# Patient Record
Sex: Female | Born: 1970
Health system: Southern US, Community
[De-identification: ages and names within clinical notes are randomized; demographics above are authoritative.]

## PROBLEM LIST (undated history)

## (undated) DIAGNOSIS — J309 Allergic rhinitis, unspecified: Secondary | ICD-10-CM

## (undated) DIAGNOSIS — K5900 Constipation, unspecified: Secondary | ICD-10-CM

## (undated) DIAGNOSIS — J45909 Unspecified asthma, uncomplicated: Secondary | ICD-10-CM

## (undated) DIAGNOSIS — IMO0001 Reserved for inherently not codable concepts without codable children: Secondary | ICD-10-CM

## (undated) DIAGNOSIS — F419 Anxiety disorder, unspecified: Secondary | ICD-10-CM

## (undated) DIAGNOSIS — K219 Gastro-esophageal reflux disease without esophagitis: Secondary | ICD-10-CM

## (undated) DIAGNOSIS — J449 Chronic obstructive pulmonary disease, unspecified: Secondary | ICD-10-CM

## (undated) DIAGNOSIS — R319 Hematuria, unspecified: Secondary | ICD-10-CM

## (undated) DIAGNOSIS — G4700 Insomnia, unspecified: Secondary | ICD-10-CM

## (undated) DIAGNOSIS — F329 Major depressive disorder, single episode, unspecified: Secondary | ICD-10-CM

## (undated) DIAGNOSIS — F32A Depression, unspecified: Secondary | ICD-10-CM

## (undated) HISTORY — DX: Insomnia, unspecified: G47.00

## (undated) HISTORY — PX: OTHER SURGICAL HISTORY: SHX169

## (undated) HISTORY — PX: TUBAL LIGATION: SHX77

## (undated) HISTORY — PX: TONSILLECTOMY: SHX5217

## (undated) HISTORY — DX: Anxiety disorder, unspecified: F41.9

## (undated) HISTORY — DX: Allergic rhinitis, unspecified: J30.9

## (undated) HISTORY — DX: Reserved for inherently not codable concepts without codable children: IMO0001

## (undated) HISTORY — PX: APPENDECTOMY: SHX54

## (undated) HISTORY — DX: Constipation, unspecified: K59.00

## (undated) HISTORY — DX: Depression, unspecified: F32.A

## (undated) HISTORY — DX: Unspecified asthma, uncomplicated: J45.909

## (undated) HISTORY — PX: ABDOMINAL HYSTERECTOMY: SHX81

## (undated) HISTORY — DX: Gastro-esophageal reflux disease without esophagitis: K21.9

## (undated) HISTORY — DX: Hematuria, unspecified: R31.9

## (undated) HISTORY — DX: Major depressive disorder, single episode, unspecified: F32.9

---

## 2001-12-25 HISTORY — PX: OVARIAN CYST DRAINAGE: SHX325

## 2004-04-19 ENCOUNTER — Ambulatory Visit: Payer: Self-pay | Admitting: Gastroenterology

## 2006-04-10 ENCOUNTER — Ambulatory Visit: Payer: Self-pay | Admitting: Internal Medicine

## 2006-04-17 ENCOUNTER — Ambulatory Visit: Payer: Self-pay | Admitting: Internal Medicine

## 2006-10-12 ENCOUNTER — Ambulatory Visit: Payer: Self-pay | Admitting: Unknown Physician Specialty

## 2006-10-15 ENCOUNTER — Ambulatory Visit: Payer: Self-pay | Admitting: Unknown Physician Specialty

## 2006-10-27 ENCOUNTER — Observation Stay: Payer: Self-pay | Admitting: Unknown Physician Specialty

## 2007-05-25 ENCOUNTER — Ambulatory Visit: Payer: Self-pay | Admitting: Internal Medicine

## 2008-01-14 ENCOUNTER — Ambulatory Visit: Payer: Self-pay | Admitting: Internal Medicine

## 2008-11-16 ENCOUNTER — Ambulatory Visit: Payer: Self-pay | Admitting: Unknown Physician Specialty

## 2009-10-15 ENCOUNTER — Ambulatory Visit: Payer: Self-pay | Admitting: Gastroenterology

## 2009-10-15 LAB — HM COLONOSCOPY

## 2009-11-20 ENCOUNTER — Ambulatory Visit: Payer: Self-pay | Admitting: Unknown Physician Specialty

## 2011-01-29 ENCOUNTER — Ambulatory Visit: Payer: Self-pay | Admitting: Unknown Physician Specialty

## 2011-01-31 ENCOUNTER — Ambulatory Visit: Payer: Self-pay | Admitting: Unknown Physician Specialty

## 2012-02-20 ENCOUNTER — Ambulatory Visit: Payer: Self-pay | Admitting: Obstetrics and Gynecology

## 2012-03-04 ENCOUNTER — Ambulatory Visit: Payer: Self-pay | Admitting: Obstetrics and Gynecology

## 2012-03-04 LAB — CBC
MCH: 31.2 pg (ref 26.0–34.0)
MCHC: 33.8 g/dL (ref 32.0–36.0)
MCV: 92 fL (ref 80–100)
RBC: 4.26 10*6/uL (ref 3.80–5.20)
RDW: 12.9 % (ref 11.5–14.5)
WBC: 5.6 10*3/uL (ref 3.6–11.0)

## 2012-03-04 LAB — BASIC METABOLIC PANEL
BUN: 11 mg/dL (ref 7–18)
Calcium, Total: 9.5 mg/dL (ref 8.5–10.1)
Co2: 26 mmol/L (ref 21–32)
Glucose: 89 mg/dL (ref 65–99)
Osmolality: 275 (ref 275–301)
Sodium: 138 mmol/L (ref 136–145)

## 2012-03-12 ENCOUNTER — Ambulatory Visit: Payer: Self-pay | Admitting: Obstetrics and Gynecology

## 2012-03-16 LAB — PATHOLOGY REPORT

## 2013-02-23 ENCOUNTER — Ambulatory Visit: Payer: Self-pay | Admitting: Obstetrics and Gynecology

## 2013-11-21 ENCOUNTER — Ambulatory Visit: Payer: Self-pay | Admitting: Family Medicine

## 2014-09-28 ENCOUNTER — Other Ambulatory Visit: Payer: Self-pay | Admitting: Family Medicine

## 2014-09-29 NOTE — Telephone Encounter (Signed)
Routing to provider  

## 2014-09-30 ENCOUNTER — Other Ambulatory Visit: Payer: Self-pay | Admitting: Family Medicine

## 2014-10-09 ENCOUNTER — Other Ambulatory Visit: Payer: Self-pay | Admitting: Unknown Physician Specialty

## 2014-10-10 NOTE — Telephone Encounter (Signed)
Your patient.  Thanks 

## 2014-10-10 NOTE — Telephone Encounter (Signed)
Appt please, last visit was March 2016; 6 month f/u will be due soon

## 2014-11-14 DIAGNOSIS — F32A Depression, unspecified: Secondary | ICD-10-CM | POA: Insufficient documentation

## 2014-11-14 DIAGNOSIS — K5900 Constipation, unspecified: Secondary | ICD-10-CM | POA: Insufficient documentation

## 2014-11-14 DIAGNOSIS — J309 Allergic rhinitis, unspecified: Secondary | ICD-10-CM | POA: Insufficient documentation

## 2014-11-14 DIAGNOSIS — K219 Gastro-esophageal reflux disease without esophagitis: Secondary | ICD-10-CM | POA: Insufficient documentation

## 2014-11-14 DIAGNOSIS — G4700 Insomnia, unspecified: Secondary | ICD-10-CM | POA: Insufficient documentation

## 2014-11-14 DIAGNOSIS — F419 Anxiety disorder, unspecified: Secondary | ICD-10-CM | POA: Insufficient documentation

## 2014-11-14 DIAGNOSIS — F329 Major depressive disorder, single episode, unspecified: Secondary | ICD-10-CM | POA: Insufficient documentation

## 2014-11-14 DIAGNOSIS — R319 Hematuria, unspecified: Secondary | ICD-10-CM | POA: Insufficient documentation

## 2014-11-14 DIAGNOSIS — J45909 Unspecified asthma, uncomplicated: Secondary | ICD-10-CM | POA: Insufficient documentation

## 2014-11-16 ENCOUNTER — Encounter: Payer: Self-pay | Admitting: Family Medicine

## 2014-11-16 ENCOUNTER — Ambulatory Visit (INDEPENDENT_AMBULATORY_CARE_PROVIDER_SITE_OTHER): Payer: BLUE CROSS/BLUE SHIELD | Admitting: Family Medicine

## 2014-11-16 VITALS — BP 115/74 | HR 62 | Temp 97.3°F | Wt 179.0 lb

## 2014-11-16 DIAGNOSIS — J3089 Other allergic rhinitis: Secondary | ICD-10-CM | POA: Diagnosis not present

## 2014-11-16 DIAGNOSIS — G4700 Insomnia, unspecified: Secondary | ICD-10-CM

## 2014-11-16 DIAGNOSIS — F329 Major depressive disorder, single episode, unspecified: Secondary | ICD-10-CM

## 2014-11-16 DIAGNOSIS — E538 Deficiency of other specified B group vitamins: Secondary | ICD-10-CM

## 2014-11-16 DIAGNOSIS — R42 Dizziness and giddiness: Secondary | ICD-10-CM | POA: Diagnosis not present

## 2014-11-16 DIAGNOSIS — J4530 Mild persistent asthma, uncomplicated: Secondary | ICD-10-CM

## 2014-11-16 DIAGNOSIS — F32A Depression, unspecified: Secondary | ICD-10-CM

## 2014-11-16 DIAGNOSIS — F419 Anxiety disorder, unspecified: Secondary | ICD-10-CM | POA: Diagnosis not present

## 2014-11-16 MED ORDER — ESCITALOPRAM OXALATE 10 MG PO TABS: 10.0000 mg | ORAL_TABLET | Freq: Every day | ORAL | Status: DC

## 2014-11-16 MED ORDER — BUPROPION HCL ER (XL) 150 MG PO TB24: 150.0000 mg | ORAL_TABLET | Freq: Every day | ORAL | Status: DC

## 2014-11-16 NOTE — Patient Instructions (Addendum)
Please decrease the gabapentin from two in the evening to one in the evening for one week, then stop Talk with your allergy doctor if allergy symptoms persist Continue the same antidepressant medicines (but we'll keep escitalopram at 10 mg) Never ever drink alcohol within six hours of the lorazepam or Ambien (zolpidem) Return in 6 months, sooner if needed

## 2014-11-16 NOTE — Progress Notes (Signed)
BP 115/74 mmHg  Pulse 62  Temp(Src) 97.3 F (36.3 C)  Wt 179 lb (81.194 kg)  SpO2 99%  Subjective:    Patient ID: [name], female    DOB: 11/13/1970, 44 y.o.   MRN: [id number]  HPI: [name] is a 44 y.o. female  Chief Complaint  Patient presents with  . Depression    needs refills on Wellbutrin and Escitalopram  . Anxiety  . Dizziness    occasionally, would like to have some labs drawn.   She needs refills of her medicines; the combination of the medicine is "great"; she thinks that being on 10 mg of lexapro and continue the same wellbutrin; has been out of the extra 5 mg for 2 months Depression screen [facility] 2/9 11/16/2014  Decreased Interest 0  Down, Depressed, Hopeless 0  PHQ - 2 Score 0  doing pretty well with anxiety  Sleep is okay; she gets ambien from allergy/asthma doctor; she knows that the max recommended dose is 5 mg and she talked to the doctor about it; she takes lorazepam and only uses those maybe once a month, can't remember the last time she used it; like a security blanket; she drinks socially but never with these medicine  She is here for f/u; she has had some sinus issues; ears have stopped up for weeks; takes sudafed and zyrtec; ears are stopped up and ringing; not blowing out yellow stuff; sneezing; taking allergy shots; she thought maybe a few weeks ago she didn't eat soon enough, but it's been doing it regardless; thinks maybe sinus more than eating issues; no fam of diabetes  She does not think the neurontin is working well; only uses it occasionally  B12 low; taking supplement  Relevant past medical, surgical, family and social history reviewed and updated as indicated. Interim medical history since our last visit reviewed. Allergies and medications reviewed and updated.  Review of Systems Per HPI unless specifically indicated above     Objective:    BP 115/74 mmHg  Pulse 62  Temp(Src) 97.3 F (36.3 C)  Wt 179 lb (81.194 kg)   SpO2 99%  Wt Readings from Last 3 Encounters:  11/16/14 179 lb (81.194 kg)  04/26/14 171 lb (77.565 kg)    Physical Exam  Constitutional: She appears well-developed and well-nourished. No distress.  HENT:  Head: Normocephalic and atraumatic.  Eyes: EOM are normal. No scleral icterus.  Neck: No thyromegaly present.  Cardiovascular: Normal rate, regular rhythm and normal heart sounds.   No murmur heard. Pulmonary/Chest: Effort normal and breath sounds normal. No respiratory distress. She has no wheezes.  Abdominal: Soft. She exhibits no distension.  Musculoskeletal: Normal range of motion. She exhibits no edema.  Neurological: She is alert. She exhibits normal muscle tone.  Skin: Skin is warm and dry. She is not diaphoretic. No pallor.  Psychiatric: She has a normal mood and affect. Her behavior is normal. Judgment and thought content normal.    Results for orders placed or performed in visit on 11/14/14  HM COLONOSCOPY  Result Value Ref Range   HM Colonoscopy per PP       Assessment & Plan:   Problem List Items Addressed This Visit      Respiratory   Asthma    F/u with allergy/asthma doctor      Allergic rhinitis    F/u with allergy/asthma doctor        Other   Depression - Primary    Continue SSRI;  well-controlled on just 10 mg now for the last two months; continue that dose; refills provided; f/u in 6 months, sooner if needed      Relevant Medications   buPROPion (WELLBUTRIN XL) 150 MG 24 hr tablet   escitalopram (LEXAPRO) 10 MG tablet   Anxiety    Rare use of benzodiazepine; discussed again with patient that she should not take this medicine within six hours of the Ambien (zolpidem) that her allergy/asthma doctor gives her; risk of unintentional overdose; she knows to use benzo for truly stormy moments, not dealing with life's day to day stresses; limited Rx may be approved before her next appt in 6 months if needed; no concern for misuse or diversion       Relevant Medications   buPROPion (WELLBUTRIN XL) 150 MG 24 hr tablet   escitalopram (LEXAPRO) 10 MG tablet   Insomnia    The Ambien (zolpidem) is prescribed by another provider; I do not prescribe 10 mg for women, and she is aware that the dose she is taking is above the recommended amount; she is aware of risk of unintentional overdose if mixed with other medicines, do NOT take within six hours of any benzo or pain pill      Vitamin B12 deficiency (non anemic)    Supplementation recommended      Dizziness    Reviewed hx, no physical signs to suggest worrisome problem; suggested she talk with her allergy doctor if sinus or allergy issue; call if symptoms persist; reviewed previous labs; we agreed no additional labs needed today          Follow up plan: Return in about 6 months (around 05/16/2015) for mood.  Face-to-face time with patient was more than 25 minutes, >50% time spent counseling and coordination of care  An after-visit summary was printed and given to the patient at [place].  Please see the patient instructions which may contain other information and recommendations beyond what is mentioned above in the assessment and plan. Meds ordered this encounter  Medications  . DISCONTD: Vitamin D, Ergocalciferol, (DRISDOL) 50000 UNITS CAPS capsule    Sig: Take by mouth daily.    Refill:  0  . buPROPion (WELLBUTRIN XL) 150 MG 24 hr tablet    Sig: Take 1 tablet (150 mg total) by mouth daily.    Dispense:  30 tablet    Refill:  6  . escitalopram (LEXAPRO) 10 MG tablet    Sig: Take 1 tablet (10 mg total) by mouth daily.    Dispense:  30 tablet    Refill:  6  Staff entered "50,000 iu vit D daily" in med list; see separate note; patient should NOT be taking that much; not sure if entry error and it's 1,000 iu daily or perhaps 50,000 iu monthly from another provider, but I'll have staff clear that up

## 2014-11-18 ENCOUNTER — Telehealth: Payer: Self-pay | Admitting: Family Medicine

## 2014-11-18 DIAGNOSIS — E538 Deficiency of other specified B group vitamins: Secondary | ICD-10-CM | POA: Insufficient documentation

## 2014-11-18 DIAGNOSIS — R42 Dizziness and giddiness: Secondary | ICD-10-CM | POA: Insufficient documentation

## 2014-11-18 NOTE — Assessment & Plan Note (Signed)
Reviewed hx, no physical signs to suggest worrisome problem; suggested she talk with her allergy doctor if sinus or allergy issue; call if symptoms persist; reviewed previous labs; we agreed no additional labs needed today

## 2014-11-18 NOTE — Assessment & Plan Note (Signed)
Continue SSRI; well-controlled on just 10 mg now for the last two months; continue that dose; refills provided; f/u in 6 months, sooner if needed

## 2014-11-18 NOTE — Assessment & Plan Note (Signed)
Supplementation recommended

## 2014-11-18 NOTE — Telephone Encounter (Signed)
Please call patient and clarify the vitamin D issue (if Rx of 50,000 iu, who is prescribing and how often is she taking it? If OTC, how much and how often?) She should NOT be taking 50,000 iu of vit D daily That was in her med list and I did not catch that until I was finishing my note Saturday That is much too much; vit D can be toxic

## 2014-11-18 NOTE — Assessment & Plan Note (Signed)
The Ambien (zolpidem) is prescribed by another provider; I do not prescribe 10 mg for women, and she is aware that the dose she is taking is above the recommended amount; she is aware of risk of unintentional overdose if mixed with other medicines, do NOT take within six hours of any benzo or pain pill

## 2014-11-18 NOTE — Assessment & Plan Note (Signed)
F/u with allergy/asthma doctor

## 2014-11-18 NOTE — Assessment & Plan Note (Signed)
Rare use of benzodiazepine; discussed again with patient that she should not take this medicine within six hours of the Ambien (zolpidem) that her allergy/asthma doctor gives her; risk of unintentional overdose; she knows to use benzo for truly stormy moments, not dealing with life's day to day stresses; limited Rx may be approved before her next appt in 6 months if needed; no concern for misuse or diversion

## 2014-11-20 NOTE — Telephone Encounter (Signed)
That is a LOT of vitamin D I would suggest she contact her gynecologist (prescribe) and have her level checked Too much vitamin D can be bad and it needs to be monitored; usual daily dose is 1,000 iu daily (7,000 iu weekly)

## 2014-11-20 NOTE — Telephone Encounter (Signed)
I spoke with patient, she is only taking it once a week. She states she has been taking it for a year though. She was getting it thru [place]. I updated it in her med list.

## 2014-11-20 NOTE — Telephone Encounter (Signed)
Patient notified

## 2015-01-25 ENCOUNTER — Other Ambulatory Visit: Payer: Self-pay | Admitting: Obstetrics & Gynecology

## 2015-01-25 DIAGNOSIS — Z1231 Encounter for screening mammogram for malignant neoplasm of breast: Secondary | ICD-10-CM

## 2015-02-02 ENCOUNTER — Ambulatory Visit
Admission: RE | Admit: 2015-02-02 | Discharge: 2015-02-02 | Disposition: A | Discharge status: Home / Self Care | Payer: BLUE CROSS/BLUE SHIELD | Source: Ambulatory Visit | Attending: Obstetrics & Gynecology | Admitting: Obstetrics & Gynecology

## 2015-02-02 DIAGNOSIS — Z1231 Encounter for screening mammogram for malignant neoplasm of breast: Secondary | ICD-10-CM

## 2015-04-30 ENCOUNTER — Other Ambulatory Visit: Payer: Self-pay | Admitting: Internal Medicine

## 2015-04-30 DIAGNOSIS — J329 Chronic sinusitis, unspecified: Secondary | ICD-10-CM

## 2015-05-04 ENCOUNTER — Ambulatory Visit: Payer: BLUE CROSS/BLUE SHIELD | Attending: Internal Medicine

## 2015-05-18 ENCOUNTER — Encounter: Payer: Self-pay | Admitting: Family Medicine

## 2015-05-18 ENCOUNTER — Ambulatory Visit (INDEPENDENT_AMBULATORY_CARE_PROVIDER_SITE_OTHER): Payer: BLUE CROSS/BLUE SHIELD | Admitting: Family Medicine

## 2015-05-18 VITALS — BP 102/70 | HR 75 | Temp 97.0°F | Ht 67.7 in | Wt 179.0 lb

## 2015-05-18 DIAGNOSIS — F419 Anxiety disorder, unspecified: Secondary | ICD-10-CM | POA: Diagnosis not present

## 2015-05-18 DIAGNOSIS — F329 Major depressive disorder, single episode, unspecified: Secondary | ICD-10-CM

## 2015-05-18 DIAGNOSIS — J4530 Mild persistent asthma, uncomplicated: Secondary | ICD-10-CM

## 2015-05-18 DIAGNOSIS — Z79899 Other long term (current) drug therapy: Secondary | ICD-10-CM

## 2015-05-18 DIAGNOSIS — E538 Deficiency of other specified B group vitamins: Secondary | ICD-10-CM

## 2015-05-18 DIAGNOSIS — J3089 Other allergic rhinitis: Secondary | ICD-10-CM | POA: Diagnosis not present

## 2015-05-18 DIAGNOSIS — G4700 Insomnia, unspecified: Secondary | ICD-10-CM | POA: Diagnosis not present

## 2015-05-18 DIAGNOSIS — F32A Depression, unspecified: Secondary | ICD-10-CM

## 2015-05-18 MED ORDER — ESCITALOPRAM OXALATE 10 MG PO TABS: 10.0000 mg | ORAL_TABLET | Freq: Every day | ORAL | Status: DC

## 2015-05-18 MED ORDER — LORAZEPAM 1 MG PO TABS: 1.0000 mg | ORAL_TABLET | Freq: Four times a day (QID) | ORAL | Status: DC | PRN

## 2015-05-18 MED ORDER — BUPROPION HCL ER (XL) 150 MG PO TB24: 150.0000 mg | ORAL_TABLET | Freq: Every day | ORAL | Status: DC

## 2015-05-18 NOTE — Patient Instructions (Addendum)
Stop the paroxetine If that causes a little anxiety as you come off, you can take it every 36 hours or every 48 hours for a week or two and then stop Be aware of the risk of serotonin syndrome Take 1000 iu vitamin D3 daily Call with any issues or problems Request labs from gynecologist Return in 6 months or when you need a refill of lorazepam, otherwise every 12 months; use lorazepam sparingly

## 2015-05-18 NOTE — Progress Notes (Signed)
BP 102/70 mmHg  Pulse 75  Temp(Src) 97 F (36.1 C)  Ht 5' 7.7" (1.72 m)  Wt 179 lb (81.194 kg)  BMI 27.45 kg/m2  SpO2 97%   Subjective:    Patient ID: [name], female    DOB: 1970/08/07, 45 y.o.   MRN: [id number]  HPI: [name] is a 45 y.o. female  Chief Complaint  Patient presents with  . Depression    follow up,she is doing great on the med combo  . Anxiety    follow up   Depression screen [facility] 2/9 05/18/2015 11/16/2014  Decreased Interest 0 0  Down, Depressed, Hopeless 0 0  PHQ - 2 Score 0 0   Depression; she has found the right combination; she is on Paxil and Lexapro together; I started the Lexapro a long time ago; her GYN added Paxil in October or so, and the GYN already knew she was on Lexapro patient says; she was started on paroxetine for hot flashes, perimenopausal symptoms  GAD 7 : Generalized Anxiety Score 05/18/2015  Nervous, Anxious, on Edge 0  Control/stop worrying 0  Worry too much - different things 0  Trouble relaxing 0  Restless 0  Easily annoyed or irritable 0  Afraid - awful might happen 0  Total GAD 7 Score 0  Anxiety Difficulty Not difficult at all   Not having to use rescue lorazepam; still has some from last time; no alcohol and no sleeping pills with this  Dr. Humphrey Rolls prescribes the [place]; her other medicines keep her jacked up and make it hard for her to sleep; not doing any behaviors overnight like cooking; she is aware that there recommended dose was lowered to 5 mg and it does not work; no amnestic events; knows to never ever take lorazepam and ambien within six hours of each  Allergies, controlled with antihistamine and singulair; not doing nasal sprays, tried many kinds; doing allergy shots Rescue inhaler infrequently; used one her whole life; asthma her whole life since age [age]; sometimes limiting if pollen really bad; avoids triggers; has used Flovent, everything you can think of in the past; shots for allergies really helps;  [place]; sees Dr. Humphrey Rolls  Taking B12 vitamin, and that helps her feel better Finished up Rx vitamin D and not taking OTC Had CPE, sees GYN, had labs done not long ago  Relevant past medical, surgical, family, and social history reviewed and updated as indicated Past Medical History  Diagnosis Date  . Constipation   . Asthma   . Allergic rhinitis   . Depression   . Anxiety   . Hematuria   . Insomnia   . Reflux    Past Surgical History  Procedure Laterality Date  . Appendectomy    . Abdominal hysterectomy    . Tubal ligation    . Tonsillectomy    . Ovarian cyst drainage Right Nov. 2003   Family History  Problem Relation Age of Onset  . Cancer Mother     ovarian  . Stroke Father   . Cancer Maternal Uncle     lung  . Diabetes Maternal Grandmother   . Emphysema Maternal Grandfather   . Diabetes Paternal Grandmother   . Stroke Paternal Grandfather   . Breast cancer Neg Hx    Social History   Social History  . Marital Status: Divorced    Spouse Name: N/A  . Number of Children: N/A  . Years of Education: N/A   Occupational History  . Not  on file.   Social History Main Topics  . Smoking status: Never Smoker   . Smokeless tobacco: Never Used  . Alcohol Use: Yes     Comment: occasional  . Drug Use: No  . Sexual Activity: Not on file   Other Topics Concern  . Not on file   Social History Narrative    Interim medical history since our last visit reviewed; no medical excitement Allergies and medications reviewed and updated.  Review of Systems Per HPI unless specifically indicated above     Objective:    BP 102/70 mmHg  Pulse 75  Temp(Src) 97 F (36.1 C)  Ht 5' 7.7" (1.72 m)  Wt 179 lb (81.194 kg)  BMI 27.45 kg/m2  SpO2 97%  Wt Readings from Last 3 Encounters:  05/18/15 179 lb (81.194 kg)  11/16/14 179 lb (81.194 kg)  04/26/14 171 lb (77.565 kg)    Physical Exam  Constitutional: She appears well-developed and well-nourished. No distress.  Eyes:  No scleral icterus.  Neck: No JVD present.  Cardiovascular: Normal rate and regular rhythm.   Pulmonary/Chest: Effort normal and breath sounds normal.  Abdominal: She exhibits no distension.  Neurological: She is alert. She displays no tremor. Coordination and gait normal.  No tics  Skin: She is not diaphoretic. No pallor.  Psychiatric: She has a normal mood and affect. Her behavior is normal. Judgment and thought content normal.    Results for orders placed or performed in visit on 11/14/14  HM COLONOSCOPY  Result Value Ref Range   HM Colonoscopy per PP       Assessment & Plan:   Problem List Items Addressed This Visit      Respiratory   Asthma    Continue to f/u with asthma/allergy specialist; continue curren tmeds      Relevant Medications   montelukast (SINGULAIR) 10 MG tablet   Allergic rhinitis    Continue to f/u with asthma/allergy specialist; continue current meds        Other   Depression    I don't like that the patient is on two SSRIs; discussed risk of serotonin syndrome; patient education given; do NOT take tramadol; we discussed options; she opted to stop the other SSRI; will continue lexapro and monitor; I am hopeful that she won't need a dose adjustment, but we could increase that dose if needed; call if any problems; see AVS      Relevant Medications   escitalopram (LEXAPRO) 10 MG tablet   buPROPion (WELLBUTRIN XL) 150 MG 24 hr tablet   LORazepam (ATIVAN) 1 MG tablet   Anxiety - Primary    Discussion about benzo use; she knows to NOT drink alcohol or take any sleeping pills within six hours of this medicine; she uses benzo rarely; will allow one more prescription today and then we'll see her again when she needs more, expect about six months possibly, but see me sooner if refill needed before then; she agrees; continue the one SSRI      Relevant Medications   escitalopram (LEXAPRO) 10 MG tablet   buPROPion (WELLBUTRIN XL) 150 MG 24 hr tablet    LORazepam (ATIVAN) 1 MG tablet   Insomnia    She is on ambien prescribed by another doctor; I am aware of this; she is aware that the recommended dose for women was lowered to 5 mg, but she continues to take 10 mg as is prescribed; she is aware that I do NOT want her taking the benzo within  six hours of the sleeping pill; discussed risk of accidental overdose and death if taken together      Vitamin B12 deficiency (non anemic)    She is on supplementation      On selective serotonin reuptake inhibitor (SSRI) therapy    Discussed with patient, I don't like that she is on two separate SSRIs; will stop the one and have her continue the other; call if needed; risk of serotonin syndrome discussed, hand-out given to patient on s/s of serotonin syndrome         Follow up plan: Return in about 1 year (around 05/17/2016).  An after-visit summary was printed and given to the patient at [place].  Please see the patient instructions which may contain other information and recommendations beyond what is mentioned above in the assessment and plan.  Meds ordered this encounter  Medications  . B-D INS SYR ULTRAFINE 1CC/31G 31G X 5/16" 1 ML MISC    Sig: USE FOR ALLERGY INJECTION TWICE A WEEK    Refill:  0  . montelukast (SINGULAIR) 10 MG tablet    Sig: Take 10 mg by mouth daily.    Refill:  0  . DISCONTD: PARoxetine (PAXIL) 10 MG tablet    Sig: Take 10 mg by mouth daily.    Refill:  1  . cyanocobalamin 2000 MCG tablet    Sig: Take 2,000 mcg by mouth daily.  Marland Kitchen escitalopram (LEXAPRO) 10 MG tablet    Sig: Take 1 tablet (10 mg total) by mouth daily.    Dispense:  90 tablet    Refill:  3  . buPROPion (WELLBUTRIN XL) 150 MG 24 hr tablet    Sig: Take 1 tablet (150 mg total) by mouth daily.    Dispense:  90 tablet    Refill:  3  . LORazepam (ATIVAN) 1 MG tablet    Sig: Take 1 tablet (1 mg total) by mouth every 6 (six) hours as needed for anxiety. Do not take within 6 hours of taking sleeping pill.     Dispense:  30 tablet    Refill:  0

## 2015-05-24 NOTE — Assessment & Plan Note (Signed)
She is on ambien prescribed by another doctor; I am aware of this; she is aware that the recommended dose for women was lowered to 5 mg, but she continues to take 10 mg as is prescribed; she is aware that I do NOT want her taking the benzo within six hours of the sleeping pill; discussed risk of accidental overdose and death if taken together

## 2015-05-24 NOTE — Assessment & Plan Note (Signed)
Discussion about benzo use; she knows to NOT drink alcohol or take any sleeping pills within six hours of this medicine; she uses benzo rarely; will allow one more prescription today and then we'll see her again when she needs more, expect about six months possibly, but see me sooner if refill needed before then; she agrees; continue the one SSRI

## 2015-05-24 NOTE — Assessment & Plan Note (Signed)
I don't like that the patient is on two SSRIs; discussed risk of serotonin syndrome; patient education given; do NOT take tramadol; we discussed options; she opted to stop the other SSRI; will continue lexapro and monitor; I am hopeful that she won't need a dose adjustment, but we could increase that dose if needed; call if any problems; see AVS

## 2015-05-24 NOTE — Assessment & Plan Note (Signed)
Continue to f/u with asthma/allergy specialist; continue current meds

## 2015-05-24 NOTE — Assessment & Plan Note (Signed)
Discussed with patient, I don't like that she is on two separate SSRIs; will stop the one and have her continue the other; call if needed; risk of serotonin syndrome discussed, hand-out given to patient on s/s of serotonin syndrome

## 2015-05-24 NOTE — Assessment & Plan Note (Signed)
Continue to f/u with asthma/allergy specialist; continue curren tmeds

## 2015-05-24 NOTE — Assessment & Plan Note (Signed)
She is on supplementation

## 2015-08-02 ENCOUNTER — Other Ambulatory Visit: Payer: Self-pay | Admitting: Physician Assistant

## 2015-08-02 DIAGNOSIS — J329 Chronic sinusitis, unspecified: Secondary | ICD-10-CM

## 2015-08-09 ENCOUNTER — Ambulatory Visit
Admission: RE | Admit: 2015-08-09 | Discharge: 2015-08-09 | Disposition: A | Discharge status: Home / Self Care | Payer: BLUE CROSS/BLUE SHIELD | Source: Ambulatory Visit | Attending: Internal Medicine | Admitting: Internal Medicine

## 2015-08-09 DIAGNOSIS — J329 Chronic sinusitis, unspecified: Secondary | ICD-10-CM

## 2015-09-19 DIAGNOSIS — J301 Allergic rhinitis due to pollen: Secondary | ICD-10-CM | POA: Diagnosis not present

## 2015-11-28 ENCOUNTER — Ambulatory Visit (INDEPENDENT_AMBULATORY_CARE_PROVIDER_SITE_OTHER): Payer: BLUE CROSS/BLUE SHIELD | Admitting: Family Medicine

## 2015-11-28 ENCOUNTER — Encounter: Payer: Self-pay | Admitting: Family Medicine

## 2015-11-28 VITALS — BP 120/78 | HR 91 | Temp 99.0°F | Wt 181.0 lb

## 2015-11-28 DIAGNOSIS — J4521 Mild intermittent asthma with (acute) exacerbation: Secondary | ICD-10-CM | POA: Diagnosis not present

## 2015-11-28 DIAGNOSIS — J069 Acute upper respiratory infection, unspecified: Secondary | ICD-10-CM

## 2015-11-28 DIAGNOSIS — R0602 Shortness of breath: Secondary | ICD-10-CM | POA: Diagnosis not present

## 2015-11-28 DIAGNOSIS — R05 Cough: Secondary | ICD-10-CM | POA: Diagnosis not present

## 2015-11-28 MED ORDER — ALBUTEROL SULFATE (2.5 MG/3ML) 0.083% IN NEBU
2.5000 mg | INHALATION_SOLUTION | RESPIRATORY_TRACT | Status: AC | PRN
  Administered 2015-11-28: 2.5 mg via RESPIRATORY_TRACT

## 2015-11-28 MED ORDER — BENZONATATE 100 MG PO CAPS: 100.0000 mg | ORAL_CAPSULE | Freq: Two times a day (BID) | ORAL | 0 refills | Status: DC | PRN

## 2015-11-28 MED ORDER — PREDNISONE 20 MG PO TABS: 40.0000 mg | ORAL_TABLET | Freq: Every day | ORAL | 0 refills | Status: DC

## 2015-11-28 MED ORDER — HYDROCOD POLST-CPM POLST ER 10-8 MG/5ML PO SUER: 5.0000 mL | Freq: Two times a day (BID) | ORAL | 0 refills | Status: DC | PRN

## 2015-11-28 MED ORDER — AZITHROMYCIN 250 MG PO TABS: ORAL_TABLET | ORAL | 0 refills | Status: DC

## 2015-11-28 NOTE — Progress Notes (Signed)
BP 120/78   Pulse 91   Temp 99 F (37.2 C)   Wt 181 lb (82.1 kg)   SpO2 95%   BMI 27.77 kg/m    Subjective:    Patient ID: [name], female    DOB: 11/18/1970, 45 y.o.   MRN: [id number]  HPI: [name] is a 45 y.o. female  Chief Complaint  Patient presents with  . URI    Since Sunday, started with head congestion, now moving into chest.Productive cough, difficulty breathing, some sore throat. No fever. Tried mucinex, sudafed, benadryl, nyquil.   Patient presents with 5 day history of congestion, sore throat, and now chest tightness, productive cough, and SOB. Has hx of asthma that flares when she gets sick and feels like she is in a bad flare right now. Has been using her inhaler frequently the past few days. Today is the worst day yet and she is significantly SOB. Has tried mucinex, sudafed, benadryl, and nyquil.    Relevant past medical, surgical, family and social history reviewed and updated as indicated. Interim medical history since our last visit reviewed. Allergies and medications reviewed and updated.  Review of Systems  Constitutional: Negative.   HENT: Positive for congestion, sinus pressure and sore throat.   Respiratory: Positive for cough, chest tightness, shortness of breath and wheezing.   Cardiovascular: Negative.   Gastrointestinal: Negative.   Genitourinary: Negative.   Musculoskeletal: Negative.   Neurological: Negative.   Psychiatric/Behavioral: Negative.     Per HPI unless specifically indicated above     Objective:    BP 120/78   Pulse 91   Temp 99 F (37.2 C)   Wt 181 lb (82.1 kg)   SpO2 95%   BMI 27.77 kg/m   Wt Readings from Last 3 Encounters:  11/28/15 181 lb (82.1 kg)  05/18/15 179 lb (81.2 kg)  11/16/14 179 lb (81.2 kg)    Physical Exam  Constitutional: She is oriented to person, place, and time. She appears well-developed and well-nourished. No distress.  HENT:  Head: Atraumatic.  Eyes: Conjunctivae are  normal. No scleral icterus.  Neck: Normal range of motion. Neck supple.  Cardiovascular: Normal rate.   Pulmonary/Chest: No respiratory distress. She has wheezes (diffuse wheezes throughout).  Musculoskeletal: Normal range of motion.  Lymphadenopathy:    She has no cervical adenopathy.  Neurological: She is alert and oriented to person, place, and time.  Skin: Skin is warm and dry.  Psychiatric: She has a normal mood and affect. Her behavior is normal.  Nursing note and vitals reviewed.     Assessment & Plan:   Problem List Items Addressed This Visit      Respiratory   Asthma    Nebulizer treatment in office today with significant improvement of air movement. Prednisone burst given. Continue albuterol inhaler as needed.       Relevant Medications   predniSONE (DELTASONE) 20 MG tablet   albuterol (PROVENTIL) (2.5 MG/3ML) 0.083% nebulizer solution 2.5 mg (Completed)    Other Visit Diagnoses    Upper respiratory tract infection, unspecified type    -  Primary   Azithromycin, tessalon perles, and tussionex sent. Continue OTC medications as needed for symptomatic control. Rest, good hydration.    Relevant Medications   azithromycin (ZITHROMAX) 250 MG tablet   albuterol (PROVENTIL) (2.5 MG/3ML) 0.083% nebulizer solution 2.5 mg (Completed)       Follow up plan: Return if symptoms worsen or fail to improve.

## 2015-11-28 NOTE — Assessment & Plan Note (Signed)
Nebulizer treatment in office today with significant improvement of air movement. Prednisone burst given. Continue albuterol inhaler as needed.

## 2015-11-28 NOTE — Patient Instructions (Signed)
Follow up if no improvement 

## 2016-01-10 ENCOUNTER — Encounter: Payer: Self-pay | Admitting: Family Medicine

## 2016-01-10 ENCOUNTER — Ambulatory Visit (INDEPENDENT_AMBULATORY_CARE_PROVIDER_SITE_OTHER): Payer: BLUE CROSS/BLUE SHIELD | Admitting: Family Medicine

## 2016-01-10 ENCOUNTER — Other Ambulatory Visit: Payer: Self-pay

## 2016-01-10 VITALS — BP 122/79 | HR 71 | Temp 98.6°F | Wt 184.0 lb

## 2016-01-10 DIAGNOSIS — L02215 Cutaneous abscess of perineum: Secondary | ICD-10-CM

## 2016-01-10 MED ORDER — DOXYCYCLINE HYCLATE 100 MG PO TABS: 100.0000 mg | ORAL_TABLET | Freq: Two times a day (BID) | ORAL | 0 refills | Status: DC

## 2016-01-10 NOTE — Progress Notes (Signed)
   BP 122/79   Pulse 71   Temp 98.6 F (37 C)   Wt 184 lb (83.5 kg)   SpO2 99%   BMI 28.23 kg/m    Subjective:    Patient ID: [name], female    DOB: 1970-12-21, 45 y.o.   MRN: [id number]  HPI: [name] is a 45 y.o. female  Chief Complaint  Patient presents with  . Recurrent Skin Infections    started last week. between the legs, getting larger, painful, hurts to sit. Started bleeding today.    Patient presents with a painful abscess in her left perineal area that she first noticed about a week ago. States she gets these bumps occasionally and they typically go away on their own. This one has been worsening over the course of the week and is now very swollen, painful, and started draining this morning. Going on a long car trip tomorrow and can barely sit. Has not been putting anything on it.   Relevant past medical, surgical, family and social history reviewed and updated as indicated. Interim medical history since our last visit reviewed. Allergies and medications reviewed and updated.  Review of Systems  Constitutional: Negative.   HENT: Negative.   Respiratory: Negative.   Cardiovascular: Negative.   Gastrointestinal: Negative.   Genitourinary: Negative.   Musculoskeletal: Negative.   Skin: Positive for wound.  Neurological: Negative.   Psychiatric/Behavioral: Negative.     Per HPI unless specifically indicated above     Objective:    BP 122/79   Pulse 71   Temp 98.6 F (37 C)   Wt 184 lb (83.5 kg)   SpO2 99%   BMI 28.23 kg/m   Wt Readings from Last 3 Encounters:  01/10/16 184 lb (83.5 kg)  11/28/15 181 lb (82.1 kg)  05/18/15 179 lb (81.2 kg)    Physical Exam  Constitutional: She is oriented to person, place, and time. She appears well-developed and well-nourished. No distress.  HENT:  Head: Atraumatic.  Eyes: Conjunctivae are normal. Pupils are equal, round, and reactive to light. No scleral icterus.  Neck: Normal range of motion.  Neck supple.  Cardiovascular: Normal rate and normal heart sounds.   Pulmonary/Chest: Effort normal. No respiratory distress.  Musculoskeletal: Normal range of motion.  Neurological: She is alert and oriented to person, place, and time.  Skin: Skin is warm and dry.  3 cm fluctuant abscess in left perineal fold with surrounding erythema. Some dried bloody discharge around wound   Psychiatric: She has a normal mood and affect. Her behavior is normal.  Nursing note and vitals reviewed.  Procedure note: I and D of left perineal abscess Area was prepped with betadine and numbed with 3 cc of lidocaine with epinephrine. A small incision was made over center of abscess, and gauze pads were used to drain contents. After area was cleaned with alcohol pads, neosporin and gauze were packed over wound. Patient tolerated procedure well and there were no immediate complications.       Assessment & Plan:   Problem List Items Addressed This Visit    None    Visit Diagnoses    Perineal abscess    -  Primary   Abscess drained today and dressed with neosporin. Wound care discussed. Doxycyline sent. Follow up if worsening or no improvement       Follow up plan: Return if symptoms worsen or fail to improve.

## 2016-01-10 NOTE — Patient Instructions (Signed)
Follow up as needed

## 2016-01-22 DIAGNOSIS — J4530 Mild persistent asthma, uncomplicated: Secondary | ICD-10-CM | POA: Diagnosis not present

## 2016-01-22 DIAGNOSIS — J320 Chronic maxillary sinusitis: Secondary | ICD-10-CM | POA: Diagnosis not present

## 2016-01-22 DIAGNOSIS — J301 Allergic rhinitis due to pollen: Secondary | ICD-10-CM | POA: Diagnosis not present

## 2016-02-15 DIAGNOSIS — J301 Allergic rhinitis due to pollen: Secondary | ICD-10-CM | POA: Diagnosis not present

## 2016-02-26 ENCOUNTER — Other Ambulatory Visit: Payer: Self-pay | Admitting: Obstetrics & Gynecology

## 2016-02-26 DIAGNOSIS — Z8041 Family history of malignant neoplasm of ovary: Secondary | ICD-10-CM | POA: Diagnosis not present

## 2016-02-26 DIAGNOSIS — Z1231 Encounter for screening mammogram for malignant neoplasm of breast: Secondary | ICD-10-CM

## 2016-02-26 DIAGNOSIS — Z01419 Encounter for gynecological examination (general) (routine) without abnormal findings: Secondary | ICD-10-CM | POA: Diagnosis not present

## 2016-02-26 DIAGNOSIS — Z1273 Encounter for screening for malignant neoplasm of ovary: Secondary | ICD-10-CM | POA: Diagnosis not present

## 2016-02-29 ENCOUNTER — Encounter: Payer: Self-pay | Admitting: Family Medicine

## 2016-02-29 ENCOUNTER — Ambulatory Visit
Admission: RE | Admit: 2016-02-29 | Discharge: 2016-02-29 | Disposition: A | Discharge status: Home / Self Care | Payer: BLUE CROSS/BLUE SHIELD | Source: Ambulatory Visit | Attending: Obstetrics & Gynecology | Admitting: Obstetrics & Gynecology

## 2016-02-29 ENCOUNTER — Ambulatory Visit (INDEPENDENT_AMBULATORY_CARE_PROVIDER_SITE_OTHER): Payer: BLUE CROSS/BLUE SHIELD | Admitting: Family Medicine

## 2016-02-29 ENCOUNTER — Other Ambulatory Visit: Payer: Self-pay

## 2016-02-29 VITALS — BP 126/80 | Temp 98.3°F | Ht 68.0 in | Wt 183.0 lb

## 2016-02-29 DIAGNOSIS — Z0001 Encounter for general adult medical examination with abnormal findings: Secondary | ICD-10-CM | POA: Diagnosis not present

## 2016-02-29 DIAGNOSIS — Z0000 Encounter for general adult medical examination without abnormal findings: Secondary | ICD-10-CM | POA: Diagnosis not present

## 2016-02-29 DIAGNOSIS — Z1231 Encounter for screening mammogram for malignant neoplasm of breast: Secondary | ICD-10-CM

## 2016-02-29 DIAGNOSIS — F330 Major depressive disorder, recurrent, mild: Secondary | ICD-10-CM

## 2016-02-29 DIAGNOSIS — Z1329 Encounter for screening for other suspected endocrine disorder: Secondary | ICD-10-CM | POA: Diagnosis not present

## 2016-02-29 DIAGNOSIS — R928 Other abnormal and inconclusive findings on diagnostic imaging of breast: Secondary | ICD-10-CM | POA: Diagnosis not present

## 2016-02-29 DIAGNOSIS — S161XXA Strain of muscle, fascia and tendon at neck level, initial encounter: Secondary | ICD-10-CM

## 2016-02-29 DIAGNOSIS — J4520 Mild intermittent asthma, uncomplicated: Secondary | ICD-10-CM

## 2016-02-29 DIAGNOSIS — Z13220 Encounter for screening for lipoid disorders: Secondary | ICD-10-CM | POA: Diagnosis not present

## 2016-02-29 DIAGNOSIS — F419 Anxiety disorder, unspecified: Secondary | ICD-10-CM

## 2016-02-29 MED ORDER — CYCLOBENZAPRINE HCL 10 MG PO TABS: 10.0000 mg | ORAL_TABLET | Freq: Three times a day (TID) | ORAL | 0 refills | Status: DC | PRN

## 2016-02-29 NOTE — Progress Notes (Signed)
BP 126/80   Temp 98.3 F (36.8 C)   Ht 5\' 8"  (1.727 m)   Wt 183 lb (83 kg)   SpO2 98%   BMI 27.83 kg/m    Subjective:    Patient ID: [name], female    DOB: 11/29/1970, 46 y.o.   MRN: [id number]  HPI: [name] is a 46 y.o. female presenting on 02/29/2016 for comprehensive medical examination. Current medical complaints include:left neck aching pain for 2-2.5 weeks. Has happened in the past. Flexeril and heating pads have helped. No longer has any flexeril. Tried meloxicam with no relief.   Menopausal Symptoms: no  Depression Screen done today and results listed below:  Depression screen [facility] 2/9 02/29/2016 05/18/2015 11/16/2014  Decreased Interest 0 0 0  Down, Depressed, Hopeless 0 0 0  PHQ - 2 Score 0 0 0    The patient does not have a history of falls. I did not complete a risk assessment for falls. A plan of care for falls was not documented.   Past Medical History:  Past Medical History:  Diagnosis Date  . Allergic rhinitis   . Anxiety   . Asthma   . Constipation   . Depression   . Hematuria   . Insomnia   . Reflux     Surgical History:  Past Surgical History:  Procedure Laterality Date  . ABDOMINAL HYSTERECTOMY    . APPENDECTOMY    . OVARIAN CYST DRAINAGE Right Nov. 2003  . TONSILLECTOMY    . TUBAL LIGATION      Medications:  Current Outpatient Prescriptions on File Prior to Visit  Medication Sig  . B-D INS SYR ULTRAFINE 1CC/31G 31G X 5/16" 1 ML MISC USE FOR ALLERGY INJECTION TWICE A WEEK  . buPROPion (WELLBUTRIN XL) 150 MG 24 hr tablet Take 1 tablet (150 mg total) by mouth daily.  . cetirizine (ZYRTEC) 10 MG tablet Take 10 mg by mouth daily.  . cyanocobalamin 2000 MCG tablet Take 2,000 mcg by mouth daily.  Marland Kitchen DALIRESP 500 MCG TABS tablet Take 500 mg by mouth daily.  Marland Kitchen escitalopram (LEXAPRO) 10 MG tablet Take 1 tablet (10 mg total) by mouth daily.  . fluticasone (FLONASE) 50 MCG/ACT nasal spray instill 2 sprays into each nostril once daily    . montelukast (SINGULAIR) 10 MG tablet Take 10 mg by mouth daily.  . VENTOLIN HFA 108 (90 BASE) MCG/ACT inhaler inhale 2 puffs by mouth every 4 to 6 hours  . Vitamin D, Ergocalciferol, (DRISDOL) 50000 units CAPS capsule Take 50,000 Units by mouth once a week.  . zolpidem (AMBIEN) 10 MG tablet Take 10 mg by mouth at bedtime as needed.    No current facility-administered medications on file prior to visit.     Allergies:  Allergies  Allergen Reactions  . Penicillins Anaphylaxis  . Sulfa Antibiotics Other (See Comments)    Bad taste in mouth    Social History:  Social History   Social History  . Marital status: Divorced    Spouse name: N/A  . Number of children: N/A  . Years of education: N/A   Occupational History  . Not on file.   Social History Main Topics  . Smoking status: Never Smoker  . Smokeless tobacco: Never Used  . Alcohol use Yes     Comment: occasional  . Drug use: No  . Sexual activity: Not on file   Other Topics Concern  . Not on file   Social History Narrative  .  No narrative on file   History  Smoking Status  . Never Smoker  Smokeless Tobacco  . Never Used   History  Alcohol Use  . Yes    Comment: occasional    Family History:  Family History  Problem Relation Age of Onset  . Cancer Mother     ovarian  . Stroke Father   . Cancer Maternal Uncle     lung  . Diabetes Maternal Grandmother   . Emphysema Maternal Grandfather   . Diabetes Paternal Grandmother   . Stroke Paternal Grandfather   . Breast cancer Neg Hx     Past medical history, surgical history, medications, allergies, family history and social history reviewed with patient today and changes made to appropriate areas of the chart.   Review of Systems - General ROS: negative Psychological ROS: negative Ophthalmic ROS: negative Breast ROS: negative for breast lumps Respiratory ROS: no cough, shortness of breath, or wheezing Cardiovascular ROS: no chest pain or dyspnea on  exertion Gastrointestinal ROS: no abdominal pain, change in bowel habits, or black or bloody stools Genito-Urinary ROS: no dysuria, trouble voiding, or hematuria Musculoskeletal ROS: positive for - neck pain Neurological ROS: no TIA or stroke symptoms Dermatological ROS: negative All other ROS negative except what is listed above and in the HPI.      Objective:    BP 126/80   Temp 98.3 F (36.8 C)   Ht 5\' 8"  (1.727 m)   Wt 183 lb (83 kg)   SpO2 98%   BMI 27.83 kg/m   Wt Readings from Last 3 Encounters:  02/29/16 183 lb (83 kg)  01/10/16 184 lb (83.5 kg)  11/28/15 181 lb (82.1 kg)    Physical Exam  Constitutional: She is oriented to person, place, and time. She appears well-developed and well-nourished. No distress.  HENT:  Head: Atraumatic.  Right Ear: External ear normal.  Left Ear: External ear normal.  Nose: Nose normal.  Mouth/Throat: Oropharynx is clear and moist. No oropharyngeal exudate.  Eyes: Conjunctivae are normal. Pupils are equal, round, and reactive to light.  Neck: Normal range of motion. Neck supple. No thyromegaly present.  Cardiovascular: Normal rate, regular rhythm and normal heart sounds.   Pulmonary/Chest: Effort normal and breath sounds normal. No respiratory distress.  Breast exam completed recently at GYN  Abdominal: Soft. Bowel sounds are normal. She exhibits no distension. There is no tenderness.  Musculoskeletal: Normal range of motion.  Left trapezius ttp No cervical spine ttp ROM intact  Neurological: She is alert and oriented to person, place, and time.  Skin: Skin is warm and dry. No rash noted.  Psychiatric: She has a normal mood and affect. Her behavior is normal.  Vitals reviewed.     Assessment & Plan:   Problem List Items Addressed This Visit      Respiratory   Asthma    Under good control with prn albuterol. Continue current regimen        Other   Depression    Moods stable, no concerns. Continue current regimen       Relevant Medications   LORazepam (ATIVAN) 0.5 MG tablet   Anxiety    Doing very well with just occasional use of Ativan. Continue current regimen.       Relevant Medications   LORazepam (ATIVAN) 0.5 MG tablet    Other Visit Diagnoses    Annual physical exam    -  Primary   Await lab results, patient is fasting today  Relevant Orders   Comprehensive metabolic panel   UA/M w/rflx Culture, Routine   Screening for thyroid disorder       Relevant Orders   CBC with Differential/Platelet   TSH   Screening for hyperlipidemia       Relevant Orders   Lipid Panel w/o Chol/HDL Ratio   Acute strain of neck muscle, initial encounter       Flexeril sent, continue massage, epsom salt soaks, heat, tylenol and/or ibuprofen prn for pain.        Follow up plan: Return in about 1 year (around 02/28/2017) for annual exam.   LABORATORY TESTING:  - Pap smear: up to date  IMMUNIZATIONS:   - Tdap: Tetanus vaccination status reviewed: last tetanus booster within 10 years. - Influenza: Up to date  SCREENING: -Mammogram: ordered with GYN  - Colonoscopy: Up to date   PATIENT COUNSELING:   Advised to take 1 mg of folate supplement per day if capable of pregnancy.   Sexuality: Discussed sexually transmitted diseases, partner selection, use of condoms, avoidance of unintended pregnancy  and contraceptive alternatives.   Advised to avoid cigarette smoking.  I discussed with the patient that most people either abstain from alcohol or drink within safe limits (<=14/week and <=4 drinks/occasion for males, <=7/weeks and <= 3 drinks/occasion for females) and that the risk for alcohol disorders and other health effects rises proportionally with the number of drinks per week and how often a drinker exceeds daily limits.  Discussed cessation/primary prevention of drug use and availability of treatment for abuse.   Diet: Encouraged to adjust caloric intake to maintain  or achieve ideal body weight, to  reduce intake of dietary saturated fat and total fat, to limit sodium intake by avoiding high sodium foods and not adding table salt, and to maintain adequate dietary potassium and calcium preferably from fresh fruits, vegetables, and low-fat dairy products.    stressed the importance of regular exercise  Injury prevention: Discussed safety belts, safety helmets, smoke detector, smoking near bedding or upholstery.   Dental health: Discussed importance of regular tooth brushing, flossing, and dental visits.    NEXT PREVENTATIVE PHYSICAL DUE IN 1 YEAR. Return in about 1 year (around 02/28/2017) for annual exam.

## 2016-02-29 NOTE — Assessment & Plan Note (Signed)
Moods stable, no concerns. Continue current regimen

## 2016-02-29 NOTE — Patient Instructions (Signed)
Follow up as needed

## 2016-02-29 NOTE — Assessment & Plan Note (Signed)
Under good control with prn albuterol. Continue current regimen

## 2016-02-29 NOTE — Assessment & Plan Note (Signed)
Doing very well with just occasional use of Ativan. Continue current regimen.

## 2016-03-01 LAB — LIPID PANEL W/O CHOL/HDL RATIO
Cholesterol, Total: 238 mg/dL — ABNORMAL HIGH (ref 100–199)
HDL: 70 mg/dL (ref >39)
LDL CALC: 130 mg/dL — AB (ref 0–99)
TRIGLYCERIDES: 191 mg/dL — AB (ref 0–149)
VLDL Cholesterol Cal: 38 mg/dL (ref 5–40)

## 2016-03-01 LAB — COMPREHENSIVE METABOLIC PANEL
A/G RATIO: 2 (ref 1.2–2.2)
ALBUMIN: 4.6 g/dL (ref 3.5–5.5)
ALT: 11 IU/L (ref 0–32)
AST: 14 IU/L (ref 0–40)
Alkaline Phosphatase: 58 IU/L (ref 39–117)
BUN/Creatinine Ratio: 24 — ABNORMAL HIGH (ref 9–23)
BUN: 15 mg/dL (ref 6–24)
Bilirubin Total: 0.3 mg/dL (ref 0.0–1.2)
CALCIUM: 9.6 mg/dL (ref 8.7–10.2)
CO2: 24 mmol/L (ref 18–29)
CREATININE: 0.63 mg/dL (ref 0.57–1.00)
Chloride: 102 mmol/L (ref 96–106)
GFR calc Af Amer: 124 mL/min/{1.73_m2} (ref >59)
GFR, EST NON AFRICAN AMERICAN: 107 mL/min/{1.73_m2} (ref >59)
GLOBULIN, TOTAL: 2.3 g/dL (ref 1.5–4.5)
Glucose: 97 mg/dL (ref 65–99)
POTASSIUM: 4.7 mmol/L (ref 3.5–5.2)
SODIUM: 141 mmol/L (ref 134–144)
Total Protein: 6.9 g/dL (ref 6.0–8.5)

## 2016-03-01 LAB — CBC WITH DIFFERENTIAL/PLATELET
BASOS: 1 %
Basophils Absolute: 0.1 10*3/uL (ref 0.0–0.2)
EOS (ABSOLUTE): 0.1 10*3/uL (ref 0.0–0.4)
EOS: 2 %
HEMATOCRIT: 39.3 % (ref 34.0–46.6)
HEMOGLOBIN: 12.9 g/dL (ref 11.1–15.9)
IMMATURE GRANULOCYTES: 0 %
Immature Grans (Abs): 0 10*3/uL (ref 0.0–0.1)
LYMPHS ABS: 2.4 10*3/uL (ref 0.7–3.1)
Lymphs: 42 %
MCH: 30.6 pg (ref 26.6–33.0)
MCHC: 32.8 g/dL (ref 31.5–35.7)
MCV: 93 fL (ref 79–97)
MONOCYTES: 5 %
MONOS ABS: 0.3 10*3/uL (ref 0.1–0.9)
Neutrophils Absolute: 2.9 10*3/uL (ref 1.4–7.0)
Neutrophils: 50 %
Platelets: 246 10*3/uL (ref 150–379)
RBC: 4.21 x10E6/uL (ref 3.77–5.28)
RDW: 13.3 % (ref 12.3–15.4)
WBC: 5.8 10*3/uL (ref 3.4–10.8)

## 2016-03-01 LAB — TSH: TSH: 1.43 u[IU]/mL (ref 0.450–4.500)

## 2016-03-02 LAB — MICROSCOPIC EXAMINATION: Epithelial Cells (non renal): >10 /hpf — ABNORMAL HIGH (ref 0–10)

## 2016-03-02 LAB — UA/M W/RFLX CULTURE, ROUTINE
Bilirubin, UA: NEGATIVE
GLUCOSE, UA: NEGATIVE
Ketones, UA: NEGATIVE
Leukocytes, UA: NEGATIVE
NITRITE UA: NEGATIVE
SPEC GRAV UA: 1.025 (ref 1.005–1.030)
UUROB: 0.2 mg/dL (ref 0.2–1.0)
pH, UA: 5.5 (ref 5.0–7.5)

## 2016-03-02 LAB — URINE CULTURE, REFLEX

## 2016-03-03 ENCOUNTER — Other Ambulatory Visit: Payer: Self-pay | Admitting: Obstetrics & Gynecology

## 2016-03-03 ENCOUNTER — Encounter: Payer: Self-pay | Admitting: Family Medicine

## 2016-03-03 DIAGNOSIS — R928 Other abnormal and inconclusive findings on diagnostic imaging of breast: Secondary | ICD-10-CM

## 2016-03-06 DIAGNOSIS — J301 Allergic rhinitis due to pollen: Secondary | ICD-10-CM | POA: Diagnosis not present

## 2016-03-12 DIAGNOSIS — Z8041 Family history of malignant neoplasm of ovary: Secondary | ICD-10-CM | POA: Insufficient documentation

## 2016-03-19 ENCOUNTER — Ambulatory Visit
Admission: RE | Admit: 2016-03-19 | Discharge: 2016-03-19 | Disposition: A | Discharge status: Home / Self Care | Payer: BLUE CROSS/BLUE SHIELD | Source: Ambulatory Visit | Attending: Obstetrics & Gynecology | Admitting: Obstetrics & Gynecology

## 2016-03-19 DIAGNOSIS — Z8041 Family history of malignant neoplasm of ovary: Secondary | ICD-10-CM | POA: Insufficient documentation

## 2016-03-19 DIAGNOSIS — R928 Other abnormal and inconclusive findings on diagnostic imaging of breast: Secondary | ICD-10-CM | POA: Insufficient documentation

## 2016-03-19 DIAGNOSIS — N6489 Other specified disorders of breast: Secondary | ICD-10-CM | POA: Diagnosis not present

## 2016-04-16 DIAGNOSIS — Z8639 Personal history of other endocrine, nutritional and metabolic disease: Secondary | ICD-10-CM | POA: Diagnosis not present

## 2016-05-09 ENCOUNTER — Telehealth: Payer: Self-pay | Admitting: Family Medicine

## 2016-05-09 NOTE — Telephone Encounter (Signed)
Routing to provider  

## 2016-05-09 NOTE — Telephone Encounter (Signed)
Pt called and stated she thinks she has a sinus infection and would like to have something sent in to [facility].

## 2016-05-12 NOTE — Telephone Encounter (Signed)
Needs an appointment.

## 2016-05-12 NOTE — Telephone Encounter (Signed)
I spoke with patient, she said she is going to just try to fight it off and that if she doesn't improve, she will call back to schedule an appt.

## 2016-05-13 ENCOUNTER — Encounter: Payer: Self-pay | Admitting: Unknown Physician Specialty

## 2016-05-13 ENCOUNTER — Ambulatory Visit (INDEPENDENT_AMBULATORY_CARE_PROVIDER_SITE_OTHER): Payer: BLUE CROSS/BLUE SHIELD | Admitting: Unknown Physician Specialty

## 2016-05-13 VITALS — BP 144/88 | HR 63 | Temp 98.5°F | Wt 182.8 lb

## 2016-05-13 DIAGNOSIS — J0101 Acute recurrent maxillary sinusitis: Secondary | ICD-10-CM

## 2016-05-13 MED ORDER — AZITHROMYCIN 250 MG PO TABS: ORAL_TABLET | ORAL | 0 refills | Status: DC

## 2016-05-13 NOTE — Progress Notes (Signed)
BP (!) 144/88 (BP Location: Left Arm, Cuff Size: Normal)   Pulse 63   Temp 98.5 F (36.9 C)   Wt 182 lb 12.8 oz (82.9 kg)   SpO2 99%   BMI 27.79 kg/m    Subjective:    Patient ID: [name], female    DOB: September 03, 1970, 46 y.o.   MRN: [id number]  HPI: [name] is a 46 y.o. female  Chief Complaint  Patient presents with  . URI    pt states she has had congestion, pressure, cough, right ear pain, right side sore throat, and drainage for about a week and a half.    URI   This is a new (history of allergies and gets allergy shots) problem. Episode onset: 1 1/2 weeks ago. The problem has been gradually worsening. There has been no fever. Associated symptoms include congestion, coughing, ear pain, headaches, sinus pain and a sore throat. She has tried decongestant and antihistamine for the symptoms. The treatment provided mild relief.   Relevant past medical, surgical, family and social history reviewed and updated as indicated. Interim medical history since our last visit reviewed. Allergies and medications reviewed and updated.  Review of Systems  HENT: Positive for congestion, ear pain, sinus pain and sore throat.   Respiratory: Positive for cough.   Neurological: Positive for headaches.    Per HPI unless specifically indicated above     Objective:    BP (!) 144/88 (BP Location: Left Arm, Cuff Size: Normal)   Pulse 63   Temp 98.5 F (36.9 C)   Wt 182 lb 12.8 oz (82.9 kg)   SpO2 99%   BMI 27.79 kg/m   Wt Readings from Last 3 Encounters:  05/13/16 182 lb 12.8 oz (82.9 kg)  02/29/16 183 lb (83 kg)  01/10/16 184 lb (83.5 kg)    Physical Exam  Constitutional: She is oriented to person, place, and time. She appears well-developed and well-nourished. No distress.  HENT:  Head: Normocephalic and atraumatic.  Right Ear: Tympanic membrane and ear canal normal.  Left Ear: Tympanic membrane and ear canal normal.  Nose: No rhinorrhea. Right sinus exhibits  maxillary sinus tenderness. Right sinus exhibits no frontal sinus tenderness. Left sinus exhibits maxillary sinus tenderness. Left sinus exhibits no frontal sinus tenderness.  Eyes: Conjunctivae and lids are normal. Right eye exhibits no discharge. Left eye exhibits no discharge. No scleral icterus.  Cardiovascular: Normal rate and regular rhythm.   Pulmonary/Chest: Effort normal and breath sounds normal. No respiratory distress.  Abdominal: Normal appearance. There is no splenomegaly or hepatomegaly.  Musculoskeletal: Normal range of motion.  Neurological: She is alert and oriented to person, place, and time.  Skin: Skin is intact. No rash noted. No pallor.  Psychiatric: She has a normal mood and affect. Her behavior is normal. Judgment and thought content normal.    Results for orders placed or performed in visit on 02/29/16  Microscopic Examination  Result Value Ref Range   WBC, UA 0-5 0 - 5 /hpf   RBC, UA 0-2 0 - 2 /hpf   Epithelial Cells (non renal) >10 (H) 0 - 10 /hpf   Mucus, UA Present (A) Not Estab.   Bacteria, UA Many (A) None seen/Few  CBC with Differential/Platelet  Result Value Ref Range   WBC 5.8 3.4 - 10.8 x10E3/uL   RBC 4.21 3.77 - 5.28 x10E6/uL   Hemoglobin 12.9 11.1 - 15.9 g/dL   Hematocrit 39.3 34.0 - 46.6 %   MCV 93  79 - 97 fL   MCH 30.6 26.6 - 33.0 pg   MCHC 32.8 31.5 - 35.7 g/dL   RDW 13.3 12.3 - 15.4 %   Platelets 246 150 - 379 x10E3/uL   Neutrophils 50 Not Estab. %   Lymphs 42 Not Estab. %   Monocytes 5 Not Estab. %   Eos 2 Not Estab. %   Basos 1 Not Estab. %   Neutrophils Absolute 2.9 1.4 - 7.0 x10E3/uL   Lymphocytes Absolute 2.4 0.7 - 3.1 x10E3/uL   Monocytes Absolute 0.3 0.1 - 0.9 x10E3/uL   EOS (ABSOLUTE) 0.1 0.0 - 0.4 x10E3/uL   Basophils Absolute 0.1 0.0 - 0.2 x10E3/uL   Immature Granulocytes 0 Not Estab. %   Immature Grans (Abs) 0.0 0.0 - 0.1 x10E3/uL  Comprehensive metabolic panel  Result Value Ref Range   Glucose 97 65 - 99 mg/dL   BUN 15  6 - 24 mg/dL   Creatinine, Ser 0.63 0.57 - 1.00 mg/dL   GFR calc non Af Amer 107 >59 mL/min/1.73   GFR calc Af Amer 124 >59 mL/min/1.73   BUN/Creatinine Ratio 24 (H) 9 - 23   Sodium 141 134 - 144 mmol/L   Potassium 4.7 3.5 - 5.2 mmol/L   Chloride 102 96 - 106 mmol/L   CO2 24 18 - 29 mmol/L   Calcium 9.6 8.7 - 10.2 mg/dL   Total Protein 6.9 6.0 - 8.5 g/dL   Albumin 4.6 3.5 - 5.5 g/dL   Globulin, Total 2.3 1.5 - 4.5 g/dL   Albumin/Globulin Ratio 2.0 1.2 - 2.2   Bilirubin Total 0.3 0.0 - 1.2 mg/dL   Alkaline Phosphatase 58 39 - 117 IU/L   AST 14 0 - 40 IU/L   ALT 11 0 - 32 IU/L  Lipid Panel w/o Chol/HDL Ratio  Result Value Ref Range   Cholesterol, Total 238 (H) 100 - 199 mg/dL   Triglycerides 191 (H) 0 - 149 mg/dL   HDL 70 >39 mg/dL   VLDL Cholesterol Cal 38 5 - 40 mg/dL   LDL Calculated 130 (H) 0 - 99 mg/dL  TSH  Result Value Ref Range   TSH 1.430 0.450 - 4.500 uIU/mL  UA/M w/rflx Culture, Routine  Result Value Ref Range   Specific Gravity, UA 1.025 1.005 - 1.030   pH, UA 5.5 5.0 - 7.5   Color, UA Yellow Yellow   Appearance Ur Clear Clear   Leukocytes, UA Negative Negative   Protein, UA Trace (A) Negative/Trace   Glucose, UA Negative Negative   Ketones, UA Negative Negative   RBC, UA 2+ (A) Negative   Bilirubin, UA Negative Negative   Urobilinogen, Ur 0.2 0.2 - 1.0 mg/dL   Nitrite, UA Negative Negative   Microscopic Examination See below:    Urinalysis Reflex Comment   Urine Culture, Routine  Result Value Ref Range   Urine Culture, Routine Final report    Urine Culture result 1 Comment       Assessment & Plan:   Problem List Items Addressed This Visit    None    Visit Diagnoses    Acute recurrent maxillary sinusitis    -  Primary   Relevant Medications   azithromycin (ZITHROMAX Z-PAK) 250 MG tablet       Follow up plan: Return if symptoms worsen or fail to improve.

## 2016-05-26 ENCOUNTER — Telehealth: Payer: Self-pay | Admitting: Unknown Physician Specialty

## 2016-05-26 MED ORDER — DOXYCYCLINE HYCLATE 100 MG PO TABS: 100.0000 mg | ORAL_TABLET | Freq: Two times a day (BID) | ORAL | 0 refills | Status: DC

## 2016-05-26 MED ORDER — CEFDINIR 300 MG PO CAPS: 300.0000 mg | ORAL_CAPSULE | Freq: Two times a day (BID) | ORAL | 0 refills | Status: DC

## 2016-05-26 NOTE — Telephone Encounter (Signed)
Routing to provider. Patient was seen 05/13/16.

## 2016-05-26 NOTE — Telephone Encounter (Signed)
Called and let patient know about new antibiotic being sent in.

## 2016-05-26 NOTE — Telephone Encounter (Signed)
Called pharmacy and asked them not to fill Moran and that it was sent in by mistake. Asked for them to only fill the doxycycline. Will call and let patient know.

## 2016-05-26 NOTE — Telephone Encounter (Signed)
Pt called and stated she wasn't feeling any better and would like to know if she could have something else sent to. She would like it sent to [facility].

## 2016-06-02 ENCOUNTER — Telehealth: Payer: Self-pay | Admitting: Family Medicine

## 2016-06-02 MED ORDER — ONDANSETRON 4 MG PO TBDP: 4.0000 mg | ORAL_TABLET | Freq: Three times a day (TID) | ORAL | 0 refills | Status: DC | PRN

## 2016-06-02 NOTE — Telephone Encounter (Signed)
Rx sent to her pharmacy 

## 2016-06-02 NOTE — Telephone Encounter (Signed)
Informed patient

## 2016-06-02 NOTE — Telephone Encounter (Signed)
Patient called because she has been so nauseated for 3 days with vomiting.  She is hoping to have something called in for her nausea or if there is something over the counter for this.  [place]

## 2016-06-05 ENCOUNTER — Other Ambulatory Visit: Payer: Self-pay | Admitting: Family Medicine

## 2016-07-29 DIAGNOSIS — J4530 Mild persistent asthma, uncomplicated: Secondary | ICD-10-CM | POA: Diagnosis not present

## 2016-07-29 DIAGNOSIS — G4700 Insomnia, unspecified: Secondary | ICD-10-CM | POA: Diagnosis not present

## 2016-07-29 DIAGNOSIS — J301 Allergic rhinitis due to pollen: Secondary | ICD-10-CM | POA: Diagnosis not present

## 2016-08-04 DIAGNOSIS — J301 Allergic rhinitis due to pollen: Secondary | ICD-10-CM | POA: Diagnosis not present

## 2016-08-05 DIAGNOSIS — J301 Allergic rhinitis due to pollen: Secondary | ICD-10-CM | POA: Diagnosis not present

## 2016-10-29 DIAGNOSIS — Z8639 Personal history of other endocrine, nutritional and metabolic disease: Secondary | ICD-10-CM | POA: Diagnosis not present

## 2016-11-12 DIAGNOSIS — M5414 Radiculopathy, thoracic region: Secondary | ICD-10-CM | POA: Diagnosis not present

## 2016-11-12 DIAGNOSIS — R51 Headache: Secondary | ICD-10-CM | POA: Diagnosis not present

## 2016-11-12 DIAGNOSIS — M9902 Segmental and somatic dysfunction of thoracic region: Secondary | ICD-10-CM | POA: Diagnosis not present

## 2016-11-12 DIAGNOSIS — M9901 Segmental and somatic dysfunction of cervical region: Secondary | ICD-10-CM | POA: Diagnosis not present

## 2016-11-13 DIAGNOSIS — M5414 Radiculopathy, thoracic region: Secondary | ICD-10-CM | POA: Diagnosis not present

## 2016-11-13 DIAGNOSIS — M9901 Segmental and somatic dysfunction of cervical region: Secondary | ICD-10-CM | POA: Diagnosis not present

## 2016-11-13 DIAGNOSIS — M9902 Segmental and somatic dysfunction of thoracic region: Secondary | ICD-10-CM | POA: Diagnosis not present

## 2016-11-13 DIAGNOSIS — R51 Headache: Secondary | ICD-10-CM | POA: Diagnosis not present

## 2016-12-18 DIAGNOSIS — M5414 Radiculopathy, thoracic region: Secondary | ICD-10-CM | POA: Diagnosis not present

## 2016-12-18 DIAGNOSIS — M9902 Segmental and somatic dysfunction of thoracic region: Secondary | ICD-10-CM | POA: Diagnosis not present

## 2016-12-18 DIAGNOSIS — M9901 Segmental and somatic dysfunction of cervical region: Secondary | ICD-10-CM | POA: Diagnosis not present

## 2016-12-18 DIAGNOSIS — R51 Headache: Secondary | ICD-10-CM | POA: Diagnosis not present

## 2017-01-05 DIAGNOSIS — R51 Headache: Secondary | ICD-10-CM | POA: Diagnosis not present

## 2017-01-05 DIAGNOSIS — M5414 Radiculopathy, thoracic region: Secondary | ICD-10-CM | POA: Diagnosis not present

## 2017-01-05 DIAGNOSIS — M9902 Segmental and somatic dysfunction of thoracic region: Secondary | ICD-10-CM | POA: Diagnosis not present

## 2017-01-05 DIAGNOSIS — M9901 Segmental and somatic dysfunction of cervical region: Secondary | ICD-10-CM | POA: Diagnosis not present

## 2017-02-02 DIAGNOSIS — J301 Allergic rhinitis due to pollen: Secondary | ICD-10-CM | POA: Diagnosis not present

## 2017-02-03 ENCOUNTER — Ambulatory Visit: Payer: Self-pay | Admitting: Internal Medicine

## 2017-02-04 ENCOUNTER — Ambulatory Visit: Payer: BLUE CROSS/BLUE SHIELD | Admitting: Internal Medicine

## 2017-02-04 ENCOUNTER — Encounter: Payer: Self-pay | Admitting: Internal Medicine

## 2017-02-04 ENCOUNTER — Ambulatory Visit (INDEPENDENT_AMBULATORY_CARE_PROVIDER_SITE_OTHER): Payer: BLUE CROSS/BLUE SHIELD

## 2017-02-04 VITALS — BP 130/70 | HR 72 | Resp 16 | Ht 68.0 in | Wt 188.0 lb

## 2017-02-04 DIAGNOSIS — J301 Allergic rhinitis due to pollen: Secondary | ICD-10-CM

## 2017-02-04 DIAGNOSIS — J209 Acute bronchitis, unspecified: Secondary | ICD-10-CM

## 2017-02-04 DIAGNOSIS — J440 Chronic obstructive pulmonary disease with acute lower respiratory infection: Secondary | ICD-10-CM | POA: Diagnosis not present

## 2017-02-04 DIAGNOSIS — J3089 Other allergic rhinitis: Secondary | ICD-10-CM | POA: Diagnosis not present

## 2017-02-04 DIAGNOSIS — J309 Allergic rhinitis, unspecified: Secondary | ICD-10-CM

## 2017-02-04 DIAGNOSIS — K219 Gastro-esophageal reflux disease without esophagitis: Secondary | ICD-10-CM

## 2017-02-04 DIAGNOSIS — J4520 Mild intermittent asthma, uncomplicated: Secondary | ICD-10-CM | POA: Diagnosis not present

## 2017-02-04 NOTE — Patient Instructions (Signed)
Allergic Rhinitis Allergic rhinitis is when the mucous membranes in the nose respond to allergens. Allergens are particles in the air that cause your body to have an allergic reaction. This causes you to release allergic antibodies. Through a chain of events, these eventually cause you to release histamine into the blood stream. Although meant to protect the body, it is this release of histamine that causes your discomfort, such as frequent sneezing, congestion, and an itchy, runny nose. What are the causes? Seasonal allergic rhinitis (hay fever) is caused by pollen allergens that may come from grasses, trees, and weeds. Year-round allergic rhinitis (perennial allergic rhinitis) is caused by allergens such as house dust mites, pet dander, and mold spores. What are the signs or symptoms?  Nasal stuffiness (congestion).  Itchy, runny nose with sneezing and tearing of the eyes. How is this diagnosed? Your health care provider can help you determine the allergen or allergens that trigger your symptoms. If you and your health care provider are unable to determine the allergen, skin or blood testing may be used. Your health care provider will diagnose your condition after taking your health history and performing a physical exam. Your health care provider may assess you for other related conditions, such as asthma, pink eye, or an ear infection. How is this treated? Allergic rhinitis does not have a cure, but it can be controlled by:  Medicines that block allergy symptoms. These may include allergy shots, nasal sprays, and oral antihistamines.  Avoiding the allergen. Hay fever may often be treated with antihistamines in pill or nasal spray forms. Antihistamines block the effects of histamine. There are over-the-counter medicines that may help with nasal congestion and swelling around the eyes. Check with your health care provider before taking or giving this medicine. If avoiding the allergen or the  medicine prescribed do not work, there are many new medicines your health care provider can prescribe. Stronger medicine may be used if initial measures are ineffective. Desensitizing injections can be used if medicine and avoidance does not work. Desensitization is when a patient is given ongoing shots until the body becomes less sensitive to the allergen. Make sure you follow up with your health care provider if problems continue. Follow these instructions at home: It is not possible to completely avoid allergens, but you can reduce your symptoms by taking steps to limit your exposure to them. It helps to know exactly what you are allergic to so that you can avoid your specific triggers. Contact a health care provider if:  You have a fever.  You develop a cough that does not stop easily (persistent).  You have shortness of breath.  You start wheezing.  Symptoms interfere with normal daily activities. This information is not intended to replace advice given to you by your health care provider. Make sure you discuss any questions you have with your health care provider. Document Released: 11/05/2000 Document Revised: 10/12/2015 Document Reviewed: 10/18/2012 [employer] Interactive Patient Education  2017 [employer]  

## 2017-02-04 NOTE — Progress Notes (Signed)
Pulmonary Critical Care  Initial Consult Note  [name] QPY:[id number] DOB: [birth year] DOA: (Not on file)   Chief Complaint: Ashtma  HPI: [name] is a 46 y.o. female  With asthma and allergies. She has been doing well overalll has been on her maintenance injectiosn with good response. NO flare up of asthma. She has needed her inhalers infrequently. She has had no admissions  Review of Systems:  Constitutional:  No weight loss, night sweats, Fevers, chills, fatigue.  HEENT:  No headaches, nasal congestion, post nasal drip,  Cardio-vascular:  No chest pain, Orthopnea, PND, swelling in lower extremities, anasarca, dizziness, palpitations  GI:  No heartburn, indigestion, abdominal pain, nausea, vomiting, diarrhea  Resp:  No shortness of breath with exertion or at rest. no productive cough, No coughing up of blood.No wheezing Skin:  no rash or lesions.  Musculoskeletal:  No joint pain or swelling.   Remainder ROS performed and is unremarkable other than noted in HPI  Past Medical History:  Diagnosis Date  . Allergic rhinitis   . Anxiety   . Asthma   . Constipation   . Depression   . Hematuria   . Insomnia   . Reflux    Past Surgical History:  Procedure Laterality Date  . ABDOMINAL HYSTERECTOMY    . APPENDECTOMY    . OVARIAN CYST DRAINAGE Right Nov. 2003  . TONSILLECTOMY    . TUBAL LIGATION     Social History:  reports that  has never smoked. she has never used smokeless tobacco. She reports that she drinks alcohol. She reports that she does not use drugs.  Allergies  Allergen Reactions  . Penicillins Anaphylaxis  . Sulfa Antibiotics Other (See Comments)    Bad taste in mouth    Family History  Problem Relation Age of Onset  . Cancer Mother        ovarian  . Stroke Father   . Cancer Maternal Uncle        lung  . Diabetes Maternal Grandmother   . Emphysema Maternal Grandfather   . Diabetes Paternal Grandmother   . Stroke Paternal Grandfather    . Breast cancer Neg Hx     Prior to Admission medications   Medication Sig Start Date End Date Taking? Authorizing Provider  azithromycin (ZITHROMAX Z-PAK) 250 MG tablet As directed Patient not taking: Reported on 02/04/2017 05/13/16   Kathrine Haddock, NP  B-D INS SYR ULTRAFINE 1CC/31G 31G X 5/16" 1 ML MISC USE FOR ALLERGY INJECTION TWICE A WEEK 04/04/15   [provider]  BLISOVI 24 FE 1-20 MG-MCG(24) tablet Take 1 tablet by mouth daily. 03/20/16   [provider]  buPROPion (WELLBUTRIN XL) 150 MG 24 hr tablet take 1 tablet by mouth once daily 06/05/16   Volney American, PA-C  cefdinir (OMNICEF) 300 MG capsule Take 1 capsule (300 mg total) by mouth 2 (two) times daily. Patient not taking: Reported on 02/04/2017 05/26/16   Kathrine Haddock, NP  cetirizine (ZYRTEC) 10 MG tablet Take 10 mg by mouth daily.    [provider]  cyanocobalamin 2000 MCG tablet Take 2,000 mcg by mouth daily.    [provider]  cyclobenzaprine (FLEXERIL) 10 MG tablet Take 1 tablet (10 mg total) by mouth 3 (three) times daily as needed for muscle spasms. 02/29/16   Volney American, PA-C  DALIRESP 500 MCG TABS tablet Take 500 mg by mouth daily. 09/08/14   [provider]  doxycycline (VIBRA-TABS) 100 MG tablet Take  1 tablet (100 mg total) by mouth 2 (two) times daily. This instead of Omnicef please Patient not taking: Reported on 02/04/2017 05/26/16   Kathrine Haddock, NP  escitalopram (LEXAPRO) 10 MG tablet take 1 tablet by mouth once daily 06/05/16   Volney American, PA-C  fluticasone [facility]) 50 MCG/ACT nasal spray instill 2 sprays into each nostril once daily 11/01/15   [provider]  LORazepam (ATIVAN) 0.5 MG tablet Take 0.5 mg by mouth every 8 (eight) hours as needed.    [provider]  montelukast (SINGULAIR) 10 MG tablet Take 10 mg by mouth daily. 05/15/15   [provider]  ondansetron (ZOFRAN ODT) 4 MG disintegrating tablet Take 1  tablet (4 mg total) by mouth every 8 (eight) hours as needed for nausea or vomiting. 06/02/16   Johnson, [name], DO  VENTOLIN HFA 108 (90 BASE) MCG/ACT inhaler inhale 2 puffs by mouth every 4 to 6 hours 08/11/14   [provider]  Vitamin D, Ergocalciferol, (DRISDOL) 50000 units CAPS capsule Take 50,000 Units by mouth once a week. 11/04/15   [provider]  zolpidem (AMBIEN) 10 MG tablet Take 10 mg by mouth at bedtime as needed.  09/07/14   [provider]   Physical Exam: Vitals:   02/04/17 1151  BP: 130/70  Pulse: 72  Resp: 16  SpO2: 98%  Weight: 188 lb (85.3 kg)  Height: 5\' 8"  (1.727 m)    Wt Readings from Last 3 Encounters:  02/04/17 188 lb (85.3 kg)  05/13/16 182 lb 12.8 oz (82.9 kg)  02/29/16 183 lb (83 kg)    General:  Appears calm and comfortable Eyes: PERRL, normal lids, irises & conjunctiva ENT: grossly normal hearing, lips & tongue Neck: no LAD, masses or thyromegaly Cardiovascular: RRR, no m/r/g. No LE edema. Respiratory: CTA bilaterally, no w/r/r.       Normal respiratory effort. Abdomen: soft, nontender Skin: no rash or induration seen on limited exam Musculoskeletal: grossly normal tone BUE/BLE Psychiatric: grossly normal mood and affect Neurologic: grossly non-focal.          Labs on Admission:  Basic Metabolic Panel: No results for input(s): NA, K, CL, CO2, GLUCOSE, BUN, CREATININE, CALCIUM, MG, PHOS in the last 168 hours. Liver Function Tests: No results for input(s): AST, ALT, ALKPHOS, BILITOT, PROT, ALBUMIN in the last 168 hours. No results for input(s): LIPASE, AMYLASE in the last 168 hours. No results for input(s): AMMONIA in the last 168 hours. CBC: No results for input(s): WBC, NEUTROABS, HGB, HCT, MCV, PLT in the last 168 hours. Cardiac Enzymes: No results for input(s): CKTOTAL, CKMB, CKMBINDEX, TROPONINI in the last 168 hours.  BNP (last 3 results) No results for input(s): BNP in the last 8760 hours.  ProBNP (last 3  results) No results for input(s): PROBNP in the last 8760 hours.  CBG: No results for input(s): GLUCAP in the last 168 hours.  Radiological Exams on Admission: No results found.  EKG: Independently reviewed.  Assessment/Plan Patient Active Problem List   Diagnosis Date Noted  . Vitamin B12 deficiency (non anemic) 11/18/2014  . Constipation   . Asthma   . Allergic rhinitis   . Depression   . Anxiety   . Hematuria   . Insomnia   . Reflux      1. Chronic Asthma Currently doing well will continue with her current regimen Will be on ventolin she is not sure about renewals at this time Will continue with daliresp  2. Allergic rhiniits  Will cojntinue with her allergy shots doing well Will return this afternoon for her allergy shot  3. GERD Well controlled   Code Status: full   Time spent: 10min  I have personally obtained a history, examined the patient, evaluated laboratory and imaging results, formulated the assessment and plan and placed orders.   Allyne Gee, MD [facility] Pulmonary Critical Care Medicine Sleep Medicine

## 2017-02-20 ENCOUNTER — Other Ambulatory Visit: Payer: Self-pay | Admitting: Nurse Practitioner

## 2017-02-20 DIAGNOSIS — F5101 Primary insomnia: Secondary | ICD-10-CM

## 2017-02-20 MED ORDER — ZOLPIDEM TARTRATE 10 MG PO TABS: 10.0000 mg | ORAL_TABLET | Freq: Every evening | ORAL | 0 refills | Status: DC | PRN

## 2017-02-20 NOTE — Progress Notes (Signed)
Sent new rx for zolpidem 10mg  qhs prn to [facility]

## 2017-02-26 ENCOUNTER — Encounter: Payer: Self-pay | Admitting: Family Medicine

## 2017-02-26 ENCOUNTER — Other Ambulatory Visit: Payer: Self-pay

## 2017-02-26 ENCOUNTER — Ambulatory Visit: Payer: BLUE CROSS/BLUE SHIELD | Admitting: Family Medicine

## 2017-02-26 VITALS — BP 124/83 | HR 86 | Temp 98.5°F | Wt 181.0 lb

## 2017-02-26 DIAGNOSIS — J0100 Acute maxillary sinusitis, unspecified: Secondary | ICD-10-CM | POA: Diagnosis not present

## 2017-02-26 MED ORDER — MONTELUKAST SODIUM 10 MG PO TABS: 10.0000 mg | ORAL_TABLET | Freq: Every day | ORAL | 5 refills | Status: DC

## 2017-02-26 MED ORDER — AZITHROMYCIN 250 MG PO TABS: ORAL_TABLET | ORAL | 0 refills | Status: DC

## 2017-02-26 MED ORDER — HYDROCOD POLST-CPM POLST ER 10-8 MG/5ML PO SUER: 5.0000 mL | Freq: Two times a day (BID) | ORAL | 0 refills | Status: DC | PRN

## 2017-02-26 NOTE — Progress Notes (Signed)
   BP 124/83   Pulse 86   Temp 98.5 F (36.9 C) (Oral)   Wt 181 lb (82.1 kg)   SpO2 98%   BMI 27.52 kg/m    Subjective:    Patient ID: Noelene Milana Huntsman, female    DOB: 09-22-1970, 47 y.o.   MRN: [id number]  HPI: Threasa Shantelle Alles is a 47 y.o. female  Chief Complaint  Patient presents with  . Sinus Problem    x 2 weeks.    Thick congestion, facial pain and pressure, ear pressure, sore throat, chills, sweats, low grade fevers x 2 weeks. Denies CP, SOB, myalgias. Taking sudafed, nyquil, mucinex, etc with no relief. Several sick contacts. Does have a hx of allergies and asthma. Regularly taking her allergy regimen.   Relevant past medical, surgical, family and social history reviewed and updated as indicated. Interim medical history since our last visit reviewed. Allergies and medications reviewed and updated.  Review of Systems  Constitutional: Positive for chills, diaphoresis, fatigue and fever.  HENT: Positive for congestion, ear pain, sinus pressure, sinus pain and sore throat.   Eyes: Negative.   Respiratory: Negative.   Cardiovascular: Negative.   Gastrointestinal: Negative.   Genitourinary: Negative.   Musculoskeletal: Negative.   Neurological: Negative.   Psychiatric/Behavioral: Negative.     Per HPI unless specifically indicated above     Objective:    BP 124/83   Pulse 86   Temp 98.5 F (36.9 C) (Oral)   Wt 181 lb (82.1 kg)   SpO2 98%   BMI 27.52 kg/m   Wt Readings from Last 3 Encounters:  02/26/17 181 lb (82.1 kg)  02/04/17 188 lb (85.3 kg)  05/13/16 182 lb 12.8 oz (82.9 kg)    Physical Exam  Constitutional: She is oriented to person, place, and time. She appears well-developed and well-nourished. No distress.  HENT:  Head: Atraumatic.  Right Ear: External ear normal.  Left Ear: External ear normal.  Mouth/Throat: No oropharyngeal exudate.  B/l maxillary sinuses ttp Oropharynx and nasal mucosa erythematous  Eyes: Conjunctivae are normal. Pupils  are equal, round, and reactive to light. No scleral icterus.  Neck: Normal range of motion. Neck supple.  Cardiovascular: Normal rate and normal heart sounds.  Pulmonary/Chest: Effort normal and breath sounds normal. No respiratory distress.  Musculoskeletal: Normal range of motion.  Lymphadenopathy:    She has no cervical adenopathy.  Neurological: She is alert and oriented to person, place, and time.  Skin: Skin is warm and dry.  Psychiatric: She has a normal mood and affect. Her behavior is normal.  Nursing note and vitals reviewed.     Assessment & Plan:   Problem List Items Addressed This Visit    None    Visit Diagnoses    Acute maxillary sinusitis, recurrence not specified    -  Primary   Will tx with zpack and tussionex. Supportive care reviewed. Discussed sedation precautions.    Relevant Medications   azithromycin (ZITHROMAX) 250 MG tablet   chlorpheniramine-HYDROcodone (TUSSIONEX PENNKINETIC ER) 10-8 MG/5ML SUER       Follow up plan: Return if symptoms worsen or fail to improve.

## 2017-02-27 ENCOUNTER — Other Ambulatory Visit: Payer: Self-pay

## 2017-02-27 MED ORDER — MONTELUKAST SODIUM 10 MG PO TABS: 10.0000 mg | ORAL_TABLET | Freq: Every day | ORAL | 5 refills | Status: DC

## 2017-03-01 ENCOUNTER — Other Ambulatory Visit: Payer: Self-pay | Admitting: Family Medicine

## 2017-03-01 NOTE — Patient Instructions (Signed)
Follow up as needed

## 2017-03-02 ENCOUNTER — Other Ambulatory Visit: Payer: Self-pay | Admitting: Obstetrics & Gynecology

## 2017-03-02 DIAGNOSIS — Z8639 Personal history of other endocrine, nutritional and metabolic disease: Secondary | ICD-10-CM | POA: Diagnosis not present

## 2017-03-02 DIAGNOSIS — Z1231 Encounter for screening mammogram for malignant neoplasm of breast: Secondary | ICD-10-CM

## 2017-03-02 DIAGNOSIS — Z01419 Encounter for gynecological examination (general) (routine) without abnormal findings: Secondary | ICD-10-CM | POA: Diagnosis not present

## 2017-03-02 DIAGNOSIS — Z8041 Family history of malignant neoplasm of ovary: Secondary | ICD-10-CM | POA: Diagnosis not present

## 2017-03-04 ENCOUNTER — Ambulatory Visit: Payer: BLUE CROSS/BLUE SHIELD | Admitting: Family Medicine

## 2017-03-04 ENCOUNTER — Telehealth: Payer: Self-pay | Admitting: Family Medicine

## 2017-03-04 LAB — HM PAP SMEAR: HM Pap smear: NEGATIVE

## 2017-03-04 NOTE — Telephone Encounter (Signed)
Pt requesting refill of Chlorpheniramine-Hydorcodone.

## 2017-03-04 NOTE — Telephone Encounter (Signed)
Copied from [place]. Topic: Quick Communication - Rx Refill/Question >> Mar 04, 2017  4:35 PM Patrice Paradise wrote: Medication:  chlorpheniramine-HYDROcodone (TUSSIONEX PENNKINETIC ER) 10-8 MG/5ML SUER   Has the patient contacted their pharmacy? Yes.    (Agent: If no, request that the patient contact the pharmacy for the refill.)  Preferred Pharmacy (with phone number or street name):  [facility], [address] [place] [address] Phone: [phone] Fax: [phone]   Agent: Please be advised that RX refills may take up to 3 business days. We ask that you follow-up with your pharmacy.

## 2017-03-05 ENCOUNTER — Ambulatory Visit: Payer: BLUE CROSS/BLUE SHIELD | Admitting: Family Medicine

## 2017-03-05 NOTE — Telephone Encounter (Signed)
Patient notified

## 2017-03-05 NOTE — Telephone Encounter (Signed)
We do not refill tussionex without appointments

## 2017-03-06 ENCOUNTER — Other Ambulatory Visit: Payer: Self-pay

## 2017-03-06 ENCOUNTER — Encounter: Payer: BLUE CROSS/BLUE SHIELD | Admitting: Family Medicine

## 2017-03-06 MED ORDER — VENTOLIN HFA 108 (90 BASE) MCG/ACT IN AERS: INHALATION_SPRAY | RESPIRATORY_TRACT | 3 refills | Status: DC

## 2017-03-18 ENCOUNTER — Other Ambulatory Visit: Payer: Self-pay | Admitting: Nurse Practitioner

## 2017-03-18 ENCOUNTER — Ambulatory Visit
Admission: RE | Admit: 2017-03-18 | Discharge: 2017-03-18 | Disposition: A | Discharge status: Home / Self Care | Payer: BLUE CROSS/BLUE SHIELD | Source: Ambulatory Visit | Attending: Obstetrics & Gynecology | Admitting: Obstetrics & Gynecology

## 2017-03-18 DIAGNOSIS — Z1231 Encounter for screening mammogram for malignant neoplasm of breast: Secondary | ICD-10-CM | POA: Insufficient documentation

## 2017-03-18 DIAGNOSIS — F5101 Primary insomnia: Secondary | ICD-10-CM

## 2017-03-18 MED ORDER — ZOLPIDEM TARTRATE 10 MG PO TABS: 10.0000 mg | ORAL_TABLET | Freq: Every evening | ORAL | 3 refills | Status: DC | PRN

## 2017-03-18 NOTE — Progress Notes (Signed)
Renewed ambien per pharmacy request.

## 2017-03-24 ENCOUNTER — Ambulatory Visit (INDEPENDENT_AMBULATORY_CARE_PROVIDER_SITE_OTHER): Payer: BLUE CROSS/BLUE SHIELD | Admitting: Family Medicine

## 2017-03-24 ENCOUNTER — Encounter: Payer: Self-pay | Admitting: Family Medicine

## 2017-03-24 VITALS — BP 115/75 | HR 71 | Temp 99.6°F | Wt 184.1 lb

## 2017-03-24 DIAGNOSIS — J4520 Mild intermittent asthma, uncomplicated: Secondary | ICD-10-CM

## 2017-03-24 DIAGNOSIS — Z0000 Encounter for general adult medical examination without abnormal findings: Secondary | ICD-10-CM | POA: Diagnosis not present

## 2017-03-24 DIAGNOSIS — Z0001 Encounter for general adult medical examination with abnormal findings: Secondary | ICD-10-CM

## 2017-03-24 DIAGNOSIS — K219 Gastro-esophageal reflux disease without esophagitis: Secondary | ICD-10-CM

## 2017-03-24 DIAGNOSIS — R319 Hematuria, unspecified: Secondary | ICD-10-CM

## 2017-03-24 DIAGNOSIS — F330 Major depressive disorder, recurrent, mild: Secondary | ICD-10-CM

## 2017-03-24 DIAGNOSIS — E559 Vitamin D deficiency, unspecified: Secondary | ICD-10-CM

## 2017-03-24 DIAGNOSIS — E538 Deficiency of other specified B group vitamins: Secondary | ICD-10-CM

## 2017-03-24 DIAGNOSIS — R5383 Other fatigue: Secondary | ICD-10-CM | POA: Diagnosis not present

## 2017-03-24 DIAGNOSIS — F419 Anxiety disorder, unspecified: Secondary | ICD-10-CM

## 2017-03-24 DIAGNOSIS — J0101 Acute recurrent maxillary sinusitis: Secondary | ICD-10-CM

## 2017-03-24 LAB — UA/M W/RFLX CULTURE, ROUTINE
BILIRUBIN UA: NEGATIVE
GLUCOSE, UA: NEGATIVE
KETONES UA: NEGATIVE
Leukocytes, UA: NEGATIVE
Nitrite, UA: NEGATIVE
Protein, UA: NEGATIVE
SPEC GRAV UA: 1.025 (ref 1.005–1.030)
Urobilinogen, Ur: 0.2 mg/dL (ref 0.2–1.0)
pH, UA: 5 (ref 5.0–7.5)

## 2017-03-24 LAB — MICROSCOPIC EXAMINATION

## 2017-03-24 MED ORDER — CEFDINIR 300 MG PO CAPS: 300.0000 mg | ORAL_CAPSULE | Freq: Two times a day (BID) | ORAL | 0 refills | Status: DC

## 2017-03-24 MED ORDER — BUPROPION HCL ER (XL) 150 MG PO TB24: 150.0000 mg | ORAL_TABLET | Freq: Every day | ORAL | 1 refills | Status: DC

## 2017-03-24 MED ORDER — ESCITALOPRAM OXALATE 20 MG PO TABS
10.0000 mg | ORAL_TABLET | Freq: Every day | ORAL | 1 refills | Status: DC
Start: 2017-03-24 — End: 2017-08-06

## 2017-03-24 NOTE — Assessment & Plan Note (Signed)
Rechecking levels today. Await results.  

## 2017-03-24 NOTE — Assessment & Plan Note (Signed)
Will increase lexapro to 20mg  and recheck 1 month. Start taking wellbutrin in the AM. Call with any concerns.

## 2017-03-24 NOTE — Assessment & Plan Note (Signed)
Stable. Continue current regimen. Continue to monitor. Call with any concerns.  

## 2017-03-24 NOTE — Assessment & Plan Note (Signed)
Stable. Continue to follow with pulmonology. Call with any concerns.  

## 2017-03-24 NOTE — Assessment & Plan Note (Signed)
Checking urine today. Await results.  

## 2017-03-24 NOTE — Progress Notes (Signed)
BP 115/75 (BP Location: Left Arm, Patient Position: Sitting, Cuff Size: Normal)   Pulse 71   Temp 99.6 F (37.6 C) (Oral)   Wt 184 lb 1 oz (83.5 kg)   SpO2 99%   BMI 27.99 kg/m    Subjective:    Patient ID: [name], female    DOB: 1970-02-29, 47 y.o.   MRN: [id number]  HPI: [name] is a 47 y.o. female presenting on 03/24/2017 for comprehensive medical examination. Current medical complaints include:  Has been having issues with her sinuses for a few months. Has taken antibiotics, but still not feeling 100%  DEPRESSION- has been sleeping a lot. Her best friends grandson passed away, and she has been under a lot of extra stress. Has been sleeping 10-12 hours at night and napping for 2 hours during the day, has no energy. Not feeling refreshed when she wakes up, some snoring Mood status: exacerbated Satisfied with current treatment?: no Symptom severity: moderate  Duration of current treatment : chronic Side effects: no Medication compliance: excellent compliance Psychotherapy/counseling: no  Depressed mood: yes Anxious mood: yes Anhedonia: no Significant weight loss or gain: no Insomnia: yes hard to stay asleep Fatigue: yes Feelings of worthlessness or guilt: yes Impaired concentration/indecisiveness: yes Suicidal ideations: no Hopelessness: no Crying spells: no Depression screen [facility] 2/9 03/24/2017 02/29/2016 05/18/2015 11/16/2014  Decreased Interest 1 0 0 0  Down, Depressed, Hopeless 1 0 0 0  PHQ - 2 Score 2 0 0 0  Altered sleeping 3 - - -  Tired, decreased energy 3 - - -  Change in appetite 0 - - -  Feeling bad or failure about yourself  0 - - -  Trouble concentrating 0 - - -  Moving slowly or fidgety/restless 0 - - -  Suicidal thoughts 0 - - -  PHQ-9 Score 8 - - -   She currently lives with: husband Menopausal Symptoms: no  Depression Screen done today and results listed below:  Depression screen [facility] 2/9 03/24/2017 02/29/2016 05/18/2015 11/16/2014    Decreased Interest 1 0 0 0  Down, Depressed, Hopeless 1 0 0 0  PHQ - 2 Score 2 0 0 0  Altered sleeping 3 - - -  Tired, decreased energy 3 - - -  Change in appetite 0 - - -  Feeling bad or failure about yourself  0 - - -  Trouble concentrating 0 - - -  Moving slowly or fidgety/restless 0 - - -  Suicidal thoughts 0 - - -  PHQ-9 Score 8 - - -    Past Medical History:  Past Medical History:  Diagnosis Date  . Allergic rhinitis   . Anxiety   . Asthma   . Constipation   . Depression   . Hematuria   . Insomnia   . Reflux     Surgical History:  Past Surgical History:  Procedure Laterality Date  . ABDOMINAL HYSTERECTOMY    . APPENDECTOMY    . OVARIAN CYST DRAINAGE Right Nov. 2003  . TONSILLECTOMY    . TUBAL LIGATION      Medications:  Current Outpatient Medications on File Prior to Visit  Medication Sig  . B-D INS SYR ULTRAFINE 1CC/31G 31G X 5/16" 1 ML MISC USE FOR ALLERGY INJECTION TWICE A WEEK  . BLISOVI 24 FE 1-20 MG-MCG(24) tablet Take 1 tablet by mouth daily.  . cetirizine (ZYRTEC) 10 MG tablet Take 10 mg by mouth daily.  . cyanocobalamin 2000 MCG tablet Take  2,000 mcg by mouth daily.  Marland Kitchen DALIRESP 500 MCG TABS tablet Take 500 mg by mouth daily.  . fluticasone (FLONASE) 50 MCG/ACT nasal spray instill 2 sprays into each nostril once daily  . montelukast (SINGULAIR) 10 MG tablet Take 1 tablet (10 mg total) by mouth daily.  . VENTOLIN HFA 108 (90 Base) MCG/ACT inhaler inhale 2 puffs by mouth every 4 to 6 hours  . Vitamin D, Ergocalciferol, (DRISDOL) 50000 units CAPS capsule Take 50,000 Units by mouth once a week.  . zolpidem (AMBIEN) 10 MG tablet Take 1 tablet (10 mg total) by mouth at bedtime as needed.   No current facility-administered medications on file prior to visit.     Allergies:  Allergies  Allergen Reactions  . Penicillins Anaphylaxis  . Sulfa Antibiotics Other (See Comments)    Bad taste in mouth    Social History:  Social History   Socioeconomic  History  . Marital status: Divorced    Spouse name: Not on file  . Number of children: Not on file  . Years of education: Not on file  . Highest education level: Not on file  Social Needs  . Financial resource strain: Not on file  . Food insecurity - worry: Not on file  . Food insecurity - inability: Not on file  . Transportation needs - medical: Not on file  . Transportation needs - non-medical: Not on file  Occupational History  . Not on file  Tobacco Use  . Smoking status: Never Smoker  . Smokeless tobacco: Never Used  Substance and Sexual Activity  . Alcohol use: Yes    Comment: occasional  . Drug use: No  . Sexual activity: Not on file  Other Topics Concern  . Not on file  Social History Narrative  . Not on file   Social History   Tobacco Use  Smoking Status Never Smoker  Smokeless Tobacco Never Used   Social History   Substance and Sexual Activity  Alcohol Use Yes   Comment: occasional    Family History:  Family History  Problem Relation Age of Onset  . Cancer Mother        ovarian  . Stroke Father   . Cancer Maternal Uncle        lung  . Diabetes Maternal Grandmother   . Emphysema Maternal Grandfather   . Diabetes Paternal Grandmother   . Stroke Paternal Grandfather   . Breast cancer Neg Hx     Past medical history, surgical history, medications, allergies, family history and social history reviewed with patient today and changes made to appropriate areas of the chart.   Review of Systems  Constitutional: Negative.   HENT: Positive for congestion and sinus pain. Negative for ear discharge, ear pain, hearing loss, nosebleeds, sore throat and tinnitus.   Eyes: Negative.   Respiratory: Negative.  Negative for stridor.   Cardiovascular: Negative.   Gastrointestinal: Negative.   Genitourinary: Positive for urgency. Negative for dysuria, flank pain, frequency and hematuria.  Musculoskeletal: Negative.   Skin: Negative.   Neurological: Positive for  headaches. Negative for dizziness, tingling, tremors, sensory change, speech change, focal weakness, seizures and loss of consciousness.  Endo/Heme/Allergies: Positive for environmental allergies. Negative for polydipsia. Does not bruise/bleed easily.  Psychiatric/Behavioral: Positive for depression. Negative for hallucinations, memory loss, substance abuse and suicidal ideas. The patient is nervous/anxious and has insomnia.      All other ROS negative except what is listed above and in the HPI.  Objective:    BP 115/75 (BP Location: Left Arm, Patient Position: Sitting, Cuff Size: Normal)   Pulse 71   Temp 99.6 F (37.6 C) (Oral)   Wt 184 lb 1 oz (83.5 kg)   SpO2 99%   BMI 27.99 kg/m   Wt Readings from Last 3 Encounters:  03/24/17 184 lb 1 oz (83.5 kg)  02/26/17 181 lb (82.1 kg)  02/04/17 188 lb (85.3 kg)    Physical Exam  Constitutional: She is oriented to person, place, and time. She appears well-developed and well-nourished. No distress.  HENT:  Head: Normocephalic and atraumatic.  Right Ear: Hearing, tympanic membrane, external ear and ear canal normal.  Left Ear: Hearing, tympanic membrane, external ear and ear canal normal.  Nose: Mucosal edema and rhinorrhea present. Right sinus exhibits no maxillary sinus tenderness and no frontal sinus tenderness. Left sinus exhibits maxillary sinus tenderness. Left sinus exhibits no frontal sinus tenderness.  Mouth/Throat: Uvula is midline, oropharynx is clear and moist and mucous membranes are normal. No oropharyngeal exudate.  Eyes: Conjunctivae, EOM and lids are normal. Pupils are equal, round, and reactive to light. Right eye exhibits no discharge. Left eye exhibits no discharge. No scleral icterus.  Neck: Normal range of motion. Neck supple. No JVD present. No tracheal deviation present. No thyromegaly present.  Cardiovascular: Normal rate, regular rhythm, normal heart sounds and intact distal pulses. Exam reveals no gallop and no  friction rub.  No murmur heard. Pulmonary/Chest: Effort normal and breath sounds normal. No stridor. No respiratory distress. She has no wheezes. She has no rales. She exhibits no tenderness.  Abdominal: Soft. Bowel sounds are normal. She exhibits no distension and no mass. There is no tenderness. There is no rebound and no guarding.  Genitourinary:  Genitourinary Comments: Breast and pelvic exams deferred- done at GYN  Musculoskeletal: Normal range of motion. She exhibits no edema, tenderness or deformity.  Lymphadenopathy:    She has no cervical adenopathy.  Neurological: She is alert and oriented to person, place, and time. She has normal reflexes. She displays normal reflexes. No cranial nerve deficit. She exhibits normal muscle tone. Coordination normal.  Skin: Skin is warm, dry and intact. No rash noted. She is not diaphoretic. No erythema. No pallor.  Psychiatric: She has a normal mood and affect. Her speech is normal and behavior is normal. Judgment and thought content normal. Cognition and memory are normal.    Results for orders placed or performed in visit on 03/24/17  HM PAP SMEAR  Result Value Ref Range   HM Pap smear Negative, negative HPV       Assessment & Plan:   Problem List Items Addressed This Visit      Respiratory   Asthma    Stable. Continue to follow with pulmonology. Call with any concerns.         Digestive   GERD without esophagitis    Stable. Continue current regimen. Continue to monitor. Call with any concerns.         Other   Depression    Will increase lexapro to 20mg  and recheck 1 month. Start taking wellbutrin in the AM. Call with any concerns.       Relevant Medications   escitalopram (LEXAPRO) 20 MG tablet   buPROPion (WELLBUTRIN XL) 150 MG 24 hr tablet   Other Relevant Orders   Comprehensive metabolic panel   Thyroid Panel With TSH   Anxiety    Will increase lexapro to 20mg  and recheck 1 month. Start  taking wellbutrin in the AM. Call  with any concerns.       Relevant Medications   escitalopram (LEXAPRO) 20 MG tablet   buPROPion (WELLBUTRIN XL) 150 MG 24 hr tablet   Hematuria    Checking urine today. Await results.       Relevant Orders   Comprehensive metabolic panel   UA/M w/rflx Culture, Routine   Vitamin B12 deficiency (non anemic)    Rechecking levels today. Await results.      Relevant Orders   CBC with Differential/Platelet   Comprehensive metabolic panel   C78 and Folate Panel    Other Visit Diagnoses    Routine general medical examination at a health care facility    -  Primary   Vaccines up to date. Screening labs checked today. Pap and mammogram up to date. Continue diet and exercise. Call with any concerns.    Relevant Orders   CBC with Differential/Platelet   Comprehensive metabolic panel   Lipid Panel w/o Chol/HDL Ratio   UA/M w/rflx Culture, Routine   B12 and Folate Panel   Vitamin D deficiency       Rechecking levels today. Await results.   Relevant Orders   VITAMIN D 25 Hydroxy (Vit-D Deficiency, Fractures)   Other fatigue       Concern about depression. Will check labs and increase lexapro. Recheck 1 month. Call with any concerns.    Relevant Orders   Thyroid Panel With TSH   Acute recurrent maxillary sinusitis       Will treat with omnicef. Call with any concerns.    Relevant Medications   cefdinir (OMNICEF) 300 MG capsule       Follow up plan: Return in about 4 weeks (around 04/21/2017) for Follow up mood and fatigue.   LABORATORY TESTING:  - Pap smear: done elsewhere, up to date  IMMUNIZATIONS:   - Tdap: Tetanus vaccination status reviewed: last tetanus booster within 10 years. - Influenza: Refused - Pneumovax: Up to date  SCREENING: -Mammogram: Up to date  - Colonoscopy: Not applicable   PATIENT COUNSELING:   Advised to take 1 mg of folate supplement per day if capable of pregnancy.   Sexuality: Discussed sexually transmitted diseases, partner selection, use of  condoms, avoidance of unintended pregnancy  and contraceptive alternatives.   Advised to avoid cigarette smoking.  I discussed with the patient that most people either abstain from alcohol or drink within safe limits (<=14/week and <=4 drinks/occasion for males, <=7/weeks and <= 3 drinks/occasion for females) and that the risk for alcohol disorders and other health effects rises proportionally with the number of drinks per week and how often a drinker exceeds daily limits.  Discussed cessation/primary prevention of drug use and availability of treatment for abuse.   Diet: Encouraged to adjust caloric intake to maintain  or achieve ideal body weight, to reduce intake of dietary saturated fat and total fat, to limit sodium intake by avoiding high sodium foods and not adding table salt, and to maintain adequate dietary potassium and calcium preferably from fresh fruits, vegetables, and low-fat dairy products.    stressed the importance of regular exercise  Injury prevention: Discussed safety belts, safety helmets, smoke detector, smoking near bedding or upholstery.   Dental health: Discussed importance of regular tooth brushing, flossing, and dental visits.    NEXT PREVENTATIVE PHYSICAL DUE IN 1 YEAR. Return in about 4 weeks (around 04/21/2017) for Follow up mood and fatigue.

## 2017-03-24 NOTE — Patient Instructions (Addendum)

## 2017-03-25 ENCOUNTER — Encounter: Payer: Self-pay | Admitting: Family Medicine

## 2017-03-25 LAB — COMPREHENSIVE METABOLIC PANEL
A/G RATIO: 2 (ref 1.2–2.2)
ALK PHOS: 51 IU/L (ref 39–117)
ALT: 7 IU/L (ref 0–32)
AST: 10 IU/L (ref 0–40)
Albumin: 4.7 g/dL (ref 3.5–5.5)
BUN / CREAT RATIO: 18 (ref 9–23)
BUN: 12 mg/dL (ref 6–24)
CO2: 22 mmol/L (ref 20–29)
CREATININE: 0.66 mg/dL (ref 0.57–1.00)
Calcium: 10.1 mg/dL (ref 8.7–10.2)
Chloride: 103 mmol/L (ref 96–106)
GFR calc Af Amer: 121 mL/min/{1.73_m2} (ref >59)
GFR calc non Af Amer: 105 mL/min/{1.73_m2} (ref >59)
GLOBULIN, TOTAL: 2.4 g/dL (ref 1.5–4.5)
Glucose: 92 mg/dL (ref 65–99)
Potassium: 4.3 mmol/L (ref 3.5–5.2)
SODIUM: 141 mmol/L (ref 134–144)
Total Protein: 7.1 g/dL (ref 6.0–8.5)

## 2017-03-25 LAB — THYROID PANEL WITH TSH
FREE THYROXINE INDEX: 1.2 (ref 1.2–4.9)
T3 Uptake Ratio: 20 % — ABNORMAL LOW (ref 24–39)
T4, Total: 6.2 ug/dL (ref 4.5–12.0)
TSH: 0.976 u[IU]/mL (ref 0.450–4.500)

## 2017-03-25 LAB — LIPID PANEL W/O CHOL/HDL RATIO
CHOLESTEROL TOTAL: 220 mg/dL — AB (ref 100–199)
HDL: 62 mg/dL (ref >39)
LDL CALC: 100 mg/dL — AB (ref 0–99)
Triglycerides: 292 mg/dL — ABNORMAL HIGH (ref 0–149)
VLDL Cholesterol Cal: 58 mg/dL — ABNORMAL HIGH (ref 5–40)

## 2017-03-25 LAB — CBC WITH DIFFERENTIAL/PLATELET
Basophils Absolute: 0.1 10*3/uL (ref 0.0–0.2)
Basos: 1 %
EOS (ABSOLUTE): 0.4 10*3/uL (ref 0.0–0.4)
EOS: 6 %
HEMATOCRIT: 35.2 % (ref 34.0–46.6)
Hemoglobin: 12.1 g/dL (ref 11.1–15.9)
Immature Grans (Abs): 0 10*3/uL (ref 0.0–0.1)
Immature Granulocytes: 0 %
LYMPHS ABS: 2.6 10*3/uL (ref 0.7–3.1)
Lymphs: 39 %
MCH: 31.4 pg (ref 26.6–33.0)
MCHC: 34.4 g/dL (ref 31.5–35.7)
MCV: 91 fL (ref 79–97)
MONOCYTES: 5 %
Monocytes Absolute: 0.3 10*3/uL (ref 0.1–0.9)
NEUTROS ABS: 3.3 10*3/uL (ref 1.4–7.0)
Neutrophils: 49 %
Platelets: 277 10*3/uL (ref 150–379)
RBC: 3.85 x10E6/uL (ref 3.77–5.28)
RDW: 12.9 % (ref 12.3–15.4)
WBC: 6.7 10*3/uL (ref 3.4–10.8)

## 2017-03-25 LAB — VITAMIN D 25 HYDROXY (VIT D DEFICIENCY, FRACTURES): VIT D 25 HYDROXY: 35.4 ng/mL (ref 30.0–100.0)

## 2017-03-25 LAB — B12 AND FOLATE PANEL
Folate: 11 ng/mL (ref >3.0)
Vitamin B-12: 1204 pg/mL (ref 232–1245)

## 2017-06-16 ENCOUNTER — Other Ambulatory Visit: Payer: Self-pay | Admitting: Internal Medicine

## 2017-06-22 ENCOUNTER — Other Ambulatory Visit: Payer: Self-pay | Admitting: Obstetrics and Gynecology

## 2017-06-22 DIAGNOSIS — N6310 Unspecified lump in the right breast, unspecified quadrant: Secondary | ICD-10-CM

## 2017-06-25 ENCOUNTER — Encounter: Payer: Self-pay | Admitting: Family Medicine

## 2017-06-25 ENCOUNTER — Ambulatory Visit (INDEPENDENT_AMBULATORY_CARE_PROVIDER_SITE_OTHER): Payer: BLUE CROSS/BLUE SHIELD | Admitting: Family Medicine

## 2017-06-25 VITALS — BP 134/80 | HR 70 | Temp 98.7°F | Ht 68.0 in | Wt 183.5 lb

## 2017-06-25 DIAGNOSIS — J309 Allergic rhinitis, unspecified: Secondary | ICD-10-CM

## 2017-06-25 DIAGNOSIS — H9202 Otalgia, left ear: Secondary | ICD-10-CM

## 2017-06-25 MED ORDER — PREDNISONE 20 MG PO TABS: 40.0000 mg | ORAL_TABLET | Freq: Every day | ORAL | 0 refills | Status: DC

## 2017-06-25 MED ORDER — CLINDAMYCIN HCL 150 MG PO CAPS: 150.0000 mg | ORAL_CAPSULE | Freq: Two times a day (BID) | ORAL | 0 refills | Status: DC

## 2017-06-25 NOTE — Progress Notes (Signed)
BP 134/80   Pulse 70   Temp 98.7 F (37.1 C) (Oral)   Ht 5\' 8"  (1.727 m)   Wt 183 lb 8 oz (83.2 kg)   SpO2 99%   BMI 27.90 kg/m    Subjective:    Patient ID: [name], female    DOB: 07-29-1970, 47 y.o.   MRN: [id number]  HPI: [name] is a 47 y.o. female  Chief Complaint  Patient presents with  . Ear Pain    left ear, some swelling into the jaw as well. States this has been going on for about 3 days.    Left ear pain and muffled hearing, swelling and pain going down into left jaw, headache, malaise worsening x 3 days, Taking sudafed and ibuprofen with very mild relief. Hx of severe allergies, on zyrtec, flonase, and singulair daily and feels her allergy sxs have been under good control. Denies known fevers, CP, SOB, N/V, sore throat.   Relevant past medical, surgical, family and social history reviewed and updated as indicated. Interim medical history since our last visit reviewed. Allergies and medications reviewed and updated.  Review of Systems  Per HPI unless specifically indicated above     Objective:    BP 134/80   Pulse 70   Temp 98.7 F (37.1 C) (Oral)   Ht 5\' 8"  (1.727 m)   Wt 183 lb 8 oz (83.2 kg)   SpO2 99%   BMI 27.90 kg/m   Wt Readings from Last 3 Encounters:  06/25/17 183 lb 8 oz (83.2 kg)  03/24/17 184 lb 1 oz (83.5 kg)  02/26/17 181 lb (82.1 kg)    Physical Exam  Constitutional: She is oriented to person, place, and time. She appears well-developed and well-nourished. No distress.  HENT:  Head: Atraumatic.  Right Ear: External ear normal.  Left Ear: External ear normal.  Nose: Nose normal.  Oropharynx mildly erythematous No evidence of dental infection or inflammation  Eyes: Pupils are equal, round, and reactive to light. Conjunctivae and EOM are normal.  Neck: Normal range of motion. Neck supple.  Cardiovascular: Normal rate and regular rhythm.  Pulmonary/Chest: Effort normal and breath sounds normal.  Musculoskeletal:  Normal range of motion.  Lymphadenopathy:    She has cervical adenopathy.  Neurological: She is alert and oriented to person, place, and time.  Skin: Skin is warm and dry.  Psychiatric: She has a normal mood and affect. Her behavior is normal.  Nursing note and vitals reviewed.   Results for orders placed or performed in visit on 03/24/17  Microscopic Examination  Result Value Ref Range   WBC, UA 0-5 0 - 5 /hpf   RBC, UA 0-2 0 - 2 /hpf   Epithelial Cells (non renal) 0-10 0 - 10 /hpf   Bacteria, UA Few (A) None seen/Few  CBC with Differential/Platelet  Result Value Ref Range   WBC 6.7 3.4 - 10.8 x10E3/uL   RBC 3.85 3.77 - 5.28 x10E6/uL   Hemoglobin 12.1 11.1 - 15.9 g/dL   Hematocrit 35.2 34.0 - 46.6 %   MCV 91 79 - 97 fL   MCH 31.4 26.6 - 33.0 pg   MCHC 34.4 31.5 - 35.7 g/dL   RDW 12.9 12.3 - 15.4 %   Platelets 277 150 - 379 x10E3/uL   Neutrophils 49 Not Estab. %   Lymphs 39 Not Estab. %   Monocytes 5 Not Estab. %   Eos 6 Not Estab. %   Basos 1 Not  Estab. %   Neutrophils Absolute 3.3 1.4 - 7.0 x10E3/uL   Lymphocytes Absolute 2.6 0.7 - 3.1 x10E3/uL   Monocytes Absolute 0.3 0.1 - 0.9 x10E3/uL   EOS (ABSOLUTE) 0.4 0.0 - 0.4 x10E3/uL   Basophils Absolute 0.1 0.0 - 0.2 x10E3/uL   Immature Granulocytes 0 Not Estab. %   Immature Grans (Abs) 0.0 0.0 - 0.1 x10E3/uL  Comprehensive metabolic panel  Result Value Ref Range   Glucose 92 65 - 99 mg/dL   BUN 12 6 - 24 mg/dL   Creatinine, Ser 0.66 0.57 - 1.00 mg/dL   GFR calc non Af Amer 105 >59 mL/min/1.73   GFR calc Af Amer 121 >59 mL/min/1.73   BUN/Creatinine Ratio 18 9 - 23   Sodium 141 134 - 144 mmol/L   Potassium 4.3 3.5 - 5.2 mmol/L   Chloride 103 96 - 106 mmol/L   CO2 22 20 - 29 mmol/L   Calcium 10.1 8.7 - 10.2 mg/dL   Total Protein 7.1 6.0 - 8.5 g/dL   Albumin 4.7 3.5 - 5.5 g/dL   Globulin, Total 2.4 1.5 - 4.5 g/dL   Albumin/Globulin Ratio 2.0 1.2 - 2.2   Bilirubin Total <0.2 0.0 - 1.2 mg/dL   Alkaline Phosphatase 51  39 - 117 IU/L   AST 10 0 - 40 IU/L   ALT 7 0 - 32 IU/L  Lipid Panel w/o Chol/HDL Ratio  Result Value Ref Range   Cholesterol, Total 220 (H) 100 - 199 mg/dL   Triglycerides 292 (H) 0 - 149 mg/dL   HDL 62 >39 mg/dL   VLDL Cholesterol Cal 58 (H) 5 - 40 mg/dL   LDL Calculated 100 (H) 0 - 99 mg/dL  UA/M w/rflx Culture, Routine  Result Value Ref Range   Specific Gravity, UA 1.025 1.005 - 1.030   pH, UA 5.0 5.0 - 7.5   Color, UA Yellow Yellow   Appearance Ur Clear Clear   Leukocytes, UA Negative Negative   Protein, UA Negative Negative/Trace   Glucose, UA Negative Negative   Ketones, UA Negative Negative   RBC, UA 1+ (A) Negative   Bilirubin, UA Negative Negative   Urobilinogen, Ur 0.2 0.2 - 1.0 mg/dL   Nitrite, UA Negative Negative   Microscopic Examination See below:   B12 and Folate Panel  Result Value Ref Range   Vitamin B-12 1,204 232 - 1,245 pg/mL   Folate 11.0 >3.0 ng/mL  HM PAP SMEAR  Result Value Ref Range   HM Pap smear Negative, negative HPV   VITAMIN D 25 Hydroxy (Vit-D Deficiency, Fractures)  Result Value Ref Range   Vit D, 25-Hydroxy 35.4 30.0 - 100.0 ng/mL  Thyroid Panel With TSH  Result Value Ref Range   TSH 0.976 0.450 - 4.500 uIU/mL   T4, Total 6.2 4.5 - 12.0 ug/dL   T3 Uptake Ratio 20 (L) 24 - 39 %   Free Thyroxine Index 1.2 1.2 - 4.9      Assessment & Plan:   Problem List Items Addressed This Visit      Respiratory   Allergic rhinitis    Continue current allergy regimen, monitor closely for worsening sxs       Other Visit Diagnoses    Left ear pain    -  Primary   No evidence of otitis, does have some adenopathy. Unclear source.WIll tx with prednisone and clinda, OTC pain relievers, allergy regimen. F/u if no better       Follow up plan: Return if symptoms  worsen or fail to improve.

## 2017-06-26 ENCOUNTER — Other Ambulatory Visit: Payer: BLUE CROSS/BLUE SHIELD

## 2017-06-28 NOTE — Assessment & Plan Note (Signed)
Continue current allergy regimen, monitor closely for worsening sxs

## 2017-06-28 NOTE — Patient Instructions (Signed)
Follow up as needed

## 2017-06-30 ENCOUNTER — Ambulatory Visit
Admission: RE | Admit: 2017-06-30 | Discharge: 2017-06-30 | Disposition: A | Discharge status: Home / Self Care | Payer: BLUE CROSS/BLUE SHIELD | Source: Ambulatory Visit | Attending: Obstetrics and Gynecology | Admitting: Obstetrics and Gynecology

## 2017-06-30 ENCOUNTER — Telehealth: Payer: Self-pay

## 2017-06-30 DIAGNOSIS — N6310 Unspecified lump in the right breast, unspecified quadrant: Secondary | ICD-10-CM

## 2017-06-30 DIAGNOSIS — N644 Mastodynia: Secondary | ICD-10-CM | POA: Insufficient documentation

## 2017-06-30 DIAGNOSIS — R922 Inconclusive mammogram: Secondary | ICD-10-CM | POA: Diagnosis not present

## 2017-07-02 ENCOUNTER — Telehealth: Payer: Self-pay | Admitting: Family Medicine

## 2017-07-02 NOTE — Telephone Encounter (Signed)
Copied from [place] [id number]. Topic: Quick Communication - See Telephone Encounter >> Jul 02, 2017  1:38 PM Hewitt Shorts wrote: Pt is wanting to let the office know that she is still not any better and would like to know If something stronger can be called in   Best number [phone]  [facility]

## 2017-07-03 DIAGNOSIS — J301 Allergic rhinitis due to pollen: Secondary | ICD-10-CM | POA: Diagnosis not present

## 2017-07-03 MED ORDER — PREDNISONE 20 MG PO TABS: 40.0000 mg | ORAL_TABLET | Freq: Every day | ORAL | 0 refills | Status: DC

## 2017-07-03 MED ORDER — PREDNISONE 50 MG PO TABS: 50.0000 mg | ORAL_TABLET | Freq: Every day | ORAL | 0 refills | Status: DC

## 2017-07-03 NOTE — Telephone Encounter (Signed)
Patient was seen by R. Lane on 06/25/17 with Left ear pain. Dx read No evidence of otitis, does have some adenopathy. Unclear source. She was prescribed Clindamycin and prednisone-has completed both. She called back c/o left ear pain only about 50% improved. Continues to have inner ear pain, feels like the ear is stopped up and is popping. She is asking if she might need another abx or some ear drops. Denies fever. Pharmacy on file. PCP Dr. Wynetta Emery.  Please advise.

## 2017-07-03 NOTE — Telephone Encounter (Signed)
Prednisone sent to her pharmacy. If not better by Monday- should be seen.

## 2017-07-03 NOTE — Telephone Encounter (Signed)
Patient notified and agreed to plan

## 2017-07-06 ENCOUNTER — Ambulatory Visit: Payer: Self-pay

## 2017-07-07 ENCOUNTER — Ambulatory Visit (INDEPENDENT_AMBULATORY_CARE_PROVIDER_SITE_OTHER): Payer: BLUE CROSS/BLUE SHIELD

## 2017-07-07 DIAGNOSIS — J301 Allergic rhinitis due to pollen: Secondary | ICD-10-CM | POA: Diagnosis not present

## 2017-07-07 NOTE — Telephone Encounter (Signed)
Serum is mixed.

## 2017-07-12 ENCOUNTER — Other Ambulatory Visit: Payer: Self-pay | Admitting: Internal Medicine

## 2017-07-16 ENCOUNTER — Other Ambulatory Visit: Payer: Self-pay

## 2017-07-16 DIAGNOSIS — F5101 Primary insomnia: Secondary | ICD-10-CM

## 2017-07-16 MED ORDER — ZOLPIDEM TARTRATE 10 MG PO TABS: 10.0000 mg | ORAL_TABLET | Freq: Every evening | ORAL | 1 refills | Status: DC | PRN

## 2017-08-03 ENCOUNTER — Encounter: Payer: Self-pay | Admitting: Internal Medicine

## 2017-08-03 ENCOUNTER — Ambulatory Visit (INDEPENDENT_AMBULATORY_CARE_PROVIDER_SITE_OTHER): Payer: BLUE CROSS/BLUE SHIELD | Admitting: Internal Medicine

## 2017-08-03 VITALS — BP 120/70 | HR 83 | Resp 16 | Ht 68.0 in | Wt 188.6 lb

## 2017-08-03 DIAGNOSIS — J301 Allergic rhinitis due to pollen: Secondary | ICD-10-CM

## 2017-08-03 DIAGNOSIS — R609 Edema, unspecified: Secondary | ICD-10-CM | POA: Diagnosis not present

## 2017-08-03 DIAGNOSIS — R0602 Shortness of breath: Secondary | ICD-10-CM | POA: Diagnosis not present

## 2017-08-03 DIAGNOSIS — J4521 Mild intermittent asthma with (acute) exacerbation: Secondary | ICD-10-CM | POA: Diagnosis not present

## 2017-08-03 NOTE — Progress Notes (Signed)
[facility] [place], [place] [address]  Pulmonary Sleep Medicine   Office Visit Note  Patient Name: [name] DOB: 1970/03/01 MRN [id number]  Date of Service: 08/03/2017  Complaints/HPI:  She states that she has had some swelling of her left parotid area she was seen by her primary care physician did not really find anything.  She went to see a dentist and they did not find anything.  Right now she says the swelling is getting better.  She has noted a little bit of dryness in her mouth.  She does not note any particular area of point tenderness.  She also noted that there was some swelling in her submandibular area.  She is doing well otherwise as far as the asthma is concerned.  She was given antibiotics which she completed at the end of May.  States she has not had any flare ups of her asthma.  She has been using daily rest as an adjunct therapy for her asthma and this has actually helped her quite a bit.  Now she states that she is requiring a prior authorization from her insurance company.  I think patient is to continue with daily rest since she has gained benefit and has a history very severe uncontrolled asthmatic symptoms which have been much better control since she has been on the appropriate therapy  ROS  General: (-) fever, (-) chills, (-) night sweats, (-) weakness Skin: (-) rashes, (-) itching,. Eyes: (-) visual changes, (-) redness, (-) itching. Nose and Sinuses: (-) nasal stuffiness or itchiness, (-) postnasal drip, (-) nosebleeds, (-) sinus trouble. Mouth and Throat: (-) sore throat, (-) hoarseness. Neck: (-) swollen glands, (-) enlarged thyroid, (-) neck pain. Respiratory: - cough, (-) bloody sputum, + shortness of breath, - wheezing. Cardiovascular: - ankle swelling, (-) chest pain. Lymphatic: (-) lymph node enlargement. Neurologic: (-) numbness, (-) tingling. Psychiatric: (-) anxiety, (-) depression   Current Medication: Outpatient  Encounter Medications as of 08/03/2017  Medication Sig Note  . BLISOVI 24 FE 1-20 MG-MCG(24) tablet Take 1 tablet by mouth daily.   Marland Kitchen buPROPion (WELLBUTRIN XL) 150 MG 24 hr tablet Take 1 tablet (150 mg total) by mouth daily.   . cetirizine (ZYRTEC) 10 MG tablet Take 10 mg by mouth daily.   . clindamycin (CLEOCIN) 150 MG capsule Take 1 capsule (150 mg total) by mouth 2 (two) times daily.   . cyanocobalamin 2000 MCG tablet Take 2,000 mcg by mouth daily.   Marland Kitchen DALIRESP 500 MCG TABS tablet TAKE 1 TABLET BY MOUTH DAILY   . escitalopram (LEXAPRO) 20 MG tablet Take 0.5 tablets (10 mg total) by mouth daily.   . fluticasone (FLONASE) 50 MCG/ACT nasal spray instill 2 sprays into each nostril once daily 11/28/2015: Received from: External Pharmacy  . Insulin Syringe-Needle U-100 (INSULIN SYRINGE 1CC/31GX5/16") 31G X 5/16" 1 ML MISC USE FOR ALLERGIES TWO TIMES A WEEK   . montelukast (SINGULAIR) 10 MG tablet Take 1 tablet (10 mg total) by mouth daily.   . predniSONE (DELTASONE) 20 MG tablet Take 2 tablets (40 mg total) by mouth daily with breakfast.   . predniSONE (DELTASONE) 50 MG tablet Take 1 tablet (50 mg total) by mouth daily with breakfast.   . VENTOLIN HFA 108 (90 Base) MCG/ACT inhaler inhale 2 puffs by mouth every 4 to 6 hours (Patient not taking: Reported on 08/03/2017)   . Vitamin D, Ergocalciferol, (DRISDOL) 50000 units CAPS capsule Take 50,000 Units by mouth once a week.  11/28/2015: Received from: External Pharmacy  . zolpidem (AMBIEN) 10 MG tablet Take 1 tablet (10 mg total) by mouth at bedtime as needed.    No facility-administered encounter medications on file as of 08/03/2017.     Surgical History: Past Surgical History:  Procedure Laterality Date  . ABDOMINAL HYSTERECTOMY    . APPENDECTOMY    . OVARIAN CYST DRAINAGE Right Nov. 2003  . TONSILLECTOMY    . TUBAL LIGATION      Medical History: Past Medical History:  Diagnosis Date  . Allergic rhinitis   . Anxiety   . Asthma   .  Constipation   . Depression   . Hematuria   . Insomnia   . Reflux     Family History: Family History  Problem Relation Age of Onset  . Cancer Mother        ovarian  . Stroke Father   . Cancer Maternal Uncle        lung  . Diabetes Maternal Grandmother   . Emphysema Maternal Grandfather   . Diabetes Paternal Grandmother   . Stroke Paternal Grandfather   . Breast cancer Neg Hx     Social History: Social History   Socioeconomic History  . Marital status: Divorced    Spouse name: Not on file  . Number of children: Not on file  . Years of education: Not on file  . Highest education level: Not on file  Occupational History  . Not on file  Social Needs  . Financial resource strain: Not on file  . Food insecurity:    Worry: Not on file    Inability: Not on file  . Transportation needs:    Medical: Not on file    Non-medical: Not on file  Tobacco Use  . Smoking status: Never Smoker  . Smokeless tobacco: Never Used  Substance and Sexual Activity  . Alcohol use: Yes    Comment: occasional  . Drug use: No  . Sexual activity: Not on file  Lifestyle  . Physical activity:    Days per week: Not on file    Minutes per session: Not on file  . Stress: Not on file  Relationships  . Social connections:    Talks on phone: Not on file    Gets together: Not on file    Attends religious service: Not on file    Active member of club or organization: Not on file    Attends meetings of clubs or organizations: Not on file    Relationship status: Not on file  . Intimate partner violence:    Fear of current or ex partner: Not on file    Emotionally abused: Not on file    Physically abused: Not on file    Forced sexual activity: Not on file  Other Topics Concern  . Not on file  Social History Narrative  . Not on file    Vital Signs: Blood pressure 120/70, pulse 83, resp. rate 16, height 5\' 8"  (1.727 m), weight 188 lb 9.6 oz (85.5 kg), SpO2 98 %.  Examination: General  Appearance: The patient is well-developed, well-nourished, and in no distress. Skin: Gross inspection of skin unremarkable. Head: normocephalic, no gross deformities. Eyes: no gross deformities noted. ENT: ears appear grossly normal no exudates. Neck: Supple. No thyromegaly. No LAD. Respiratory: no rhonchi noted. Cardiovascular: Normal S1 and S2 without murmur or rub. Extremities: No cyanosis. pulses are equal. Neurologic: Alert and oriented. No involuntary movements.  LABS: No results found for  this or any previous visit (from the past 2160 hour(s)).  Radiology: US Breast Ltd Uni Right Inc Axilla  Result Date: 06/30/2017 CLINICAL DATA:  46 year old with focal pain and tenderness in the UPPER OUTER QUADRANT of the RIGHT breast with associated lumpiness in this location and a possible palpable lump on recent clinical examination. EXAM: DIGITAL DIAGNOSTIC RIGHT MAMMOGRAM WITH CAD AND TOMO ULTRASOUND RIGHT BREAST COMPARISON:  Mammography 03/18/2017, 03/19/2016 and earlier. RIGHT breast ultrasound 01/31/2011. ACR Breast Density Category c: The breast tissue is heterogeneously dense, which may obscure small masses. FINDINGS: Tomosynthesis and synthesized full field CC and MLO views of the RIGHT breast were obtained. Tomosynthesis and synthesized spot compression tangential view of the area of concern in the RIGHT breast was also obtained. Spot tangential images demonstrate normal fibroglandular tissue in the UPPER OUTER QUADRANT at POSTERIOR depth in the area of focal pain and palpable concern. There is no underlying mass or architectural distortion. No findings suspicious for malignancy in the RIGHT breast. Mammographic images were processed with CAD. On physical exam, there is a palpable ridge of tissue in the UPPER OUTER QUADRANT in the area of focal pain and palpable lumpiness, though I do not palpate a discrete mass. The patient describes tenderness to palpation in this location. Targeted RIGHT  breast ultrasound is performed, showing normal dense fibroglandular tissue in the UPPER OUTER QUADRANT at the 11 o'clock position approximately 8 cm from the nipple in the area of clinical concern. No cyst, solid mass or abnormal acoustic shadowing is identified. IMPRESSION: 1. No mammographic or sonographic evidence of malignancy involving the RIGHT breast. 2. Normal dense fibroglandular tissue is present in the UPPER OUTER QUADRANT of the RIGHT breast in the area of focal pain and palpable concern. RECOMMENDATION: Annual BILATERAL screening mammography which is due in late January, 2020. Strategies for alleviating breast pain including decreasing caffeine intake, vitamin-E supplementation, and avoidance of tight compression brassieres, including underwire brassieres, were discussed with the patient. I have discussed the findings and recommendations with the patient. Results were also provided in writing at the conclusion of the visit. If applicable, a reminder letter will be sent to the patient regarding the next appointment. BI-RADS CATEGORY  1: Negative. Electronically Signed   By: Evangeline Dakin M.D.   On: 06/30/2017 11:29   Mm Diag Breast Tomo Uni Right  Result Date: 06/30/2017 CLINICAL DATA:  47 year old with focal pain and tenderness in the UPPER OUTER QUADRANT of the RIGHT breast with associated lumpiness in this location and a possible palpable lump on recent clinical examination. EXAM: DIGITAL DIAGNOSTIC RIGHT MAMMOGRAM WITH CAD AND TOMO ULTRASOUND RIGHT BREAST COMPARISON:  Mammography 03/18/2017, 03/19/2016 and earlier. RIGHT breast ultrasound 01/31/2011. ACR Breast Density Category c: The breast tissue is heterogeneously dense, which may obscure small masses. FINDINGS: Tomosynthesis and synthesized full field CC and MLO views of the RIGHT breast were obtained. Tomosynthesis and synthesized spot compression tangential view of the area of concern in the RIGHT breast was also obtained. Spot tangential  images demonstrate normal fibroglandular tissue in the UPPER OUTER QUADRANT at POSTERIOR depth in the area of focal pain and palpable concern. There is no underlying mass or architectural distortion. No findings suspicious for malignancy in the RIGHT breast. Mammographic images were processed with CAD. On physical exam, there is a palpable ridge of tissue in the UPPER OUTER QUADRANT in the area of focal pain and palpable lumpiness, though I do not palpate a discrete mass. The patient describes tenderness to palpation in this  location. Targeted RIGHT breast ultrasound is performed, showing normal dense fibroglandular tissue in the UPPER OUTER QUADRANT at the 11 o'clock position approximately 8 cm from the nipple in the area of clinical concern. No cyst, solid mass or abnormal acoustic shadowing is identified. IMPRESSION: 1. No mammographic or sonographic evidence of malignancy involving the RIGHT breast. 2. Normal dense fibroglandular tissue is present in the UPPER OUTER QUADRANT of the RIGHT breast in the area of focal pain and palpable concern. RECOMMENDATION: Annual BILATERAL screening mammography which is due in late January, 2020. Strategies for alleviating breast pain including decreasing caffeine intake, vitamin-E supplementation, and avoidance of tight compression brassieres, including underwire brassieres, were discussed with the patient. I have discussed the findings and recommendations with the patient. Results were also provided in writing at the conclusion of the visit. If applicable, a reminder letter will be sent to the patient regarding the next appointment. BI-RADS CATEGORY  1: Negative. Electronically Signed   By: Evangeline Dakin M.D.   On: 06/30/2017 11:29    No results found.  No results found.    Assessment and Plan: Patient Active Problem List   Diagnosis Date Noted  . Family history of ovarian cancer 03/12/2016  . Vitamin B12 deficiency (non anemic) 11/18/2014  . Constipation    . Asthma   . Allergic rhinitis   . Depression   . Anxiety   . Hematuria   . Insomnia   . GERD without esophagitis     1. Asthma with broncitis getting better. She just came off abx.  She will continue with daily resp as she is gaining benefit from the medication when other medications were basically failed to keep her asthma under control.  She has a longstanding history of severe asthma / COPD which has been improved with daily resp control 2. Allergic rhinitis stable ahs been doing well with her shots 3. Parotid submanidibular  Swelling will refer for ENT evaluation  General Counseling: I have discussed the findings of the evaluation and examination with [name].  I have also discussed any further diagnostic evaluation thatmay be needed or ordered today. [name] verbalizes understanding of the findings of todays visit. We also reviewed her medications today and discussed drug interactions and side effects including but not limited excessive drowsiness and altered mental states. We also discussed that there is always a risk not just to her but also people around her. she has been encouraged to call the office with any questions or concerns that should arise related to todays visit.    Time spent: 52min  I have personally obtained a history, examined the patient, evaluated laboratory and imaging results, formulated the assessment and plan and placed orders.    Allyne Gee, MD [facility] Pulmonary and Critical Care Sleep medicine

## 2017-08-03 NOTE — Patient Instructions (Signed)

## 2017-08-05 ENCOUNTER — Encounter: Payer: Self-pay | Admitting: Family Medicine

## 2017-08-06 MED ORDER — ESCITALOPRAM OXALATE 20 MG PO TABS: 20.0000 mg | ORAL_TABLET | Freq: Every day | ORAL | 1 refills | Status: DC

## 2017-08-12 ENCOUNTER — Telehealth: Payer: Self-pay | Admitting: Internal Medicine

## 2017-08-12 NOTE — Progress Notes (Signed)
Spoke to Patient on 08/11/17 regarding prior auth for medication Daliresp 500 , prior Josem Kaufmann has been done but no response from [employer], refaxed the prior auth forms and patient state that she is going to call [employer] as well

## 2017-08-12 NOTE — Telephone Encounter (Signed)
Prior Auth for Daliresp 558mcg has been approved effective 08/04/17 to 08/12/2018

## 2017-08-13 ENCOUNTER — Ambulatory Visit: Payer: Self-pay | Admitting: Internal Medicine

## 2017-08-19 DIAGNOSIS — H6980 Other specified disorders of Eustachian tube, unspecified ear: Secondary | ICD-10-CM | POA: Diagnosis not present

## 2017-08-19 DIAGNOSIS — J301 Allergic rhinitis due to pollen: Secondary | ICD-10-CM | POA: Diagnosis not present

## 2017-09-14 ENCOUNTER — Other Ambulatory Visit: Payer: Self-pay | Admitting: Internal Medicine

## 2017-09-17 ENCOUNTER — Other Ambulatory Visit: Payer: Self-pay

## 2017-09-17 DIAGNOSIS — F5101 Primary insomnia: Secondary | ICD-10-CM

## 2017-09-17 MED ORDER — ZOLPIDEM TARTRATE 10 MG PO TABS: 10.0000 mg | ORAL_TABLET | Freq: Every evening | ORAL | 3 refills | Status: DC | PRN

## 2017-09-17 NOTE — Telephone Encounter (Signed)
Called phar for ambien 10mg  30 with 3 refills as per dr Clayborn Bigness is ok

## 2017-11-16 DIAGNOSIS — L821 Other seborrheic keratosis: Secondary | ICD-10-CM | POA: Diagnosis not present

## 2017-11-16 DIAGNOSIS — D229 Melanocytic nevi, unspecified: Secondary | ICD-10-CM | POA: Diagnosis not present

## 2017-11-16 DIAGNOSIS — L814 Other melanin hyperpigmentation: Secondary | ICD-10-CM | POA: Diagnosis not present

## 2017-11-16 DIAGNOSIS — L508 Other urticaria: Secondary | ICD-10-CM | POA: Diagnosis not present

## 2017-12-12 ENCOUNTER — Other Ambulatory Visit: Payer: Self-pay | Admitting: Internal Medicine

## 2017-12-15 ENCOUNTER — Telehealth: Payer: Self-pay

## 2017-12-15 NOTE — Telephone Encounter (Signed)
Pt called needing to order allergy serum.

## 2017-12-16 ENCOUNTER — Other Ambulatory Visit: Payer: Self-pay | Admitting: Internal Medicine

## 2017-12-24 DIAGNOSIS — J301 Allergic rhinitis due to pollen: Secondary | ICD-10-CM | POA: Diagnosis not present

## 2018-01-04 ENCOUNTER — Telehealth: Payer: Self-pay

## 2018-01-04 NOTE — Telephone Encounter (Signed)
Informed pt that her allergy serum was ready and she made an appointment for tomorrow to come get the first shot in the office.

## 2018-01-05 ENCOUNTER — Ambulatory Visit (INDEPENDENT_AMBULATORY_CARE_PROVIDER_SITE_OTHER): Payer: BLUE CROSS/BLUE SHIELD

## 2018-01-05 DIAGNOSIS — J301 Allergic rhinitis due to pollen: Secondary | ICD-10-CM | POA: Diagnosis not present

## 2018-01-05 NOTE — Progress Notes (Signed)
Given allergy injection in the office today pt waited  20 min

## 2018-01-18 ENCOUNTER — Other Ambulatory Visit: Payer: Self-pay | Admitting: Internal Medicine

## 2018-01-18 DIAGNOSIS — F5101 Primary insomnia: Secondary | ICD-10-CM

## 2018-01-19 ENCOUNTER — Other Ambulatory Visit: Payer: Self-pay

## 2018-01-19 DIAGNOSIS — F5101 Primary insomnia: Secondary | ICD-10-CM

## 2018-01-19 MED ORDER — ZOLPIDEM TARTRATE 10 MG PO TABS: 10.0000 mg | ORAL_TABLET | Freq: Every evening | ORAL | 0 refills | Status: DC | PRN

## 2018-01-19 NOTE — Telephone Encounter (Signed)
Pt advised called in zolpidem 10 mg #20 as per dr Clayborn Bigness

## 2018-02-04 ENCOUNTER — Other Ambulatory Visit: Payer: Self-pay

## 2018-02-04 ENCOUNTER — Ambulatory Visit: Payer: BLUE CROSS/BLUE SHIELD | Admitting: Internal Medicine

## 2018-02-04 ENCOUNTER — Encounter: Payer: Self-pay | Admitting: Internal Medicine

## 2018-02-04 VITALS — BP 122/72 | HR 75 | Resp 16 | Ht 68.0 in | Wt 193.0 lb

## 2018-02-04 DIAGNOSIS — F5101 Primary insomnia: Secondary | ICD-10-CM | POA: Diagnosis not present

## 2018-02-04 DIAGNOSIS — R0602 Shortness of breath: Secondary | ICD-10-CM

## 2018-02-04 DIAGNOSIS — J4521 Mild intermittent asthma with (acute) exacerbation: Secondary | ICD-10-CM | POA: Diagnosis not present

## 2018-02-04 DIAGNOSIS — K219 Gastro-esophageal reflux disease without esophagitis: Secondary | ICD-10-CM | POA: Diagnosis not present

## 2018-02-04 DIAGNOSIS — J301 Allergic rhinitis due to pollen: Secondary | ICD-10-CM | POA: Diagnosis not present

## 2018-02-04 MED ORDER — MONTELUKAST SODIUM 10 MG PO TABS: 10.0000 mg | ORAL_TABLET | Freq: Every day | ORAL | 3 refills | Status: DC

## 2018-02-04 MED ORDER — ZOLPIDEM TARTRATE 10 MG PO TABS: 10.0000 mg | ORAL_TABLET | Freq: Every evening | ORAL | 2 refills | Status: DC | PRN

## 2018-02-04 MED ORDER — CLINDAMYCIN HCL 300 MG PO CAPS: 300.0000 mg | ORAL_CAPSULE | Freq: Three times a day (TID) | ORAL | 0 refills | Status: AC

## 2018-02-04 MED ORDER — CLINDAMYCIN HCL 300 MG PO CAPS: 300.0000 mg | ORAL_CAPSULE | Freq: Three times a day (TID) | ORAL | 0 refills | Status: DC

## 2018-02-04 NOTE — Telephone Encounter (Signed)
Called in phar for singulair,ambien and clindamycin

## 2018-02-04 NOTE — Progress Notes (Signed)
[facility] [place], [place] [address]  Pulmonary Sleep Medicine   Office Visit Note  Patient Name: [name] DOB: Aug 01, 1970 MRN [id number]  Date of Service: 02/04/2018  Complaints/HPI: Pt is here for follow up on allergies and asthma.  She reports she is continuing to get allergy shots every two weeks.  She reports excellent results with her injections.  She denies any recent hospitalizations.  No chest pain, sob, hemoptysis, or headaches. She is using her inhalers, and nasal spray daily.  She reports some slight sinus congestion with cough that started yesterday.      ROS  General: (-) fever, (-) chills, (-) night sweats, (-) weakness Skin: (-) rashes, (-) itching,. Eyes: (-) visual changes, (-) redness, (-) itching. Nose and Sinuses: (-) nasal stuffiness or itchiness, (-) postnasal drip, (-) nosebleeds, (-) sinus trouble. Mouth and Throat: (-) sore throat, (-) hoarseness. Neck: (-) swollen glands, (-) enlarged thyroid, (-) neck pain. Respiratory: + cough, (-) bloody sputum, - shortness of breath, - wheezing. Cardiovascular: - ankle swelling, (-) chest pain. Lymphatic: (-) lymph node enlargement. Neurologic: (-) numbness, (-) tingling. Psychiatric: (-) anxiety, (-) depression   Current Medication: Outpatient Encounter Medications as of 02/04/2018  Medication Sig Note  . BLISOVI 24 FE 1-20 MG-MCG(24) tablet Take 1 tablet by mouth daily.   Marland Kitchen buPROPion (WELLBUTRIN XL) 150 MG 24 hr tablet Take 1 tablet (150 mg total) by mouth daily.   . cetirizine (ZYRTEC) 10 MG tablet Take 10 mg by mouth daily.   . cyanocobalamin 2000 MCG tablet Take 2,000 mcg by mouth daily.   Marland Kitchen DALIRESP 500 MCG TABS tablet TAKE ONE TABLET BY MOUTH DAILY   . escitalopram (LEXAPRO) 20 MG tablet Take 1 tablet (20 mg total) by mouth daily.   . fluticasone (FLONASE) 50 MCG/ACT nasal spray instill 2 sprays into each nostril once daily 11/28/2015: Received from: External Pharmacy  .  Insulin Syringe-Needle U-100 (INSULIN SYRINGE 1CC/31GX5/16") 31G X 5/16" 1 ML MISC USE FOR ALLERGIES TWO TIMES A WEEK   . montelukast (SINGULAIR) 10 MG tablet Take 1 tablet (10 mg total) by mouth daily.   . predniSONE (DELTASONE) 20 MG tablet Take 2 tablets (40 mg total) by mouth daily with breakfast.   . predniSONE (DELTASONE) 50 MG tablet Take 1 tablet (50 mg total) by mouth daily with breakfast.   . VENTOLIN HFA 108 (90 Base) MCG/ACT inhaler inhale 2 puffs by mouth every 4 to 6 hours   . Vitamin D, Ergocalciferol, (DRISDOL) 50000 units CAPS capsule Take 50,000 Units by mouth once a week. 11/28/2015: Received from: External Pharmacy  . zolpidem (AMBIEN) 10 MG tablet Take 1 tablet (10 mg total) by mouth at bedtime as needed.   . [DISCONTINUED] clindamycin (CLEOCIN) 150 MG capsule Take 1 capsule (150 mg total) by mouth 2 (two) times daily. (Patient not taking: Reported on 02/04/2018)    No facility-administered encounter medications on file as of 02/04/2018.     Surgical History: Past Surgical History:  Procedure Laterality Date  . ABDOMINAL HYSTERECTOMY    . APPENDECTOMY    . OVARIAN CYST DRAINAGE Right Nov. 2003  . TONSILLECTOMY    . TUBAL LIGATION      Medical History: Past Medical History:  Diagnosis Date  . Allergic rhinitis   . Anxiety   . Asthma   . Constipation   . Depression   . Hematuria   . Insomnia   . Reflux     Family History: Family  History  Problem Relation Age of Onset  . Cancer Mother        ovarian  . Stroke Father   . Cancer Maternal Uncle        lung  . Diabetes Maternal Grandmother   . Emphysema Maternal Grandfather   . Diabetes Paternal Grandmother   . Stroke Paternal Grandfather   . Breast cancer Neg Hx     Social History: Social History   Socioeconomic History  . Marital status: Divorced    Spouse name: Not on file  . Number of children: Not on file  . Years of education: Not on file  . Highest education level: Not on file   Occupational History  . Not on file  Social Needs  . Financial resource strain: Not on file  . Food insecurity:    Worry: Not on file    Inability: Not on file  . Transportation needs:    Medical: Not on file    Non-medical: Not on file  Tobacco Use  . Smoking status: Never Smoker  . Smokeless tobacco: Never Used  Substance and Sexual Activity  . Alcohol use: Yes    Comment: occasional  . Drug use: No  . Sexual activity: Not on file  Lifestyle  . Physical activity:    Days per week: Not on file    Minutes per session: Not on file  . Stress: Not on file  Relationships  . Social connections:    Talks on phone: Not on file    Gets together: Not on file    Attends religious service: Not on file    Active member of club or organization: Not on file    Attends meetings of clubs or organizations: Not on file    Relationship status: Not on file  . Intimate partner violence:    Fear of current or ex partner: Not on file    Emotionally abused: Not on file    Physically abused: Not on file    Forced sexual activity: Not on file  Other Topics Concern  . Not on file  Social History Narrative  . Not on file    Vital Signs: Blood pressure 122/72, pulse 75, resp. rate 16, height 5\' 8"  (1.727 m), weight 193 lb (87.5 kg), SpO2 99 %.  Examination: General Appearance: The patient is well-developed, well-nourished, and in no distress. Skin: Gross inspection of skin unremarkable. Head: normocephalic, no gross deformities. Eyes: no gross deformities noted. ENT: ears appear grossly normal no exudates. Neck: Supple. No thyromegaly. No LAD. Respiratory: Clear bilateraly. Cardiovascular: Normal S1 and S2 without murmur or rub. Extremities: No cyanosis. pulses are equal. Neurologic: Alert and oriented. No involuntary movements.  LABS: No results found for this or any previous visit (from the past 2160 hour(s)).  Radiology: US Breast Ltd Uni Right Inc Axilla  Result Date:  06/30/2017 CLINICAL DATA:  47 year old with focal pain and tenderness in the UPPER OUTER QUADRANT of the RIGHT breast with associated lumpiness in this location and a possible palpable lump on recent clinical examination. EXAM: DIGITAL DIAGNOSTIC RIGHT MAMMOGRAM WITH CAD AND TOMO ULTRASOUND RIGHT BREAST COMPARISON:  Mammography 03/18/2017, 03/19/2016 and earlier. RIGHT breast ultrasound 01/31/2011. ACR Breast Density Category c: The breast tissue is heterogeneously dense, which may obscure small masses. FINDINGS: Tomosynthesis and synthesized full field CC and MLO views of the RIGHT breast were obtained. Tomosynthesis and synthesized spot compression tangential view of the area of concern in the RIGHT breast was also obtained. Spot tangential  images demonstrate normal fibroglandular tissue in the UPPER OUTER QUADRANT at POSTERIOR depth in the area of focal pain and palpable concern. There is no underlying mass or architectural distortion. No findings suspicious for malignancy in the RIGHT breast. Mammographic images were processed with CAD. On physical exam, there is a palpable ridge of tissue in the UPPER OUTER QUADRANT in the area of focal pain and palpable lumpiness, though I do not palpate a discrete mass. The patient describes tenderness to palpation in this location. Targeted RIGHT breast ultrasound is performed, showing normal dense fibroglandular tissue in the UPPER OUTER QUADRANT at the 11 o'clock position approximately 8 cm from the nipple in the area of clinical concern. No cyst, solid mass or abnormal acoustic shadowing is identified. IMPRESSION: 1. No mammographic or sonographic evidence of malignancy involving the RIGHT breast. 2. Normal dense fibroglandular tissue is present in the UPPER OUTER QUADRANT of the RIGHT breast in the area of focal pain and palpable concern. RECOMMENDATION: Annual BILATERAL screening mammography which is due in late January, 2020. Strategies for alleviating breast pain  including decreasing caffeine intake, vitamin-E supplementation, and avoidance of tight compression brassieres, including underwire brassieres, were discussed with the patient. I have discussed the findings and recommendations with the patient. Results were also provided in writing at the conclusion of the visit. If applicable, a reminder letter will be sent to the patient regarding the next appointment. BI-RADS CATEGORY  1: Negative. Electronically Signed   By: Evangeline Dakin M.D.   On: 06/30/2017 11:29   Mm Diag Breast Tomo Uni Right  Result Date: 06/30/2017 CLINICAL DATA:  47 year old with focal pain and tenderness in the UPPER OUTER QUADRANT of the RIGHT breast with associated lumpiness in this location and a possible palpable lump on recent clinical examination. EXAM: DIGITAL DIAGNOSTIC RIGHT MAMMOGRAM WITH CAD AND TOMO ULTRASOUND RIGHT BREAST COMPARISON:  Mammography 03/18/2017, 03/19/2016 and earlier. RIGHT breast ultrasound 01/31/2011. ACR Breast Density Category c: The breast tissue is heterogeneously dense, which may obscure small masses. FINDINGS: Tomosynthesis and synthesized full field CC and MLO views of the RIGHT breast were obtained. Tomosynthesis and synthesized spot compression tangential view of the area of concern in the RIGHT breast was also obtained. Spot tangential images demonstrate normal fibroglandular tissue in the UPPER OUTER QUADRANT at POSTERIOR depth in the area of focal pain and palpable concern. There is no underlying mass or architectural distortion. No findings suspicious for malignancy in the RIGHT breast. Mammographic images were processed with CAD. On physical exam, there is a palpable ridge of tissue in the UPPER OUTER QUADRANT in the area of focal pain and palpable lumpiness, though I do not palpate a discrete mass. The patient describes tenderness to palpation in this location. Targeted RIGHT breast ultrasound is performed, showing normal dense fibroglandular tissue in  the UPPER OUTER QUADRANT at the 11 o'clock position approximately 8 cm from the nipple in the area of clinical concern. No cyst, solid mass or abnormal acoustic shadowing is identified. IMPRESSION: 1. No mammographic or sonographic evidence of malignancy involving the RIGHT breast. 2. Normal dense fibroglandular tissue is present in the UPPER OUTER QUADRANT of the RIGHT breast in the area of focal pain and palpable concern. RECOMMENDATION: Annual BILATERAL screening mammography which is due in late January, 2020. Strategies for alleviating breast pain including decreasing caffeine intake, vitamin-E supplementation, and avoidance of tight compression brassieres, including underwire brassieres, were discussed with the patient. I have discussed the findings and recommendations with the patient. Results were also  provided in writing at the conclusion of the visit. If applicable, a reminder letter will be sent to the patient regarding the next appointment. BI-RADS CATEGORY  1: Negative. Electronically Signed   By: Evangeline Dakin M.D.   On: 06/30/2017 11:29    No results found.  No results found.    Assessment and Plan: Patient Active Problem List   Diagnosis Date Noted  . Family history of ovarian cancer 03/12/2016  . Vitamin B12 deficiency (non anemic) 11/18/2014  . Constipation   . Asthma   . Allergic rhinitis   . Depression   . Anxiety   . Hematuria   . Insomnia   . GERD without esophagitis    1. Mild intermittent asthma with acute exacerbation Continue using medications as prescribed.  She is currently stable and doing well on current medication regimen.  2. Seasonal allergic rhinitis due to pollen Stable, continue allergy shots as prescribed.  Patient denies any overt symptoms and states good effects from injection.  3. Primary insomnia Refill patient's Ambien at this visit. - zolpidem (AMBIEN) 10 MG tablet; Take 1 tablet (10 mg total) by mouth at bedtime as needed.  Dispense: 30  tablet; Refill: 2 Refilled Controlled medications today. Reviewed risks and possible side effects associated with taking Stimulants. Combination of these drugs with other psychotropic medications could cause dizziness and drowsiness. Pt needs to Monitor symptoms and exercise caution in driving and operating heavy machinery to avoid damages to oneself, to others and to the surroundings. Patient verbalized understanding in this matter. Dependence and abuse for these drugs will be monitored closely. A Controlled substance policy and procedure is on file which allows [facility] to order a urine drug screen test at any visit. Patient understands and agrees with the plan..  4. GERD without esophagitis Stable continue using medications as prescribed.  5. SOB (shortness of breath) Patient's FEV1 is 2.3 which is 70% of the predicted value.  This is much improved from last spirometry.  Patient here to be doing well on current inhaler and medication regimen. - Spirometry with Graph   General Counseling: I have discussed the findings of the evaluation and examination with Elody.  I have also discussed any further diagnostic evaluation thatmay be needed or ordered today. Reese verbalizes understanding of the findings of todays visit. We also reviewed her medications today and discussed drug interactions and side effects including but not limited excessive drowsiness and altered mental states. We also discussed that there is always a risk not just to her but also people around her. she has been encouraged to call the office with any questions or concerns that should arise related to todays visit.    Time spent: 25 This patient was seen by Orson Gear AGNP-C in Collaboration with Dr. Devona Konig as a part of collaborative care agreement.   I have personally obtained a history, examined the patient, evaluated laboratory and imaging results, formulated the assessment and plan and placed orders.     Allyne Gee, MD [facility] Pulmonary and Critical Care Sleep medicine

## 2018-02-08 NOTE — Patient Instructions (Signed)

## 2018-02-23 ENCOUNTER — Other Ambulatory Visit: Payer: Self-pay | Admitting: Family Medicine

## 2018-02-23 NOTE — Telephone Encounter (Signed)
Refill request for bupropion; last office visit 03/24/17; contacted [place], Pharmacy Technician at [facility], and she verified that last refill date of 11/12/2017 #90; will attempt to contact pt to schedule appointment.

## 2018-02-23 NOTE — Telephone Encounter (Signed)
Attempted to contact pt to schedule appointment; left message on voicemail [phone]; will grant 30 day refill per protocol to cover the pt until next office visit. Requested Prescriptions  Pending Prescriptions Disp Refills  . buPROPion (WELLBUTRIN XL) 150 MG 24 hr tablet [Pharmacy Med Name: BUPROPION XL 150MG  TABLETS (24 H)] 90 tablet 1    Sig: TAKE 1 TABLET(150 MG) BY MOUTH DAILY     Psychiatry: Antidepressants - bupropion Failed - 02/23/2018 10:54 AM      Failed - Valid encounter within last 6 months    Recent Outpatient Visits          8 months ago Left ear pain   [facility] [place], [place]   11 months ago Routine general medical examination at a health care facility   [facility], [place], DO   12 months ago Acute maxillary sinusitis, recurrence not specified   [facility] [place], [place]   1 year ago Acute recurrent maxillary sinusitis   [facility] Kathrine Haddock, NP   1 year ago Annual physical exam   [facility], [place], [place]             Passed - Completed PHQ-2 or PHQ-9 in the last 360 days.      Passed - Last BP in normal range    BP Readings from Last 1 Encounters:  02/04/18 122/72

## 2018-03-02 ENCOUNTER — Telehealth: Payer: Self-pay

## 2018-03-02 ENCOUNTER — Other Ambulatory Visit: Payer: Self-pay

## 2018-03-02 IMAGING — MG MM DIGITAL SCREENING BILAT W/ TOMO W/ CAD
8 of 12 series · 8 of 28 positions shown · non-contrast
Comparison: Previous exam(s).

CLINICAL DATA: Screening.

EXAM:
2D DIGITAL SCREENING BILATERAL MAMMOGRAM WITH 3D TOMO WITH CAD

[R MLO synth-2D]
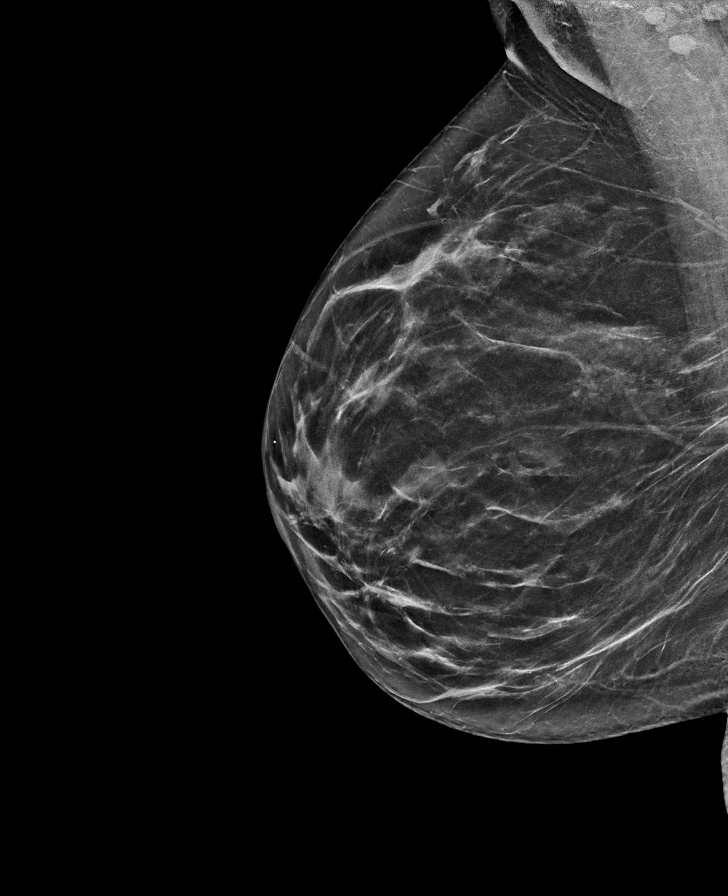

[L CC]
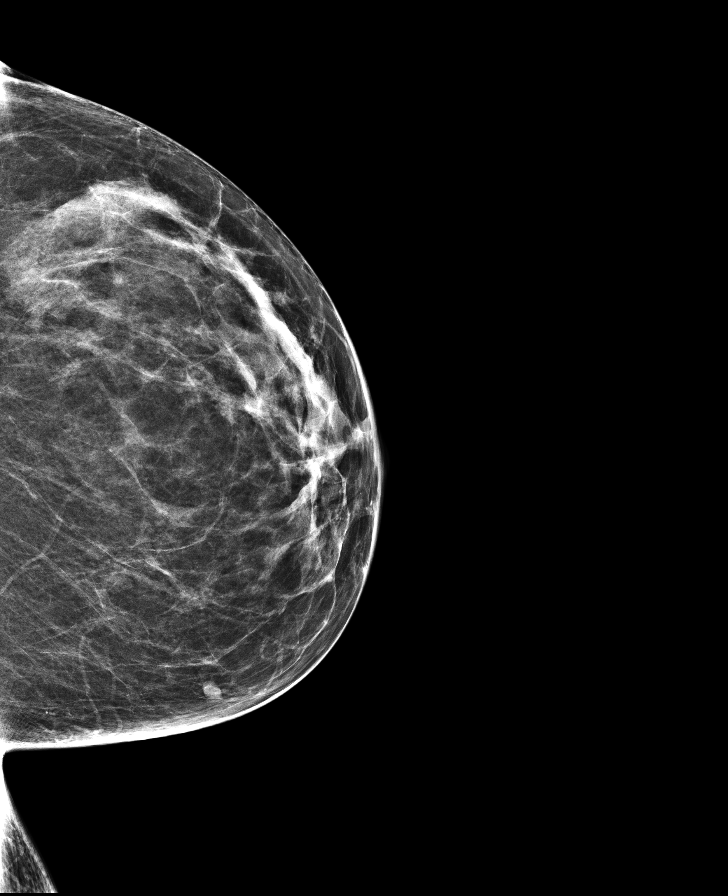

[L MLO synth-2D]
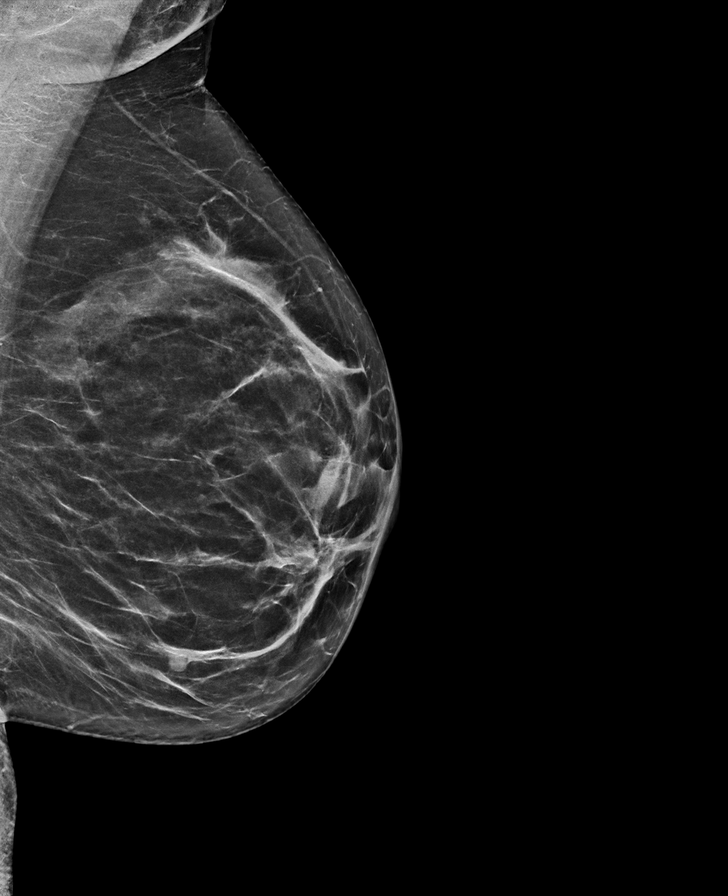

[R MLO]
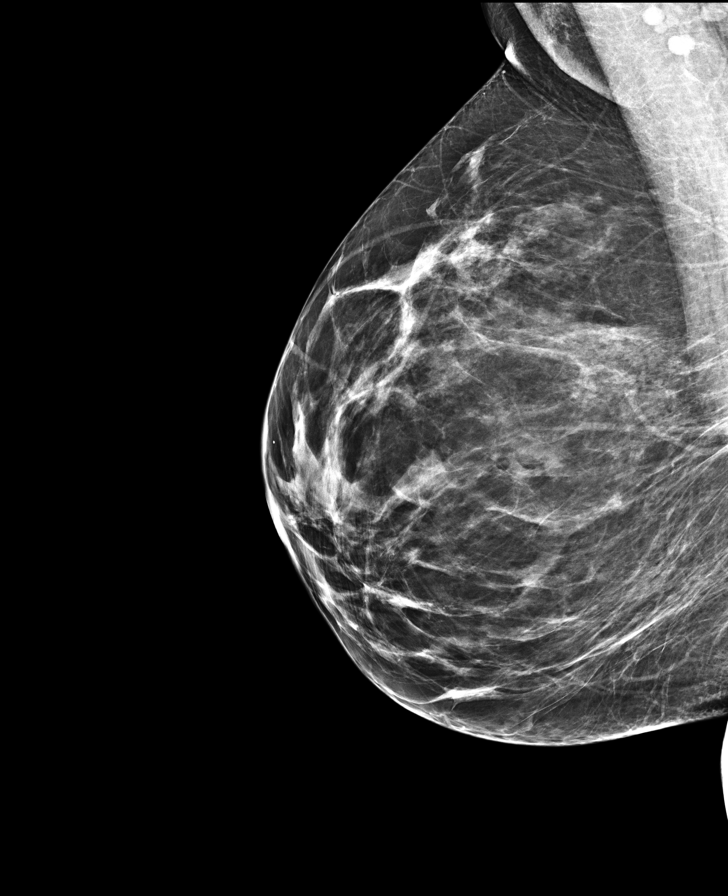

[L MLO]
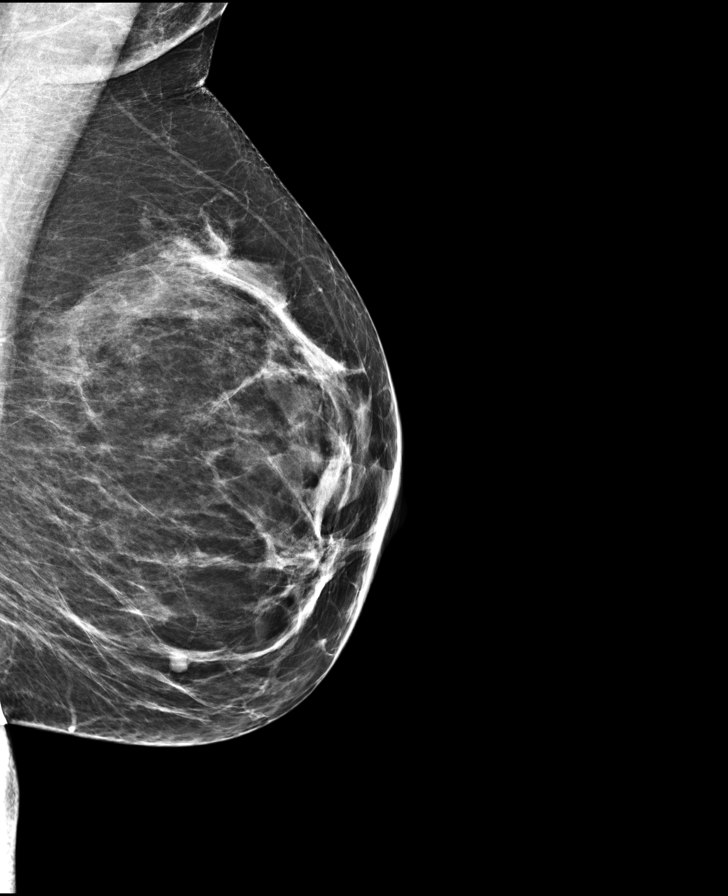

[R CC synth-2D]
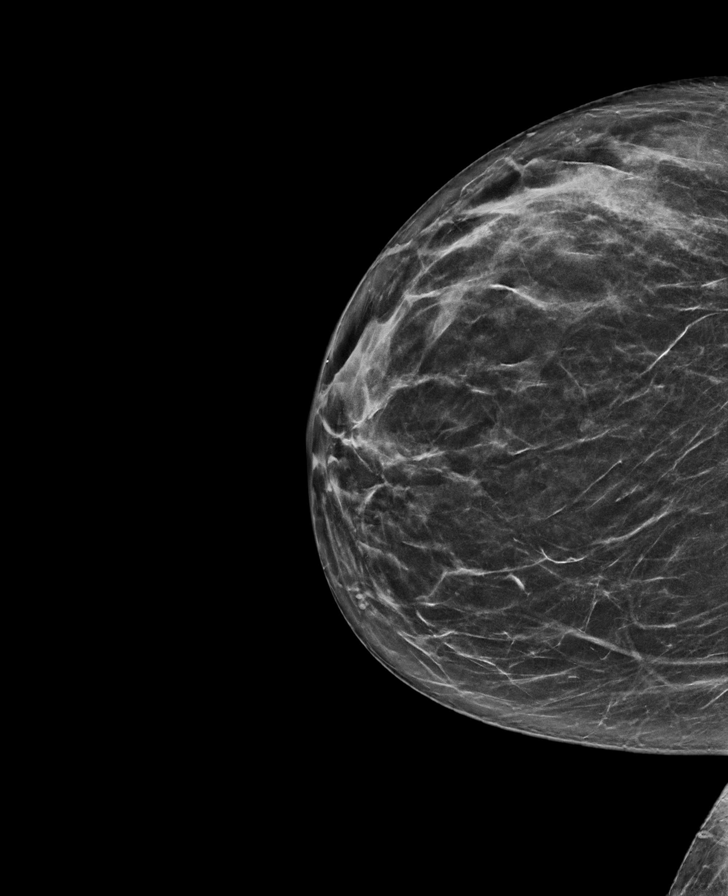

[L CC synth-2D]
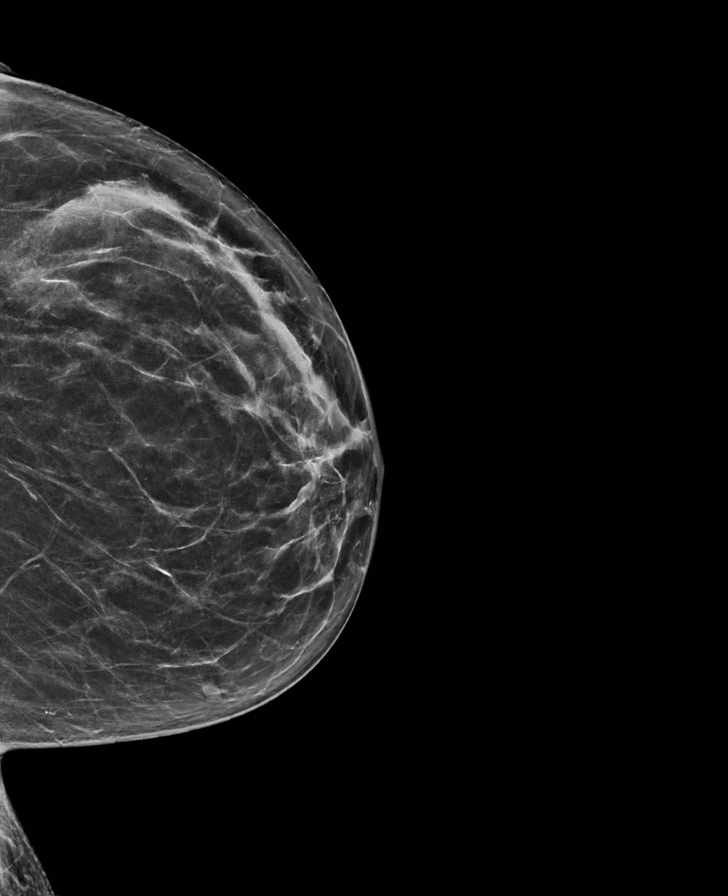

[R CC]
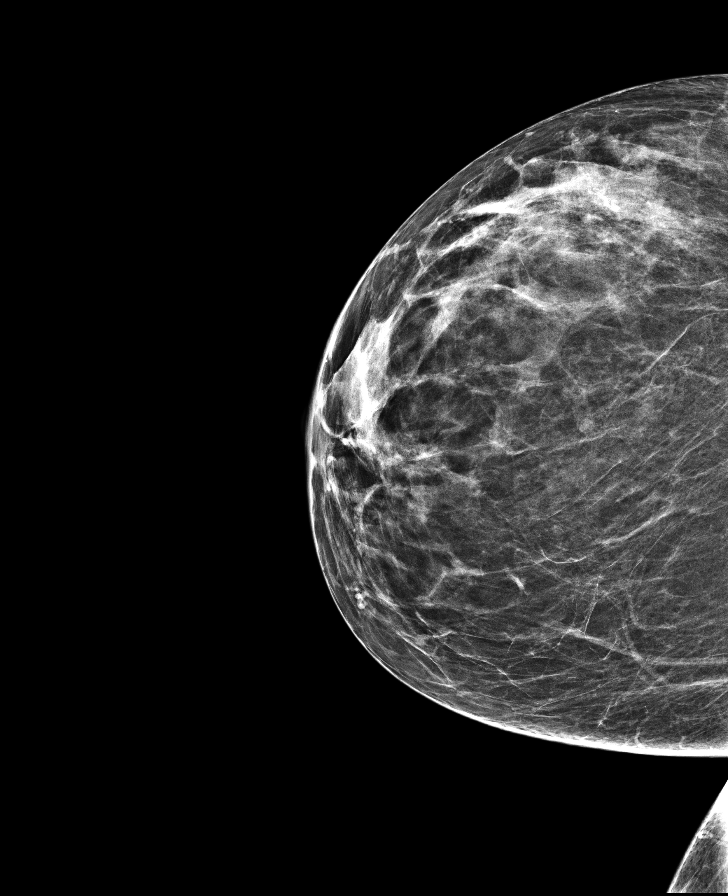

[8 of 28 positions shown; findings below may reference images not displayed]

ACR Breast Density Category c: The breast tissue is heterogeneously
dense, which may obscure small masses.
FINDINGS: There are no findings suspicious for malignancy. Images were
processed with CAD.
IMPRESSION: No mammographic evidence of malignancy. A result letter of this
screening mammogram will be mailed directly to the patient.

RECOMMENDATION:
Screening mammogram in one year. (Code:UA-9-KQN)

BI-RADS CATEGORY  1: Negative.

## 2018-03-02 MED ORDER — AZITHROMYCIN 250 MG PO TABS: ORAL_TABLET | ORAL | 0 refills | Status: DC

## 2018-03-02 NOTE — Telephone Encounter (Signed)
Pt advised we send zpak if not felling better need to been seen

## 2018-03-17 ENCOUNTER — Other Ambulatory Visit: Payer: Self-pay | Admitting: Internal Medicine

## 2018-03-19 ENCOUNTER — Other Ambulatory Visit: Payer: Self-pay | Admitting: Internal Medicine

## 2018-03-25 ENCOUNTER — Ambulatory Visit
Admission: RE | Admit: 2018-03-25 | Discharge: 2018-03-25 | Disposition: A | Discharge status: Home / Self Care | Payer: BLUE CROSS/BLUE SHIELD | Source: Ambulatory Visit | Attending: Obstetrics & Gynecology | Admitting: Obstetrics & Gynecology

## 2018-03-25 ENCOUNTER — Other Ambulatory Visit: Payer: Self-pay | Admitting: Obstetrics & Gynecology

## 2018-03-25 DIAGNOSIS — Z1231 Encounter for screening mammogram for malignant neoplasm of breast: Secondary | ICD-10-CM

## 2018-03-25 DIAGNOSIS — Z01419 Encounter for gynecological examination (general) (routine) without abnormal findings: Secondary | ICD-10-CM | POA: Diagnosis not present

## 2018-03-25 DIAGNOSIS — Z1239 Encounter for other screening for malignant neoplasm of breast: Secondary | ICD-10-CM | POA: Diagnosis not present

## 2018-03-25 DIAGNOSIS — Z8041 Family history of malignant neoplasm of ovary: Secondary | ICD-10-CM | POA: Diagnosis not present

## 2018-03-25 DIAGNOSIS — Z8639 Personal history of other endocrine, nutritional and metabolic disease: Secondary | ICD-10-CM | POA: Diagnosis not present

## 2018-03-29 ENCOUNTER — Encounter: Payer: BLUE CROSS/BLUE SHIELD | Admitting: Family Medicine

## 2018-04-12 ENCOUNTER — Encounter: Payer: BLUE CROSS/BLUE SHIELD | Admitting: Family Medicine

## 2018-04-18 NOTE — Progress Notes (Addendum)
BP 121/79   Pulse 80   Temp 98.1 F (36.7 C) (Oral)   Ht 5\' 8"  (1.727 m)   Wt 192 lb 9.6 oz (87.4 kg)   SpO2 97%   BMI 29.28 kg/m    Subjective:    Patient ID: [name], female    DOB: [birth year], 48 y.o.   MRN: [id number]  HPI: [name] is a 48 y.o. female presenting on 04/19/2018 for comprehensive medical examination. Current medical complaints include:  DEPRESSION Mood status: controlled Satisfied with current treatment?: yes Symptom severity: mild  Duration of current treatment : chronic Side effects: no Medication compliance: excellent compliance Psychotherapy/counseling: no  Previous psychiatric medications: lexapro, wellbutrin Depressed mood: no Anxious mood: no Anhedonia: no Significant weight loss or gain: no Insomnia: yes  Fatigue: yes Feelings of worthlessness or guilt: no Impaired concentration/indecisiveness: no Suicidal ideations: no Hopelessness: no Crying spells: no Depression screen [facility] 2/9 04/19/2018 08/03/2017 03/24/2017 02/29/2016 05/18/2015  Decreased Interest 0 0 1 0 0  Down, Depressed, Hopeless 0 0 1 0 0  PHQ - 2 Score 0 0 2 0 0  Altered sleeping 1 - 3 - -  Tired, decreased energy 1 - 3 - -  Change in appetite 0 - 0 - -  Feeling bad or failure about yourself  0 - 0 - -  Trouble concentrating 0 - 0 - -  Moving slowly or fidgety/restless 0 - 0 - -  Suicidal thoughts 0 - 0 - -  PHQ-9 Score 2 - 8 - -  Difficult doing work/chores Not difficult at all - - - -   INSOMNIA Duration: chronic Satisfied with sleep quality: yes Difficulty falling asleep: no Difficulty staying asleep: no Waking a few hours after sleep onset: no Early morning awakenings: no Daytime hypersomnolence: no Wakes feeling refreshed: no Good sleep hygiene: no Apnea: no Snoring: no Depressed/anxious mood: no Recent stress: no Restless legs/nocturnal leg cramps: no Chronic pain/arthritis: no History of sleep study: no Treatments attempted: melatonin,  uinsom, benadryl and ambien   She currently lives with: husband Menopausal Symptoms: no  Depression Screen done today and results listed below:  Depression screen [facility] 2/9 04/19/2018 08/03/2017 03/24/2017 02/29/2016 05/18/2015  Decreased Interest 0 0 1 0 0  Down, Depressed, Hopeless 0 0 1 0 0  PHQ - 2 Score 0 0 2 0 0  Altered sleeping 1 - 3 - -  Tired, decreased energy 1 - 3 - -  Change in appetite 0 - 0 - -  Feeling bad or failure about yourself  0 - 0 - -  Trouble concentrating 0 - 0 - -  Moving slowly or fidgety/restless 0 - 0 - -  Suicidal thoughts 0 - 0 - -  PHQ-9 Score 2 - 8 - -  Difficult doing work/chores Not difficult at all - - - -    Past Medical History:  Past Medical History:  Diagnosis Date  . Allergic rhinitis   . Anxiety   . Asthma   . Constipation   . Depression   . Hematuria   . Insomnia   . Reflux     Surgical History:  Past Surgical History:  Procedure Laterality Date  . ABDOMINAL HYSTERECTOMY    . APPENDECTOMY    . OVARIAN CYST DRAINAGE Right Nov. 2003  . TONSILLECTOMY    . TUBAL LIGATION      Medications:  Current Outpatient Medications on File Prior to Visit  Medication Sig  . BLISOVI 24 FE  1-20 MG-MCG(24) tablet Take 1 tablet by mouth daily.  . cetirizine (ZYRTEC) 10 MG tablet Take 10 mg by mouth daily.  . cyanocobalamin 2000 MCG tablet Take 2,000 mcg by mouth daily.  . fluticasone (FLONASE) 50 MCG/ACT nasal spray instill 2 sprays into each nostril once daily  . Vitamin D, Ergocalciferol, (DRISDOL) 50000 units CAPS capsule Take 50,000 Units by mouth once a week.   No current facility-administered medications on file prior to visit.     Allergies:  Allergies  Allergen Reactions  . Penicillins Anaphylaxis  . Sulfa Antibiotics Other (See Comments)    Bad taste in mouth    Social History:  Social History   Socioeconomic History  . Marital status: Divorced    Spouse name: Not on file  . Number of children: Not on file  . Years of  education: Not on file  . Highest education level: Not on file  Occupational History  . Not on file  Social Needs  . Financial resource strain: Not on file  . Food insecurity:    Worry: Not on file    Inability: Not on file  . Transportation needs:    Medical: Not on file    Non-medical: Not on file  Tobacco Use  . Smoking status: Never Smoker  . Smokeless tobacco: Never Used  Substance and Sexual Activity  . Alcohol use: Yes    Comment: occasional  . Drug use: No  . Sexual activity: Not on file  Lifestyle  . Physical activity:    Days per week: Not on file    Minutes per session: Not on file  . Stress: Not on file  Relationships  . Social connections:    Talks on phone: Not on file    Gets together: Not on file    Attends religious service: Not on file    Active member of club or organization: Not on file    Attends meetings of clubs or organizations: Not on file    Relationship status: Not on file  . Intimate partner violence:    Fear of current or ex partner: Not on file    Emotionally abused: Not on file    Physically abused: Not on file    Forced sexual activity: Not on file  Other Topics Concern  . Not on file  Social History Narrative  . Not on file   Social History   Tobacco Use  Smoking Status Never Smoker  Smokeless Tobacco Never Used   Social History   Substance and Sexual Activity  Alcohol Use Yes   Comment: occasional    Family History:  Family History  Problem Relation Age of Onset  . Cancer Mother        ovarian  . Stroke Father   . Cancer Maternal Uncle        lung  . Diabetes Maternal Grandmother   . Emphysema Maternal Grandfather   . Diabetes Paternal Grandmother   . Stroke Paternal Grandfather   . Breast cancer Neg Hx     Past medical history, surgical history, medications, allergies, family history and social history reviewed with patient today and changes made to appropriate areas of the chart.   Review of Systems    Constitutional: Positive for malaise/fatigue. Negative for chills, diaphoresis, fever and weight loss.  HENT: Negative.   Eyes: Negative.   Respiratory: Negative.   Cardiovascular: Negative.   Gastrointestinal: Negative.   Genitourinary: Negative.   Musculoskeletal: Negative.   Skin: Negative.  Neurological: Negative.   Endo/Heme/Allergies: Negative.   Psychiatric/Behavioral: Negative for depression, hallucinations, memory loss, substance abuse and suicidal ideas. The patient has insomnia. The patient is not nervous/anxious.     All other ROS negative except what is listed above and in the HPI.      Objective:    BP 121/79   Pulse 80   Temp 98.1 F (36.7 C) (Oral)   Ht 5\' 8"  (1.727 m)   Wt 192 lb 9.6 oz (87.4 kg)   SpO2 97%   BMI 29.28 kg/m   Wt Readings from Last 3 Encounters:  04/19/18 192 lb 9.6 oz (87.4 kg)  02/04/18 193 lb (87.5 kg)  08/03/17 188 lb 9.6 oz (85.5 kg)    Physical Exam Vitals signs and nursing note reviewed.  Constitutional:      General: She is not in acute distress.    Appearance: Normal appearance. She is not ill-appearing, toxic-appearing or diaphoretic.  HENT:     Head: Normocephalic and atraumatic.     Right Ear: Tympanic membrane, ear canal and external ear normal. There is no impacted cerumen.     Left Ear: Tympanic membrane, ear canal and external ear normal. There is no impacted cerumen.     Nose: Nose normal. No congestion or rhinorrhea.     Mouth/Throat:     Mouth: Mucous membranes are moist.     Pharynx: Oropharynx is clear. No oropharyngeal exudate or posterior oropharyngeal erythema.  Eyes:     General: No scleral icterus.       Right eye: No discharge.        Left eye: No discharge.     Extraocular Movements: Extraocular movements intact.     Conjunctiva/sclera: Conjunctivae normal.     Pupils: Pupils are equal, round, and reactive to light.  Neck:     Musculoskeletal: Normal range of motion and neck supple. No neck rigidity  or muscular tenderness.     Vascular: No carotid bruit.  Cardiovascular:     Rate and Rhythm: Normal rate and regular rhythm.     Pulses: Normal pulses.     Heart sounds: No murmur. No friction rub. No gallop.   Pulmonary:     Effort: Pulmonary effort is normal. No respiratory distress.     Breath sounds: Normal breath sounds. No stridor. No wheezing, rhonchi or rales.  Chest:     Chest wall: No tenderness.  Abdominal:     General: Abdomen is flat. Bowel sounds are normal. There is no distension.     Palpations: Abdomen is soft. There is no mass.     Tenderness: There is no abdominal tenderness. There is no right CVA tenderness, left CVA tenderness, guarding or rebound.     Hernia: No hernia is present.  Genitourinary:    Comments: Breast and pelvic exams deferred with shared decision making done at GYN Musculoskeletal:        General: No swelling, tenderness, deformity or signs of injury.     Right lower leg: No edema.     Left lower leg: No edema.  Lymphadenopathy:     Cervical: No cervical adenopathy.  Skin:    General: Skin is warm and dry.     Capillary Refill: Capillary refill takes less than 2 seconds.     Coloration: Skin is not jaundiced or pale.     Findings: No bruising, erythema, lesion or rash.  Neurological:     General: No focal deficit present.     Mental  Status: She is alert and oriented to person, place, and time. Mental status is at baseline.     Cranial Nerves: No cranial nerve deficit.     Sensory: No sensory deficit.     Motor: No weakness.     Coordination: Coordination normal.     Gait: Gait normal.     Deep Tendon Reflexes: Reflexes normal.  Psychiatric:        Mood and Affect: Mood normal.        Behavior: Behavior normal.        Thought Content: Thought content normal.        Judgment: Judgment normal.     Results for orders placed or performed in visit on 03/24/17  Microscopic Examination  Result Value Ref Range   WBC, UA 0-5 0 - 5 /hpf    RBC, UA 0-2 0 - 2 /hpf   Epithelial Cells (non renal) 0-10 0 - 10 /hpf   Bacteria, UA Few (A) None seen/Few  CBC with Differential/Platelet  Result Value Ref Range   WBC 6.7 3.4 - 10.8 x10E3/uL   RBC 3.85 3.77 - 5.28 x10E6/uL   Hemoglobin 12.1 11.1 - 15.9 g/dL   Hematocrit 35.2 34.0 - 46.6 %   MCV 91 79 - 97 fL   MCH 31.4 26.6 - 33.0 pg   MCHC 34.4 31.5 - 35.7 g/dL   RDW 12.9 12.3 - 15.4 %   Platelets 277 150 - 379 x10E3/uL   Neutrophils 49 Not Estab. %   Lymphs 39 Not Estab. %   Monocytes 5 Not Estab. %   Eos 6 Not Estab. %   Basos 1 Not Estab. %   Neutrophils Absolute 3.3 1.4 - 7.0 x10E3/uL   Lymphocytes Absolute 2.6 0.7 - 3.1 x10E3/uL   Monocytes Absolute 0.3 0.1 - 0.9 x10E3/uL   EOS (ABSOLUTE) 0.4 0.0 - 0.4 x10E3/uL   Basophils Absolute 0.1 0.0 - 0.2 x10E3/uL   Immature Granulocytes 0 Not Estab. %   Immature Grans (Abs) 0.0 0.0 - 0.1 x10E3/uL  Comprehensive metabolic panel  Result Value Ref Range   Glucose 92 65 - 99 mg/dL   BUN 12 6 - 24 mg/dL   Creatinine, Ser 0.66 0.57 - 1.00 mg/dL   GFR calc non Af Amer 105 >59 mL/min/1.73   GFR calc Af Amer 121 >59 mL/min/1.73   BUN/Creatinine Ratio 18 9 - 23   Sodium 141 134 - 144 mmol/L   Potassium 4.3 3.5 - 5.2 mmol/L   Chloride 103 96 - 106 mmol/L   CO2 22 20 - 29 mmol/L   Calcium 10.1 8.7 - 10.2 mg/dL   Total Protein 7.1 6.0 - 8.5 g/dL   Albumin 4.7 3.5 - 5.5 g/dL   Globulin, Total 2.4 1.5 - 4.5 g/dL   Albumin/Globulin Ratio 2.0 1.2 - 2.2   Bilirubin Total <0.2 0.0 - 1.2 mg/dL   Alkaline Phosphatase 51 39 - 117 IU/L   AST 10 0 - 40 IU/L   ALT 7 0 - 32 IU/L  Lipid Panel w/o Chol/HDL Ratio  Result Value Ref Range   Cholesterol, Total 220 (H) 100 - 199 mg/dL   Triglycerides 292 (H) 0 - 149 mg/dL   HDL 62 >39 mg/dL   VLDL Cholesterol Cal 58 (H) 5 - 40 mg/dL   LDL Calculated 100 (H) 0 - 99 mg/dL  UA/M w/rflx Culture, Routine  Result Value Ref Range   Specific Gravity, UA 1.025 1.005 - 1.030   pH, UA 5.0 5.0 -  7.5    Color, UA Yellow Yellow   Appearance Ur Clear Clear   Leukocytes, UA Negative Negative   Protein, UA Negative Negative/Trace   Glucose, UA Negative Negative   Ketones, UA Negative Negative   RBC, UA 1+ (A) Negative   Bilirubin, UA Negative Negative   Urobilinogen, Ur 0.2 0.2 - 1.0 mg/dL   Nitrite, UA Negative Negative   Microscopic Examination See below:   B12 and Folate Panel  Result Value Ref Range   Vitamin B-12 1,204 232 - 1,245 pg/mL   Folate 11.0 >3.0 ng/mL  HM PAP SMEAR  Result Value Ref Range   HM Pap smear Negative, negative HPV   VITAMIN D 25 Hydroxy (Vit-D Deficiency, Fractures)  Result Value Ref Range   Vit D, 25-Hydroxy 35.4 30.0 - 100.0 ng/mL  Thyroid Panel With TSH  Result Value Ref Range   TSH 0.976 0.450 - 4.500 uIU/mL   T4, Total 6.2 4.5 - 12.0 ug/dL   T3 Uptake Ratio 20 (L) 24 - 39 %   Free Thyroxine Index 1.2 1.2 - 4.9      Assessment & Plan:   Problem List Items Addressed This Visit      Respiratory   Asthma    Under good control on current regimen. Continue current regimen. Continue to monitor. Call with any concerns. Refills given. Labs drawn today.       Relevant Medications   VENTOLIN HFA 108 (90 Base) MCG/ACT inhaler   roflumilast (DALIRESP) 500 MCG TABS tablet   montelukast (SINGULAIR) 10 MG tablet   Other Relevant Orders   CBC with Differential/Platelet   Comprehensive metabolic panel   TSH     Digestive   GERD without esophagitis    Under good control on current regimen. Continue current regimen. Continue to monitor. Call with any concerns. Refills given. Labs drawn today      Relevant Orders   CBC with Differential/Platelet   Comprehensive metabolic panel   TSH     Other   Depression    Has been more tired, but mood doing well. Will increase her wellbutrin and consider decreasing her lexapro next visit. Call with any concerns. Recheck 1-3 months.       Relevant Medications   buPROPion (WELLBUTRIN XL) 300 MG 24 hr tablet    escitalopram (LEXAPRO) 20 MG tablet   Other Relevant Orders   CBC with Differential/Platelet   Comprehensive metabolic panel   TSH   Anxiety    Has been more tired, but mood doing well. Will increase her wellbutrin and consider decreasing her lexapro next visit. Call with any concerns. Recheck 1-3 months.       Relevant Medications   buPROPion (WELLBUTRIN XL) 300 MG 24 hr tablet   escitalopram (LEXAPRO) 20 MG tablet   Hematuria    Rechecking levels today. Await results. Call with any concerns.       Relevant Orders   CBC with Differential/Platelet   Comprehensive metabolic panel   TSH   UA/M w/rflx Culture, Routine   Insomnia    Stable on current regimen. Refills given today. Recheck 3 months. Call with any concerns.       Relevant Medications   zolpidem (AMBIEN) 10 MG tablet   Other Relevant Orders   CBC with Differential/Platelet   Comprehensive metabolic panel   TSH   Vitamin B12 deficiency (non anemic)    Under good control on current regimen. Continue current regimen. Continue to monitor. Call with any concerns. Refills given.  Labs drawn today      Relevant Orders   CBC with Differential/Platelet   Comprehensive metabolic panel   TSH   C16 and Folate Panel    Other Visit Diagnoses    Routine general medical examination at a health care facility    -  Primary   Vaccines up to date. Screening labs checked today. Pap and mammo up to date. Continue diet and exercise. Call with any concerns.    Relevant Orders   CBC with Differential/Platelet   Comprehensive metabolic panel   Lipid Panel w/o Chol/HDL Ratio   TSH   UA/M w/rflx Culture, Routine   B12 and Folate Panel       Follow up plan: Return 1-3 months , for follow up fatigue/mood.   LABORATORY TESTING:  - Pap smear: up to date  IMMUNIZATIONS:   - Tdap: Tetanus vaccination status reviewed: last tetanus booster within 10 years. - Influenza: Refused  SCREENING: -Mammogram: Up to date   PATIENT  COUNSELING:   Advised to take 1 mg of folate supplement per day if capable of pregnancy.   Sexuality: Discussed sexually transmitted diseases, partner selection, use of condoms, avoidance of unintended pregnancy  and contraceptive alternatives.   Advised to avoid cigarette smoking.  I discussed with the patient that most people either abstain from alcohol or drink within safe limits (<=14/week and <=4 drinks/occasion for males, <=7/weeks and <= 3 drinks/occasion for females) and that the risk for alcohol disorders and other health effects rises proportionally with the number of drinks per week and how often a drinker exceeds daily limits.  Discussed cessation/primary prevention of drug use and availability of treatment for abuse.   Diet: Encouraged to adjust caloric intake to maintain  or achieve ideal body weight, to reduce intake of dietary saturated fat and total fat, to limit sodium intake by avoiding high sodium foods and not adding table salt, and to maintain adequate dietary potassium and calcium preferably from fresh fruits, vegetables, and low-fat dairy products.    stressed the importance of regular exercise  Injury prevention: Discussed safety belts, safety helmets, smoke detector, smoking near bedding or upholstery.   Dental health: Discussed importance of regular tooth brushing, flossing, and dental visits.    NEXT PREVENTATIVE PHYSICAL DUE IN 1 YEAR. Return 1-3 months , for follow up fatigue/mood.

## 2018-04-19 ENCOUNTER — Encounter: Payer: Self-pay | Admitting: Family Medicine

## 2018-04-19 ENCOUNTER — Ambulatory Visit (INDEPENDENT_AMBULATORY_CARE_PROVIDER_SITE_OTHER): Payer: BLUE CROSS/BLUE SHIELD | Admitting: Family Medicine

## 2018-04-19 VITALS — BP 121/79 | HR 80 | Temp 98.1°F | Ht 68.0 in | Wt 192.6 lb

## 2018-04-19 DIAGNOSIS — K219 Gastro-esophageal reflux disease without esophagitis: Secondary | ICD-10-CM

## 2018-04-19 DIAGNOSIS — J4521 Mild intermittent asthma with (acute) exacerbation: Secondary | ICD-10-CM | POA: Diagnosis not present

## 2018-04-19 DIAGNOSIS — R319 Hematuria, unspecified: Secondary | ICD-10-CM | POA: Diagnosis not present

## 2018-04-19 DIAGNOSIS — F5101 Primary insomnia: Secondary | ICD-10-CM

## 2018-04-19 DIAGNOSIS — Z0000 Encounter for general adult medical examination without abnormal findings: Secondary | ICD-10-CM | POA: Diagnosis not present

## 2018-04-19 DIAGNOSIS — E538 Deficiency of other specified B group vitamins: Secondary | ICD-10-CM

## 2018-04-19 DIAGNOSIS — F330 Major depressive disorder, recurrent, mild: Secondary | ICD-10-CM | POA: Diagnosis not present

## 2018-04-19 DIAGNOSIS — F419 Anxiety disorder, unspecified: Secondary | ICD-10-CM

## 2018-04-19 MED ORDER — ESCITALOPRAM OXALATE 20 MG PO TABS: 20.0000 mg | ORAL_TABLET | Freq: Every day | ORAL | 1 refills | Status: DC

## 2018-04-19 MED ORDER — BUPROPION HCL ER (XL) 300 MG PO TB24: 300.0000 mg | ORAL_TABLET | Freq: Every day | ORAL | 1 refills | Status: DC

## 2018-04-19 MED ORDER — "INSULIN SYRINGE 31G X 5/16"" 1 ML MISC": 3 refills | Status: DC

## 2018-04-19 MED ORDER — ROFLUMILAST 500 MCG PO TABS: 500.0000 ug | ORAL_TABLET | Freq: Every day | ORAL | 1 refills | Status: DC

## 2018-04-19 MED ORDER — MONTELUKAST SODIUM 10 MG PO TABS: 10.0000 mg | ORAL_TABLET | Freq: Every day | ORAL | 3 refills | Status: DC

## 2018-04-19 MED ORDER — ZOLPIDEM TARTRATE 10 MG PO TABS: 10.0000 mg | ORAL_TABLET | Freq: Every evening | ORAL | 2 refills | Status: DC | PRN

## 2018-04-19 MED ORDER — VENTOLIN HFA 108 (90 BASE) MCG/ACT IN AERS: INHALATION_SPRAY | RESPIRATORY_TRACT | 3 refills | Status: DC

## 2018-04-19 NOTE — Assessment & Plan Note (Signed)
Stable on current regimen. Refills given today. Recheck 3 months. Call with any concerns.

## 2018-04-19 NOTE — Assessment & Plan Note (Signed)
Under good control on current regimen. Continue current regimen. Continue to monitor. Call with any concerns. Refills given. Labs drawn today.   

## 2018-04-19 NOTE — Patient Instructions (Signed)

## 2018-04-19 NOTE — Assessment & Plan Note (Signed)
Has been more tired, but mood doing well. Will increase her wellbutrin and consider decreasing her lexapro next visit. Call with any concerns. Recheck 1-3 months.

## 2018-04-19 NOTE — Assessment & Plan Note (Signed)
Rechecking levels today. Await results. Call with any concerns.  

## 2018-04-20 LAB — COMPREHENSIVE METABOLIC PANEL
ALT: 13 IU/L (ref 0–32)
AST: 15 IU/L (ref 0–40)
Albumin/Globulin Ratio: 2.1 (ref 1.2–2.2)
Albumin: 4.5 g/dL (ref 3.8–4.8)
Alkaline Phosphatase: 48 IU/L (ref 39–117)
BUN/Creatinine Ratio: 16 (ref 9–23)
BUN: 12 mg/dL (ref 6–24)
Bilirubin Total: <0.2 mg/dL (ref 0.0–1.2)
CO2: 20 mmol/L (ref 20–29)
CREATININE: 0.77 mg/dL (ref 0.57–1.00)
Calcium: 9.4 mg/dL (ref 8.7–10.2)
Chloride: 102 mmol/L (ref 96–106)
GFR calc non Af Amer: 91 mL/min/{1.73_m2} (ref >59)
GFR, EST AFRICAN AMERICAN: 105 mL/min/{1.73_m2} (ref >59)
Globulin, Total: 2.1 g/dL (ref 1.5–4.5)
Glucose: 84 mg/dL (ref 65–99)
Potassium: 4.5 mmol/L (ref 3.5–5.2)
Sodium: 138 mmol/L (ref 134–144)
Total Protein: 6.6 g/dL (ref 6.0–8.5)

## 2018-04-20 LAB — CBC WITH DIFFERENTIAL/PLATELET
Basophils Absolute: 0.1 10*3/uL (ref 0.0–0.2)
Basos: 1 %
EOS (ABSOLUTE): 0.5 10*3/uL — ABNORMAL HIGH (ref 0.0–0.4)
Eos: 7 %
Hematocrit: 35.7 % (ref 34.0–46.6)
Hemoglobin: 12.1 g/dL (ref 11.1–15.9)
Immature Grans (Abs): 0 10*3/uL (ref 0.0–0.1)
Immature Granulocytes: 0 %
Lymphocytes Absolute: 2.4 10*3/uL (ref 0.7–3.1)
Lymphs: 39 %
MCH: 31.3 pg (ref 26.6–33.0)
MCHC: 33.9 g/dL (ref 31.5–35.7)
MCV: 92 fL (ref 79–97)
Monocytes Absolute: 0.3 10*3/uL (ref 0.1–0.9)
Monocytes: 5 %
NEUTROS PCT: 48 %
Neutrophils Absolute: 2.9 10*3/uL (ref 1.4–7.0)
Platelets: 266 10*3/uL (ref 150–450)
RBC: 3.87 x10E6/uL (ref 3.77–5.28)
RDW: 12.1 % (ref 11.7–15.4)
WBC: 6.2 10*3/uL (ref 3.4–10.8)

## 2018-04-20 LAB — B12 AND FOLATE PANEL
Folate: 10.7 ng/mL (ref >3.0)
Vitamin B-12: 893 pg/mL (ref 232–1245)

## 2018-04-20 LAB — LIPID PANEL W/O CHOL/HDL RATIO
Cholesterol, Total: 208 mg/dL — ABNORMAL HIGH (ref 100–199)
HDL: 66 mg/dL (ref >39)
LDL CALC: 108 mg/dL — AB (ref 0–99)
Triglycerides: 169 mg/dL — ABNORMAL HIGH (ref 0–149)
VLDL CHOLESTEROL CAL: 34 mg/dL (ref 5–40)

## 2018-04-20 LAB — TSH: TSH: 1.93 u[IU]/mL (ref 0.450–4.500)

## 2018-04-23 LAB — UA/M W/RFLX CULTURE, ROUTINE
Bilirubin, UA: NEGATIVE
Glucose, UA: NEGATIVE
KETONES UA: NEGATIVE
Leukocytes, UA: NEGATIVE
Nitrite, UA: NEGATIVE
Protein, UA: NEGATIVE
Specific Gravity, UA: 1.025 (ref 1.005–1.030)
Urobilinogen, Ur: 0.2 mg/dL (ref 0.2–1.0)
pH, UA: 5 (ref 5.0–7.5)

## 2018-04-23 LAB — MICROSCOPIC EXAMINATION

## 2018-04-23 LAB — URINE CULTURE, REFLEX

## 2018-05-02 ENCOUNTER — Other Ambulatory Visit: Payer: Self-pay | Admitting: Adult Health

## 2018-05-02 DIAGNOSIS — F5101 Primary insomnia: Secondary | ICD-10-CM

## 2018-05-03 NOTE — Telephone Encounter (Signed)
Last12/2019 and next 6/20 pt see pulmonary every 6 month

## 2018-07-07 DIAGNOSIS — J301 Allergic rhinitis due to pollen: Secondary | ICD-10-CM | POA: Diagnosis not present

## 2018-07-12 ENCOUNTER — Ambulatory Visit (INDEPENDENT_AMBULATORY_CARE_PROVIDER_SITE_OTHER): Payer: BC Managed Care – PPO

## 2018-07-12 ENCOUNTER — Other Ambulatory Visit: Payer: Self-pay

## 2018-07-12 DIAGNOSIS — J301 Allergic rhinitis due to pollen: Secondary | ICD-10-CM

## 2018-07-20 ENCOUNTER — Ambulatory Visit (INDEPENDENT_AMBULATORY_CARE_PROVIDER_SITE_OTHER): Payer: BLUE CROSS/BLUE SHIELD | Admitting: Family Medicine

## 2018-07-20 ENCOUNTER — Encounter: Payer: Self-pay | Admitting: Family Medicine

## 2018-07-20 ENCOUNTER — Other Ambulatory Visit: Payer: Self-pay

## 2018-07-20 VITALS — Temp 98.4°F | Wt 186.0 lb

## 2018-07-20 DIAGNOSIS — F330 Major depressive disorder, recurrent, mild: Secondary | ICD-10-CM

## 2018-07-20 MED ORDER — ESCITALOPRAM OXALATE 20 MG PO TABS: 20.0000 mg | ORAL_TABLET | Freq: Every day | ORAL | 1 refills | Status: DC

## 2018-07-20 MED ORDER — BUPROPION HCL ER (XL) 300 MG PO TB24: 300.0000 mg | ORAL_TABLET | Freq: Every day | ORAL | 1 refills | Status: DC

## 2018-07-20 NOTE — Assessment & Plan Note (Signed)
Under good control on current regimen. Continue current regimen. Continue to monitor. Call with any concerns. Refills given.   

## 2018-07-20 NOTE — Progress Notes (Signed)
Temp 98.4 F (36.9 C) (Oral) Comment: pt reported  Wt 186 lb (84.4 kg) Comment: pt reported  BMI 28.28 kg/m    Subjective:    Patient ID: [name], female    DOB: 1971-02-08, 48 y.o.   MRN: [id number]  HPI: [name] is a 48 y.o. female  Chief Complaint  Patient presents with  . Depression   DEPRESSION Mood status: stable Satisfied with current treatment?: yes Symptom severity: mild  Duration of current treatment : chronic Side effects: no Medication compliance: excellent compliance Psychotherapy/counseling: no  Previous psychiatric medications: lexapro and wellbutrin Depressed mood: no Anxious mood: no Anhedonia: no Significant weight loss or gain: no Insomnia: yes hard to fall asleep Fatigue: yes Feelings of worthlessness or guilt: no Impaired concentration/indecisiveness: no Suicidal ideations: no Hopelessness: no Crying spells: no Depression screen [facility] 2/9 07/20/2018 04/19/2018 08/03/2017 03/24/2017 02/29/2016  Decreased Interest 0 0 0 1 0  Down, Depressed, Hopeless 0 0 0 1 0  PHQ - 2 Score 0 0 0 2 0  Altered sleeping 1 1 - 3 -  Tired, decreased energy 1 1 - 3 -  Change in appetite 0 0 - 0 -  Feeling bad or failure about yourself  0 0 - 0 -  Trouble concentrating 0 0 - 0 -  Moving slowly or fidgety/restless 0 0 - 0 -  Suicidal thoughts 0 0 - 0 -  PHQ-9 Score 2 2 - 8 -  Difficult doing work/chores Not difficult at all Not difficult at all - - -   GAD 7 : Generalized Anxiety Score 07/20/2018 04/19/2018 05/18/2015  Nervous, Anxious, on Edge 0 0 0  Control/stop worrying 0 0 0  Worry too much - different things 0 0 0  Trouble relaxing 0 0 0  Restless 0 0 0  Easily annoyed or irritable 0 0 0  Afraid - awful might happen 0 0 0  Total GAD 7 Score 0 0 0  Anxiety Difficulty Not difficult at all Not difficult at all Not difficult at all   Relevant past medical, surgical, family and social history reviewed and updated as indicated. Interim medical  history since our last visit reviewed. Allergies and medications reviewed and updated.  Review of Systems  Constitutional: Negative.   Respiratory: Negative.   Cardiovascular: Negative.   Musculoskeletal: Negative.   Neurological: Negative.   Psychiatric/Behavioral: Negative.     Per HPI unless specifically indicated above     Objective:    Temp 98.4 F (36.9 C) (Oral) Comment: pt reported  Wt 186 lb (84.4 kg) Comment: pt reported  BMI 28.28 kg/m   Wt Readings from Last 3 Encounters:  07/20/18 186 lb (84.4 kg)  04/19/18 192 lb 9.6 oz (87.4 kg)  02/04/18 193 lb (87.5 kg)    Physical Exam Vitals signs and nursing note reviewed.  Constitutional:      General: She is not in acute distress.    Appearance: Normal appearance. She is not ill-appearing, toxic-appearing or diaphoretic.  HENT:     Head: Normocephalic and atraumatic.     Right Ear: External ear normal.     Left Ear: External ear normal.     Nose: Nose normal.     Mouth/Throat:     Mouth: Mucous membranes are moist.     Pharynx: Oropharynx is clear.  Eyes:     General: No scleral icterus.       Right eye: No discharge.  Left eye: No discharge.     Conjunctiva/sclera: Conjunctivae normal.     Pupils: Pupils are equal, round, and reactive to light.  Neck:     Musculoskeletal: Normal range of motion.  Pulmonary:     Effort: Pulmonary effort is normal. No respiratory distress.     Comments: Speaking in full sentences Musculoskeletal: Normal range of motion.  Skin:    Coloration: Skin is not jaundiced or pale.     Findings: No bruising, erythema, lesion or rash.  Neurological:     Mental Status: She is alert and oriented to person, place, and time. Mental status is at baseline.  Psychiatric:        Mood and Affect: Mood normal.        Behavior: Behavior normal.        Thought Content: Thought content normal.        Judgment: Judgment normal.     Results for orders placed or performed in visit on  04/19/18  Microscopic Examination  Result Value Ref Range   WBC, UA 0-5 0 - 5 /hpf   RBC, UA 0-2 0 - 2 /hpf   Epithelial Cells (non renal) 0-10 0 - 10 /hpf   Bacteria, UA Many (A) None seen/Few  Urine Culture, Reflex  Result Value Ref Range   Urine Culture, Routine CANCELED   CBC with Differential/Platelet  Result Value Ref Range   WBC 6.2 3.4 - 10.8 x10E3/uL   RBC 3.87 3.77 - 5.28 x10E6/uL   Hemoglobin 12.1 11.1 - 15.9 g/dL   Hematocrit 35.7 34.0 - 46.6 %   MCV 92 79 - 97 fL   MCH 31.3 26.6 - 33.0 pg   MCHC 33.9 31.5 - 35.7 g/dL   RDW 12.1 11.7 - 15.4 %   Platelets 266 150 - 450 x10E3/uL   Neutrophils 48 Not Estab. %   Lymphs 39 Not Estab. %   Monocytes 5 Not Estab. %   Eos 7 Not Estab. %   Basos 1 Not Estab. %   Neutrophils Absolute 2.9 1.4 - 7.0 x10E3/uL   Lymphocytes Absolute 2.4 0.7 - 3.1 x10E3/uL   Monocytes Absolute 0.3 0.1 - 0.9 x10E3/uL   EOS (ABSOLUTE) 0.5 (H) 0.0 - 0.4 x10E3/uL   Basophils Absolute 0.1 0.0 - 0.2 x10E3/uL   Immature Granulocytes 0 Not Estab. %   Immature Grans (Abs) 0.0 0.0 - 0.1 x10E3/uL  Comprehensive metabolic panel  Result Value Ref Range   Glucose 84 65 - 99 mg/dL   BUN 12 6 - 24 mg/dL   Creatinine, Ser 0.77 0.57 - 1.00 mg/dL   GFR calc non Af Amer 91 >59 mL/min/1.73   GFR calc Af Amer 105 >59 mL/min/1.73   BUN/Creatinine Ratio 16 9 - 23   Sodium 138 134 - 144 mmol/L   Potassium 4.5 3.5 - 5.2 mmol/L   Chloride 102 96 - 106 mmol/L   CO2 20 20 - 29 mmol/L   Calcium 9.4 8.7 - 10.2 mg/dL   Total Protein 6.6 6.0 - 8.5 g/dL   Albumin 4.5 3.8 - 4.8 g/dL   Globulin, Total 2.1 1.5 - 4.5 g/dL   Albumin/Globulin Ratio 2.1 1.2 - 2.2   Bilirubin Total <0.2 0.0 - 1.2 mg/dL   Alkaline Phosphatase 48 39 - 117 IU/L   AST 15 0 - 40 IU/L   ALT 13 0 - 32 IU/L  Lipid Panel w/o Chol/HDL Ratio  Result Value Ref Range   Cholesterol, Total 208 (H) 100 - 199  mg/dL   Triglycerides 169 (H) 0 - 149 mg/dL   HDL 66 >39 mg/dL   VLDL Cholesterol Cal 34 5 - 40  mg/dL   LDL Calculated 108 (H) 0 - 99 mg/dL  TSH  Result Value Ref Range   TSH 1.930 0.450 - 4.500 uIU/mL  UA/M w/rflx Culture, Routine  Result Value Ref Range   Specific Gravity, UA 1.025 1.005 - 1.030   pH, UA 5.0 5.0 - 7.5   Color, UA Yellow Yellow   Appearance Ur Clear Clear   Leukocytes, UA Negative Negative   Protein, UA Negative Negative/Trace   Glucose, UA Negative Negative   Ketones, UA Negative Negative   RBC, UA 2+ (A) Negative   Bilirubin, UA Negative Negative   Urobilinogen, Ur 0.2 0.2 - 1.0 mg/dL   Nitrite, UA Negative Negative   Microscopic Examination See below:    Urinalysis Reflex Comment   B12 and Folate Panel  Result Value Ref Range   Vitamin B-12 893 232 - 1,245 pg/mL   Folate 10.7 >3.0 ng/mL      Assessment & Plan:   Problem List Items Addressed This Visit      Other   Depression - Primary    Under good control on current regimen. Continue current regimen. Continue to monitor. Call with any concerns. Refills given.        Relevant Medications   escitalopram (LEXAPRO) 20 MG tablet   buPROPion (WELLBUTRIN XL) 300 MG 24 hr tablet       Follow up plan: Return in about 6 months (around 01/20/2019) for Physical.   . This visit was completed via [employer] due to the restrictions of the COVID-19 pandemic. All issues as above were discussed and addressed. Physical exam was done as above through visual confirmation on [employer]. If it was felt that the patient should be evaluated in the office, they were directed there. The patient verbally consented to this visit. . Location of the patient: home . Location of the provider: work . Those involved with this call:  . Provider: Park Liter, DO . CMA: Yvonna Alanis, [place] . Front Desk/Registration: Don Perking  . Time spent on call: 15 minutes with patient face to face via video conference. More than 50% of this time was spent in counseling and coordination of care. 23 minutes total spent in  review of patient's record and preparation of their chart.

## 2018-07-26 DIAGNOSIS — M9901 Segmental and somatic dysfunction of cervical region: Secondary | ICD-10-CM | POA: Diagnosis not present

## 2018-07-26 DIAGNOSIS — M5414 Radiculopathy, thoracic region: Secondary | ICD-10-CM | POA: Diagnosis not present

## 2018-07-26 DIAGNOSIS — R51 Headache: Secondary | ICD-10-CM | POA: Diagnosis not present

## 2018-07-26 DIAGNOSIS — M9902 Segmental and somatic dysfunction of thoracic region: Secondary | ICD-10-CM | POA: Diagnosis not present

## 2018-07-28 DIAGNOSIS — M5414 Radiculopathy, thoracic region: Secondary | ICD-10-CM | POA: Diagnosis not present

## 2018-07-28 DIAGNOSIS — M9901 Segmental and somatic dysfunction of cervical region: Secondary | ICD-10-CM | POA: Diagnosis not present

## 2018-07-28 DIAGNOSIS — M9902 Segmental and somatic dysfunction of thoracic region: Secondary | ICD-10-CM | POA: Diagnosis not present

## 2018-07-28 DIAGNOSIS — R51 Headache: Secondary | ICD-10-CM | POA: Diagnosis not present

## 2018-07-29 ENCOUNTER — Ambulatory Visit (INDEPENDENT_AMBULATORY_CARE_PROVIDER_SITE_OTHER): Payer: BLUE CROSS/BLUE SHIELD | Admitting: Family Medicine

## 2018-07-29 ENCOUNTER — Other Ambulatory Visit: Payer: Self-pay

## 2018-07-29 ENCOUNTER — Encounter: Payer: Self-pay | Admitting: Family Medicine

## 2018-07-29 VITALS — BP 131/83 | HR 71

## 2018-07-29 DIAGNOSIS — R079 Chest pain, unspecified: Secondary | ICD-10-CM

## 2018-07-29 MED ORDER — SUCRALFATE 1 GM/10ML PO SUSP: 1.0000 g | Freq: Three times a day (TID) | ORAL | 0 refills | Status: DC

## 2018-07-29 MED ORDER — OMEPRAZOLE 20 MG PO CPDR: 20.0000 mg | DELAYED_RELEASE_CAPSULE | Freq: Every day | ORAL | 3 refills | Status: DC

## 2018-07-29 NOTE — Progress Notes (Signed)
BP 131/83   Pulse 71    Subjective:    Patient ID: [name], female    DOB: 05-10-1970, 48 y.o.   MRN: [id number]  HPI: [name] is a 48 y.o. female  Chief Complaint  Patient presents with  . Back Pain    2.5 x 3 weeks getting worse. Has seen a chiropractor. Pain comes and goes. Sharp pain   CHEST PAIN- pain in the center of her chest that radiates over into her back Time since onset: 2.5-3 weeks Onset: sudden Quality: sharp Severity: severe Location: sternal into her back at the line of her bra strap Radiation: back Episode duration: a couple of minutes, but the pain initially takes her breath away Frequency: intermittent Related to exertion: no Activity when pain started: at random Trauma: no Anxiety/recent stressors: yes Aggravating factors: unknown Alleviating factors: heating pad Status: stable Treatments attempted: ibuprofen and antacids  Current pain status: palpitations, no pain Shortness of breath: yes Cough: no Nausea: yes Diaphoresis: no Heartburn: no Palpitations: yes   Relevant past medical, surgical, family and social history reviewed and updated as indicated. Interim medical history since our last visit reviewed. Allergies and medications reviewed and updated.  Review of Systems  Constitutional: Negative for activity change, appetite change, chills, diaphoresis, fatigue, fever and unexpected weight change.  HENT: Negative.   Eyes: Negative.   Respiratory: Positive for chest tightness and shortness of breath. Negative for apnea, cough, choking, wheezing and stridor.   Cardiovascular: Positive for chest pain and palpitations. Negative for leg swelling.  Gastrointestinal: Negative.   Musculoskeletal: Positive for back pain. Negative for arthralgias, gait problem, joint swelling, myalgias, neck pain and neck stiffness.  Skin: Negative.   Neurological: Negative.   Psychiatric/Behavioral: Negative.     Per HPI unless specifically  indicated above     Objective:    BP 131/83   Pulse 71   Wt Readings from Last 3 Encounters:  07/20/18 186 lb (84.4 kg)  04/19/18 192 lb 9.6 oz (87.4 kg)  02/04/18 193 lb (87.5 kg)    Physical Exam Vitals signs and nursing note reviewed.  Constitutional:      General: She is not in acute distress.    Appearance: Normal appearance. She is not ill-appearing, toxic-appearing or diaphoretic.  HENT:     Head: Normocephalic and atraumatic.     Right Ear: External ear normal.     Left Ear: External ear normal.     Nose: Nose normal.     Mouth/Throat:     Mouth: Mucous membranes are moist.     Pharynx: Oropharynx is clear.  Eyes:     General: No scleral icterus.       Right eye: No discharge.        Left eye: No discharge.     Extraocular Movements: Extraocular movements intact.     Conjunctiva/sclera: Conjunctivae normal.     Pupils: Pupils are equal, round, and reactive to light.  Neck:     Musculoskeletal: Normal range of motion and neck supple.  Cardiovascular:     Rate and Rhythm: Normal rate and regular rhythm.     Pulses: Normal pulses.     Heart sounds: Normal heart sounds. No murmur. No friction rub. No gallop.   Pulmonary:     Effort: Pulmonary effort is normal. No respiratory distress.     Breath sounds: Normal breath sounds. No stridor. No wheezing, rhonchi or rales.  Chest:     Chest wall: No  tenderness.  Musculoskeletal: Normal range of motion.  Skin:    General: Skin is warm and dry.     Capillary Refill: Capillary refill takes less than 2 seconds.     Coloration: Skin is not jaundiced or pale.     Findings: No bruising, erythema, lesion or rash.  Neurological:     General: No focal deficit present.     Mental Status: She is alert and oriented to person, place, and time. Mental status is at baseline.  Psychiatric:        Mood and Affect: Mood normal.        Behavior: Behavior normal.        Thought Content: Thought content normal.        Judgment:  Judgment normal.     Results for orders placed or performed in visit on 04/19/18  Microscopic Examination  Result Value Ref Range   WBC, UA 0-5 0 - 5 /hpf   RBC, UA 0-2 0 - 2 /hpf   Epithelial Cells (non renal) 0-10 0 - 10 /hpf   Bacteria, UA Many (A) None seen/Few  Urine Culture, Reflex  Result Value Ref Range   Urine Culture, Routine CANCELED   CBC with Differential/Platelet  Result Value Ref Range   WBC 6.2 3.4 - 10.8 x10E3/uL   RBC 3.87 3.77 - 5.28 x10E6/uL   Hemoglobin 12.1 11.1 - 15.9 g/dL   Hematocrit 35.7 34.0 - 46.6 %   MCV 92 79 - 97 fL   MCH 31.3 26.6 - 33.0 pg   MCHC 33.9 31.5 - 35.7 g/dL   RDW 12.1 11.7 - 15.4 %   Platelets 266 150 - 450 x10E3/uL   Neutrophils 48 Not Estab. %   Lymphs 39 Not Estab. %   Monocytes 5 Not Estab. %   Eos 7 Not Estab. %   Basos 1 Not Estab. %   Neutrophils Absolute 2.9 1.4 - 7.0 x10E3/uL   Lymphocytes Absolute 2.4 0.7 - 3.1 x10E3/uL   Monocytes Absolute 0.3 0.1 - 0.9 x10E3/uL   EOS (ABSOLUTE) 0.5 (H) 0.0 - 0.4 x10E3/uL   Basophils Absolute 0.1 0.0 - 0.2 x10E3/uL   Immature Granulocytes 0 Not Estab. %   Immature Grans (Abs) 0.0 0.0 - 0.1 x10E3/uL  Comprehensive metabolic panel  Result Value Ref Range   Glucose 84 65 - 99 mg/dL   BUN 12 6 - 24 mg/dL   Creatinine, Ser 0.77 0.57 - 1.00 mg/dL   GFR calc non Af Amer 91 >59 mL/min/1.73   GFR calc Af Amer 105 >59 mL/min/1.73   BUN/Creatinine Ratio 16 9 - 23   Sodium 138 134 - 144 mmol/L   Potassium 4.5 3.5 - 5.2 mmol/L   Chloride 102 96 - 106 mmol/L   CO2 20 20 - 29 mmol/L   Calcium 9.4 8.7 - 10.2 mg/dL   Total Protein 6.6 6.0 - 8.5 g/dL   Albumin 4.5 3.8 - 4.8 g/dL   Globulin, Total 2.1 1.5 - 4.5 g/dL   Albumin/Globulin Ratio 2.1 1.2 - 2.2   Bilirubin Total <0.2 0.0 - 1.2 mg/dL   Alkaline Phosphatase 48 39 - 117 IU/L   AST 15 0 - 40 IU/L   ALT 13 0 - 32 IU/L  Lipid Panel w/o Chol/HDL Ratio  Result Value Ref Range   Cholesterol, Total 208 (H) 100 - 199 mg/dL   Triglycerides  169 (H) 0 - 149 mg/dL   HDL 66 >39 mg/dL   VLDL Cholesterol Cal 34 5 -  40 mg/dL   LDL Calculated 108 (H) 0 - 99 mg/dL  TSH  Result Value Ref Range   TSH 1.930 0.450 - 4.500 uIU/mL  UA/M w/rflx Culture, Routine  Result Value Ref Range   Specific Gravity, UA 1.025 1.005 - 1.030   pH, UA 5.0 5.0 - 7.5   Color, UA Yellow Yellow   Appearance Ur Clear Clear   Leukocytes, UA Negative Negative   Protein, UA Negative Negative/Trace   Glucose, UA Negative Negative   Ketones, UA Negative Negative   RBC, UA 2+ (A) Negative   Bilirubin, UA Negative Negative   Urobilinogen, Ur 0.2 0.2 - 1.0 mg/dL   Nitrite, UA Negative Negative   Microscopic Examination See below:    Urinalysis Reflex Comment   B12 and Folate Panel  Result Value Ref Range   Vitamin B-12 893 232 - 1,245 pg/mL   Folate 10.7 >3.0 ng/mL      Assessment & Plan:   Problem List Items Addressed This Visit    None    Visit Diagnoses    Chest pain, unspecified type    -  Primary   EKG shows flipped t-waves, will get her to cardiology. Concern for espohageal spasm. Will start ppi & carafate. Checking labs. Warning signs to go ER discussed.   Relevant Orders   EKG 12-Lead (Completed)   Ambulatory referral to Cardiology   Comprehensive metabolic panel   CBC with Differential/Platelet   TSH   H. pylori antibody, IgA([facility])       Follow up plan: Return in about 2 weeks (around 08/12/2018) for follow up pain.

## 2018-07-30 ENCOUNTER — Other Ambulatory Visit: Payer: Self-pay | Admitting: Family Medicine

## 2018-07-30 DIAGNOSIS — F5101 Primary insomnia: Secondary | ICD-10-CM

## 2018-07-31 LAB — CBC WITH DIFFERENTIAL/PLATELET
Basophils Absolute: 0.1 10*3/uL (ref 0.0–0.2)
Basos: 2 %
EOS (ABSOLUTE): 0.2 10*3/uL (ref 0.0–0.4)
Eos: 2 %
Hematocrit: 35.4 % (ref 34.0–46.6)
Hemoglobin: 12.2 g/dL (ref 11.1–15.9)
Immature Grans (Abs): 0 10*3/uL (ref 0.0–0.1)
Immature Granulocytes: 1 %
Lymphocytes Absolute: 2.3 10*3/uL (ref 0.7–3.1)
Lymphs: 36 %
MCH: 32.6 pg (ref 26.6–33.0)
MCHC: 34.5 g/dL (ref 31.5–35.7)
MCV: 95 fL (ref 79–97)
Monocytes Absolute: 0.5 10*3/uL (ref 0.1–0.9)
Monocytes: 8 %
Neutrophils Absolute: 3.2 10*3/uL (ref 1.4–7.0)
Neutrophils: 51 %
Platelets: 262 10*3/uL (ref 150–450)
RBC: 3.74 x10E6/uL — ABNORMAL LOW (ref 3.77–5.28)
RDW: 12.7 % (ref 11.7–15.4)
WBC: 6.2 10*3/uL (ref 3.4–10.8)

## 2018-07-31 LAB — COMPREHENSIVE METABOLIC PANEL
ALT: 13 IU/L (ref 0–32)
AST: 17 IU/L (ref 0–40)
Albumin/Globulin Ratio: 2.2 (ref 1.2–2.2)
Albumin: 4.6 g/dL (ref 3.8–4.8)
Alkaline Phosphatase: 64 IU/L (ref 39–117)
BUN/Creatinine Ratio: 19 (ref 9–23)
BUN: 13 mg/dL (ref 6–24)
Bilirubin Total: <0.2 mg/dL (ref 0.0–1.2)
CO2: 23 mmol/L (ref 20–29)
Calcium: 9.5 mg/dL (ref 8.7–10.2)
Chloride: 101 mmol/L (ref 96–106)
Creatinine, Ser: 0.69 mg/dL (ref 0.57–1.00)
GFR calc Af Amer: 118 mL/min/{1.73_m2} (ref >59)
GFR calc non Af Amer: 103 mL/min/{1.73_m2} (ref >59)
Globulin, Total: 2.1 g/dL (ref 1.5–4.5)
Glucose: 97 mg/dL (ref 65–99)
Potassium: 4.4 mmol/L (ref 3.5–5.2)
Sodium: 138 mmol/L (ref 134–144)
Total Protein: 6.7 g/dL (ref 6.0–8.5)

## 2018-07-31 LAB — TSH: TSH: 2.32 u[IU]/mL (ref 0.450–4.500)

## 2018-07-31 LAB — H. PYLORI ANTIBODY, IGA: H. pylori, IgA Abs: <9 units (ref 0.0–8.9)

## 2018-08-02 ENCOUNTER — Encounter: Payer: Self-pay | Admitting: Internal Medicine

## 2018-08-02 ENCOUNTER — Ambulatory Visit (INDEPENDENT_AMBULATORY_CARE_PROVIDER_SITE_OTHER): Payer: BC Managed Care – PPO | Admitting: Internal Medicine

## 2018-08-02 ENCOUNTER — Other Ambulatory Visit: Payer: Self-pay

## 2018-08-02 VITALS — BP 116/82 | HR 84 | Resp 16 | Ht 68.0 in | Wt 193.0 lb

## 2018-08-02 DIAGNOSIS — F5101 Primary insomnia: Secondary | ICD-10-CM

## 2018-08-02 DIAGNOSIS — R072 Precordial pain: Secondary | ICD-10-CM | POA: Insufficient documentation

## 2018-08-02 DIAGNOSIS — R0602 Shortness of breath: Secondary | ICD-10-CM

## 2018-08-02 DIAGNOSIS — E782 Mixed hyperlipidemia: Secondary | ICD-10-CM | POA: Insufficient documentation

## 2018-08-02 DIAGNOSIS — G4719 Other hypersomnia: Secondary | ICD-10-CM | POA: Diagnosis not present

## 2018-08-02 MED ORDER — ZOLPIDEM TARTRATE 10 MG PO TABS: 10.0000 mg | ORAL_TABLET | Freq: Every evening | ORAL | 2 refills | Status: DC | PRN

## 2018-08-02 NOTE — Progress Notes (Signed)
[facility] [place], [place] [address]  Pulmonary Sleep Medicine   Office Visit Note  Patient Name: [name] DOB: Nov 02, 1970 MRN [id number]  Date of Service: 08/02/2018  Complaints/HPI: Pt is here for follow up on insomnia and allergies.  Pt reports she is in need of Ambien refill.  She complains of excessive daytime fatigue.  She reports falling asleep while driving.  She also feels the need to take a nap.   ROS  General: (-) fever, (-) chills, (-) night sweats, (-) weakness Skin: (-) rashes, (-) itching,. Eyes: (-) visual changes, (-) redness, (-) itching. Nose and Sinuses: (-) nasal stuffiness or itchiness, (-) postnasal drip, (-) nosebleeds, (-) sinus trouble. Mouth and Throat: (-) sore throat, (-) hoarseness. Neck: (-) swollen glands, (-) enlarged thyroid, (-) neck pain. Respiratory: - cough, (-) bloody sputum, - shortness of breath, - wheezing. Cardiovascular: - ankle swelling, (-) chest pain. Lymphatic: (-) lymph node enlargement. Neurologic: (-) numbness, (-) tingling. Psychiatric: (-) anxiety, (-) depression   Current Medication: Outpatient Encounter Medications as of 08/02/2018  Medication Sig Note  . BLISOVI 24 FE 1-20 MG-MCG(24) tablet Take 1 tablet by mouth daily.   Marland Kitchen buPROPion (WELLBUTRIN XL) 300 MG 24 hr tablet Take 1 tablet (300 mg total) by mouth daily.   . cetirizine (ZYRTEC) 10 MG tablet Take 10 mg by mouth daily.   . cyanocobalamin 2000 MCG tablet Take 2,000 mcg by mouth daily.   Marland Kitchen escitalopram (LEXAPRO) 20 MG tablet Take 1 tablet (20 mg total) by mouth daily.   . fluticasone (FLONASE) 50 MCG/ACT nasal spray instill 2 sprays into each nostril once daily 11/28/2015: Received from: External Pharmacy  . Insulin Syringe-Needle U-100 (INSULIN SYRINGE 1CC/31GX5/16") 31G X 5/16" 1 ML MISC USE FOR ALLERGIES TWO TIMES A WEEK   . montelukast (SINGULAIR) 10 MG tablet Take 1 tablet (10 mg total) by mouth daily.   Marland Kitchen omeprazole (PRILOSEC)  20 MG capsule Take 1 capsule (20 mg total) by mouth daily.   . roflumilast (DALIRESP) 500 MCG TABS tablet Take 1 tablet (500 mcg total) by mouth daily.   . sucralfate (CARAFATE) 1 GM/10ML suspension Take 10 mLs (1 g total) by mouth 4 (four) times daily -  with meals and at bedtime.   . VENTOLIN HFA 108 (90 Base) MCG/ACT inhaler inhale 2 puffs by mouth every 4 to 6 hours   . Vitamin D, Ergocalciferol, (DRISDOL) 50000 units CAPS capsule Take 50,000 Units by mouth once a week. 11/28/2015: Received from: External Pharmacy  . zolpidem (AMBIEN) 10 MG tablet TAKE 1 TABLET(10 MG) BY MOUTH AT BEDTIME AS NEEDED    No facility-administered encounter medications on file as of 08/02/2018.     Surgical History: Past Surgical History:  Procedure Laterality Date  . ABDOMINAL HYSTERECTOMY    . APPENDECTOMY    . OVARIAN CYST DRAINAGE Right Nov. 2003  . TONSILLECTOMY    . TUBAL LIGATION      Medical History: Past Medical History:  Diagnosis Date  . Allergic rhinitis   . Anxiety   . Asthma   . Constipation   . Depression   . Hematuria   . Insomnia   . Reflux     Family History: Family History  Problem Relation Age of Onset  . Cancer Mother        ovarian  . Stroke Father   . Cancer Maternal Uncle        lung  . Diabetes Maternal Grandmother   .  Emphysema Maternal Grandfather   . Diabetes Paternal Grandmother   . Stroke Paternal Grandfather   . Breast cancer Neg Hx     Social History: Social History   Socioeconomic History  . Marital status: Divorced    Spouse name: Not on file  . Number of children: Not on file  . Years of education: Not on file  . Highest education level: Not on file  Occupational History  . Not on file  Social Needs  . Financial resource strain: Not on file  . Food insecurity:    Worry: Not on file    Inability: Not on file  . Transportation needs:    Medical: Not on file    Non-medical: Not on file  Tobacco Use  . Smoking status: Never Smoker  .  Smokeless tobacco: Never Used  Substance and Sexual Activity  . Alcohol use: Yes    Comment: occasional  . Drug use: No  . Sexual activity: Not on file  Lifestyle  . Physical activity:    Days per week: Not on file    Minutes per session: Not on file  . Stress: Not on file  Relationships  . Social connections:    Talks on phone: Not on file    Gets together: Not on file    Attends religious service: Not on file    Active member of club or organization: Not on file    Attends meetings of clubs or organizations: Not on file    Relationship status: Not on file  . Intimate partner violence:    Fear of current or ex partner: Not on file    Emotionally abused: Not on file    Physically abused: Not on file    Forced sexual activity: Not on file  Other Topics Concern  . Not on file  Social History Narrative  . Not on file    Vital Signs: Blood pressure 116/82, pulse 84, resp. rate 16, height 5\' 8"  (1.727 m), weight 193 lb (87.5 kg), SpO2 98 %.  Examination: General Appearance: The patient is well-developed, well-nourished, and in no distress. Skin: Gross inspection of skin unremarkable. Head: normocephalic, no gross deformities. Eyes: no gross deformities noted. ENT: ears appear grossly normal no exudates. Neck: Supple. No thyromegaly. No LAD. Respiratory: clear bilateraly. Cardiovascular: Normal S1 and S2 without murmur or rub. Extremities: No cyanosis. pulses are equal. Neurologic: Alert and oriented. No involuntary movements.  LABS: Recent Results (from the past 2160 hour(s))  Comprehensive metabolic panel     Status: None   Collection Time: 07/29/18  2:21 PM  Result Value Ref Range   Glucose 97 65 - 99 mg/dL   BUN 13 6 - 24 mg/dL   Creatinine, Ser 0.69 0.57 - 1.00 mg/dL   GFR calc non Af Amer 103 >59 mL/min/1.73   GFR calc Af Amer 118 >59 mL/min/1.73   BUN/Creatinine Ratio 19 9 - 23   Sodium 138 134 - 144 mmol/L   Potassium 4.4 3.5 - 5.2 mmol/L   Chloride 101 96  - 106 mmol/L   CO2 23 20 - 29 mmol/L   Calcium 9.5 8.7 - 10.2 mg/dL   Total Protein 6.7 6.0 - 8.5 g/dL   Albumin 4.6 3.8 - 4.8 g/dL   Globulin, Total 2.1 1.5 - 4.5 g/dL   Albumin/Globulin Ratio 2.2 1.2 - 2.2   Bilirubin Total <0.2 0.0 - 1.2 mg/dL   Alkaline Phosphatase 64 39 - 117 IU/L   AST 17 0 - 40  IU/L   ALT 13 0 - 32 IU/L  CBC with Differential/Platelet     Status: Abnormal   Collection Time: 07/29/18  2:21 PM  Result Value Ref Range   WBC 6.2 3.4 - 10.8 x10E3/uL   RBC 3.74 (L) 3.77 - 5.28 x10E6/uL   Hemoglobin 12.2 11.1 - 15.9 g/dL   Hematocrit 35.4 34.0 - 46.6 %   MCV 95 79 - 97 fL   MCH 32.6 26.6 - 33.0 pg   MCHC 34.5 31.5 - 35.7 g/dL   RDW 12.7 11.7 - 15.4 %   Platelets 262 150 - 450 x10E3/uL   Neutrophils 51 Not Estab. %   Lymphs 36 Not Estab. %   Monocytes 8 Not Estab. %   Eos 2 Not Estab. %   Basos 2 Not Estab. %   Neutrophils Absolute 3.2 1.4 - 7.0 x10E3/uL   Lymphocytes Absolute 2.3 0.7 - 3.1 x10E3/uL   Monocytes Absolute 0.5 0.1 - 0.9 x10E3/uL   EOS (ABSOLUTE) 0.2 0.0 - 0.4 x10E3/uL   Basophils Absolute 0.1 0.0 - 0.2 x10E3/uL   Immature Granulocytes 1 Not Estab. %   Immature Grans (Abs) 0.0 0.0 - 0.1 x10E3/uL  TSH     Status: None   Collection Time: 07/29/18  2:21 PM  Result Value Ref Range   TSH 2.320 0.450 - 4.500 uIU/mL  H. pylori antibody, IgA([facility])     Status: None   Collection Time: 07/29/18  2:21 PM  Result Value Ref Range   H. pylori, IgA Abs <9.0 0.0 - 8.9 units    Comment:                                 Negative          <9.0                                 Equivocal   9.0 - 11.0                                 Positive         >11.0     Radiology: Mm 3d Screen Breast Bilateral  Result Date: 03/25/2018 CLINICAL DATA:  Screening. EXAM: DIGITAL SCREENING BILATERAL MAMMOGRAM WITH TOMO AND CAD COMPARISON:  Previous exam(s). ACR Breast Density Category b: There are scattered areas of fibroglandular density. FINDINGS: There are no findings  suspicious for malignancy. Images were processed with CAD. IMPRESSION: No mammographic evidence of malignancy. A result letter of this screening mammogram will be mailed directly to the patient. RECOMMENDATION: Screening mammogram in one year. (Code:SM-B-01Y) BI-RADS CATEGORY  1: Negative. Electronically Signed   By: Lajean Manes M.D.   On: 03/25/2018 15:34    No results found.  No results found.    Assessment and Plan: Patient Active Problem List   Diagnosis Date Noted  . Family history of ovarian cancer 03/12/2016  . Vitamin B12 deficiency (non anemic) 11/18/2014  . Constipation   . Asthma   . Allergic rhinitis   . Depression   . Anxiety   . Hematuria   . Insomnia   . GERD without esophagitis     1. Excessive daytime sleepiness Will get home sleep study to evaluate for OSA - Home sleep test  2. Primary insomnia Reviewed risks and  possible side effects associated with taking opiates, benzodiazepines and other CNS depressants. Combination of these could cause dizziness and drowsiness. Advised patient not to drive or operate machinery when taking these medications, as patient's and other's life can be at risk and will have consequences. Patient verbalized understanding in this matter. Dependence and abuse for these drugs will be monitored closely. A Controlled substance policy and procedure is on file which allows [facility] to order a urine drug screen test at any visit. Patient understands and agrees with the plan - zolpidem (AMBIEN) 10 MG tablet; Take 1 tablet (10 mg total) by mouth at bedtime as needed for sleep.  Dispense: 30 tablet; Refill: 2  3. SOB (shortness of breath) Some improvement in spiro.  FEV1 2.5 which is 77% of pre-predicted value on today's spirometry.    - Spirometry with Graph  General Counseling: I have discussed the findings of the evaluation and examination with Sandhya.  I have also discussed any further diagnostic evaluation thatmay be needed or  ordered today. Adriana verbalizes understanding of the findings of todays visit. We also reviewed her medications today and discussed drug interactions and side effects including but not limited excessive drowsiness and altered mental states. We also discussed that there is always a risk not just to her but also people around her. she has been encouraged to call the office with any questions or concerns that should arise related to todays visit.    Time spent: 20 This patient was seen by Orson Gear AGNP-C in Collaboration with Dr. Devona Konig as a part of collaborative care agreement.   I have personally obtained a history, examined the patient, evaluated laboratory and imaging results, formulated the assessment and plan and placed orders.    Allyne Gee, MD [facility] Pulmonary and Critical Care Sleep medicine

## 2018-08-04 DIAGNOSIS — M9902 Segmental and somatic dysfunction of thoracic region: Secondary | ICD-10-CM | POA: Diagnosis not present

## 2018-08-04 DIAGNOSIS — M9901 Segmental and somatic dysfunction of cervical region: Secondary | ICD-10-CM | POA: Diagnosis not present

## 2018-08-04 DIAGNOSIS — R51 Headache: Secondary | ICD-10-CM | POA: Diagnosis not present

## 2018-08-04 DIAGNOSIS — M5414 Radiculopathy, thoracic region: Secondary | ICD-10-CM | POA: Diagnosis not present

## 2018-08-09 ENCOUNTER — Ambulatory Visit: Payer: BC Managed Care – PPO | Admitting: Internal Medicine

## 2018-08-09 ENCOUNTER — Other Ambulatory Visit: Payer: Self-pay

## 2018-08-09 DIAGNOSIS — R072 Precordial pain: Secondary | ICD-10-CM | POA: Diagnosis not present

## 2018-08-09 DIAGNOSIS — G4733 Obstructive sleep apnea (adult) (pediatric): Secondary | ICD-10-CM

## 2018-08-10 ENCOUNTER — Other Ambulatory Visit: Payer: Self-pay | Admitting: Family Medicine

## 2018-08-12 DIAGNOSIS — R002 Palpitations: Secondary | ICD-10-CM | POA: Insufficient documentation

## 2018-08-12 DIAGNOSIS — R072 Precordial pain: Secondary | ICD-10-CM | POA: Diagnosis not present

## 2018-08-12 DIAGNOSIS — E782 Mixed hyperlipidemia: Secondary | ICD-10-CM | POA: Diagnosis not present

## 2018-08-16 ENCOUNTER — Ambulatory Visit: Payer: BLUE CROSS/BLUE SHIELD | Admitting: Family Medicine

## 2018-08-18 ENCOUNTER — Other Ambulatory Visit: Payer: BC Managed Care – PPO | Admitting: Internal Medicine

## 2018-08-22 NOTE — Procedures (Signed)
[facility] [place], [place] [address]  Sleep Specialist: Allyne Gee, MD [place] Sleep Study Interpretation  Patient Name: [name] Patient MR [id number] DOB:May 05, 1970  Date of Study: August 10, 2018  Indications for study: Hypersomnia excessive sleepiness  BMI: 29.3 kg/m       Respiratory Data:  Total AHI: 11.7/h  Total Obstructive Apneas: 2  Total Central Apneas: 5  Total Mixed Apneas: 0  Total Hypopneas: 83  If the AHI is greater than 5 per hour patient qualifies for PAP evaluation  Oximetry Data:  Oxygen Desaturation Index: 13.2/h  Lowest Desaturation: 85%  Cardiac Data:  Minimum Heart Rate: 85  Maximum Heart Rate: 98   Impression / Diagnosis:  Apnea study is consistent with mild obstructive sleep apnea with an AHI of 11.7/h.  Patient does have significant oxygen desaturations down to 85%.  CPAP titration is recommended in the appropriate clinical setting.  GENERAL Recommendations:  1.  Consider Auto PAP with pressure ranges 5-20 cmH20 with download, or facility based PAP Titration Study  2.  Consider PAP interface mask fitted for patient comfort, Heated Humidification & PAP compliance monitoring (1 month, 3 months & 12 months after PAP initiation)  3. Consider treatment with mandibular advancement splint (MAS) or referral to an ENT surgeon for modification to the upper airway if the patient prefers an alternate therapy or the PAP trial is unsuccessful  4. Sleep hygiene measures should be discussed with the patient  5. Behavioral therapy such as weight reduction or smoking cessation as appropriate for the patient  6. Advise patient against the use of alcohol or sedatives in so much as these substances can worsen excessive daytime sleepiness and respiratory disturbances of sleep  7. Advise patient against participating in potentially dangerous activities while drowsy such as operating a motor vehicle,  heavy equipment or power tools as it can put them and others in danger  8. Advise patient of the long term consequences of OSA if left untreated, need for treatment and close follow up  9. Clinical follow up as deemed necessary     This Level III home sleep study was performed using the [employer], a 4 channel screening device subject to limitations. Depending on actual total sleep time, not measured in this study, the AHI (sum of apneas and hypopneas/hr of sleep) and therefore the severity of sleep apnea may be underestimated. As with any single night study, including Level 1 attended PSG, severity of sleep apnea may also be underestimated due to the lack of supine and/or REM sleep.  The interpretation associated with this report is based on normal values and degrees of severity in accordance with AASM parameters and/or estimated from multiple sources in the literature for adults ages 64-80+. These may not agree with the displayed values. The patient's treating physician should use the interpretation and recommendations in conjunction with the overall clinical evaluation and treatment of the patient.  Some of the terminology used in this scored [facility] report was developed several years ago and may not always be in accordance with current nomenclature. This in no way affects the accuracy of the data or the reliability of the interpretation and recommendations.

## 2018-08-23 ENCOUNTER — Ambulatory Visit: Payer: BC Managed Care – PPO | Admitting: Internal Medicine

## 2018-08-23 ENCOUNTER — Other Ambulatory Visit: Payer: Self-pay

## 2018-08-23 ENCOUNTER — Encounter: Payer: Self-pay | Admitting: Internal Medicine

## 2018-08-23 VITALS — BP 134/76 | HR 73 | Resp 16 | Ht 68.0 in | Wt 196.8 lb

## 2018-08-23 DIAGNOSIS — J4520 Mild intermittent asthma, uncomplicated: Secondary | ICD-10-CM | POA: Diagnosis not present

## 2018-08-23 DIAGNOSIS — J301 Allergic rhinitis due to pollen: Secondary | ICD-10-CM

## 2018-08-23 DIAGNOSIS — K219 Gastro-esophageal reflux disease without esophagitis: Secondary | ICD-10-CM

## 2018-08-23 DIAGNOSIS — G4733 Obstructive sleep apnea (adult) (pediatric): Secondary | ICD-10-CM

## 2018-08-23 NOTE — Patient Instructions (Signed)

## 2018-08-23 NOTE — Progress Notes (Signed)
[facility] [place], [place] [address]  Pulmonary Sleep Medicine   Office Visit Note  Patient Name: [name] DOB: [birth year] MRN [id number]  Date of Service: 08/23/2018  Complaints/HPI: She is doing well from an asthma perspective.  She has been having excessive daytime somnolence hypersomnia and so therefore she had a sleep study done.  Sleep study reveals that she has mild obstructive sleep apnea.  Spoke to her regarding the sleep study findings and discussed the treatment options with her.  Because of her underlying pulmonary issues I do not want to bring her into the lab due to her high risk status and so therefore what we will do is do a home auto titration with an auto titrating CPAP device  ROS  General: (-) fever, (-) chills, (-) night sweats, (-) weakness Skin: (-) rashes, (-) itching,. Eyes: (-) visual changes, (-) redness, (-) itching. Nose and Sinuses: (-) nasal stuffiness or itchiness, (-) postnasal drip, (-) nosebleeds, (-) sinus trouble. Mouth and Throat: (-) sore throat, (-) hoarseness. Neck: (-) swollen glands, (-) enlarged thyroid, (-) neck pain. Respiratory: - cough, (-) bloody sputum, - shortness of breath, - wheezing. Cardiovascular: - ankle swelling, (-) chest pain. Lymphatic: (-) lymph node enlargement. Neurologic: (-) numbness, (-) tingling. Psychiatric: (-) anxiety, (-) depression   Current Medication: Outpatient Encounter Medications as of 08/23/2018  Medication Sig Note  . BLISOVI 24 FE 1-20 MG-MCG(24) tablet Take 1 tablet by mouth daily.   Marland Kitchen buPROPion (WELLBUTRIN XL) 300 MG 24 hr tablet Take 1 tablet (300 mg total) by mouth daily.   . cetirizine (ZYRTEC) 10 MG tablet Take 10 mg by mouth daily.   . cyanocobalamin 2000 MCG tablet Take 2,000 mcg by mouth daily.   Marland Kitchen escitalopram (LEXAPRO) 20 MG tablet Take 1 tablet (20 mg total) by mouth daily.   . fluticasone (FLONASE) 50 MCG/ACT nasal spray instill 2 sprays into each  nostril once daily 11/28/2015: Received from: External Pharmacy  . Insulin Syringe-Needle U-100 (INSULIN SYRINGE 1CC/31GX5/16") 31G X 5/16" 1 ML MISC USE FOR ALLERGIES TWO TIMES A WEEK   . montelukast (SINGULAIR) 10 MG tablet Take 1 tablet (10 mg total) by mouth daily.   Marland Kitchen omeprazole (PRILOSEC) 20 MG capsule Take 1 capsule (20 mg total) by mouth daily.   . roflumilast (DALIRESP) 500 MCG TABS tablet Take 1 tablet (500 mcg total) by mouth daily.   . sucralfate (CARAFATE) 1 GM/10ML suspension Take 10 mLs (1 g total) by mouth 4 (four) times daily -  with meals and at bedtime.   . VENTOLIN HFA 108 (90 Base) MCG/ACT inhaler inhale 2 puffs by mouth every 4 to 6 hours   . Vitamin D, Ergocalciferol, (DRISDOL) 50000 units CAPS capsule Take 50,000 Units by mouth once a week. 11/28/2015: Received from: External Pharmacy  . zolpidem (AMBIEN) 10 MG tablet Take 1 tablet (10 mg total) by mouth at bedtime as needed for sleep.    No facility-administered encounter medications on file as of 08/23/2018.     Surgical History: Past Surgical History:  Procedure Laterality Date  . ABDOMINAL HYSTERECTOMY    . APPENDECTOMY    . OVARIAN CYST DRAINAGE Right Nov. 2003  . TONSILLECTOMY    . TUBAL LIGATION      Medical History: Past Medical History:  Diagnosis Date  . Allergic rhinitis   . Anxiety   . Asthma   . Constipation   . Depression   . Hematuria   . Insomnia   .  Reflux     Family History: Family History  Problem Relation Age of Onset  . Cancer Mother        ovarian  . Stroke Father   . Cancer Maternal Uncle        lung  . Diabetes Maternal Grandmother   . Emphysema Maternal Grandfather   . Diabetes Paternal Grandmother   . Stroke Paternal Grandfather   . Breast cancer Neg Hx     Social History: Social History   Socioeconomic History  . Marital status: Divorced    Spouse name: Not on file  . Number of children: Not on file  . Years of education: Not on file  . Highest education level:  Not on file  Occupational History  . Not on file  Social Needs  . Financial resource strain: Not on file  . Food insecurity    Worry: Not on file    Inability: Not on file  . Transportation needs    Medical: Not on file    Non-medical: Not on file  Tobacco Use  . Smoking status: Never Smoker  . Smokeless tobacco: Never Used  Substance and Sexual Activity  . Alcohol use: Yes    Comment: occasional  . Drug use: No  . Sexual activity: Not on file  Lifestyle  . Physical activity    Days per week: Not on file    Minutes per session: Not on file  . Stress: Not on file  Relationships  . Social Herbalist on phone: Not on file    Gets together: Not on file    Attends religious service: Not on file    Active member of club or organization: Not on file    Attends meetings of clubs or organizations: Not on file    Relationship status: Not on file  . Intimate partner violence    Fear of current or ex partner: Not on file    Emotionally abused: Not on file    Physically abused: Not on file    Forced sexual activity: Not on file  Other Topics Concern  . Not on file  Social History Narrative  . Not on file    Vital Signs: Blood pressure 134/76, pulse 73, resp. rate 16, height 5\' 8"  (1.727 m), weight 196 lb 12.8 oz (89.3 kg), SpO2 98 %.  Examination: General Appearance: The patient is well-developed, well-nourished, and in no distress. Skin: Gross inspection of skin unremarkable. Head: normocephalic, no gross deformities. Eyes: no gross deformities noted. ENT: ears appear grossly normal no exudates. Neck: Supple. No thyromegaly. No LAD. Respiratory: No rhonchi or rales are noted at this time. Cardiovascular: Normal S1 and S2 without murmur or rub. Extremities: No cyanosis. pulses are equal. Neurologic: Alert and oriented. No involuntary movements.  LABS: Recent Results (from the past 2160 hour(s))  Comprehensive metabolic panel     Status: None   Collection  Time: 07/29/18  2:21 PM  Result Value Ref Range   Glucose 97 65 - 99 mg/dL   BUN 13 6 - 24 mg/dL   Creatinine, Ser 0.69 0.57 - 1.00 mg/dL   GFR calc non Af Amer 103 >59 mL/min/1.73   GFR calc Af Amer 118 >59 mL/min/1.73   BUN/Creatinine Ratio 19 9 - 23   Sodium 138 134 - 144 mmol/L   Potassium 4.4 3.5 - 5.2 mmol/L   Chloride 101 96 - 106 mmol/L   CO2 23 20 - 29 mmol/L   Calcium 9.5 8.7 -  10.2 mg/dL   Total Protein 6.7 6.0 - 8.5 g/dL   Albumin 4.6 3.8 - 4.8 g/dL   Globulin, Total 2.1 1.5 - 4.5 g/dL   Albumin/Globulin Ratio 2.2 1.2 - 2.2   Bilirubin Total <0.2 0.0 - 1.2 mg/dL   Alkaline Phosphatase 64 39 - 117 IU/L   AST 17 0 - 40 IU/L   ALT 13 0 - 32 IU/L  CBC with Differential/Platelet     Status: Abnormal   Collection Time: 07/29/18  2:21 PM  Result Value Ref Range   WBC 6.2 3.4 - 10.8 x10E3/uL   RBC 3.74 (L) 3.77 - 5.28 x10E6/uL   Hemoglobin 12.2 11.1 - 15.9 g/dL   Hematocrit 35.4 34.0 - 46.6 %   MCV 95 79 - 97 fL   MCH 32.6 26.6 - 33.0 pg   MCHC 34.5 31.5 - 35.7 g/dL   RDW 12.7 11.7 - 15.4 %   Platelets 262 150 - 450 x10E3/uL   Neutrophils 51 Not Estab. %   Lymphs 36 Not Estab. %   Monocytes 8 Not Estab. %   Eos 2 Not Estab. %   Basos 2 Not Estab. %   Neutrophils Absolute 3.2 1.4 - 7.0 x10E3/uL   Lymphocytes Absolute 2.3 0.7 - 3.1 x10E3/uL   Monocytes Absolute 0.5 0.1 - 0.9 x10E3/uL   EOS (ABSOLUTE) 0.2 0.0 - 0.4 x10E3/uL   Basophils Absolute 0.1 0.0 - 0.2 x10E3/uL   Immature Granulocytes 1 Not Estab. %   Immature Grans (Abs) 0.0 0.0 - 0.1 x10E3/uL  TSH     Status: None   Collection Time: 07/29/18  2:21 PM  Result Value Ref Range   TSH 2.320 0.450 - 4.500 uIU/mL  H. pylori antibody, IgA([facility])     Status: None   Collection Time: 07/29/18  2:21 PM  Result Value Ref Range   H. pylori, IgA Abs <9.0 0.0 - 8.9 units    Comment:                                 Negative          <9.0                                 Equivocal   9.0 - 11.0                                  Positive         >11.0     Radiology: Mm 3d Screen Breast Bilateral  Result Date: 03/25/2018 CLINICAL DATA:  Screening. EXAM: DIGITAL SCREENING BILATERAL MAMMOGRAM WITH TOMO AND CAD COMPARISON:  Previous exam(s). ACR Breast Density Category b: There are scattered areas of fibroglandular density. FINDINGS: There are no findings suspicious for malignancy. Images were processed with CAD. IMPRESSION: No mammographic evidence of malignancy. A result letter of this screening mammogram will be mailed directly to the patient. RECOMMENDATION: Screening mammogram in one year. (Code:SM-B-01Y) BI-RADS CATEGORY  1: Negative. Electronically Signed   By: Lajean Manes M.D.   On: 03/25/2018 15:34    No results found.  No results found.    Assessment and Plan: Patient Active Problem List   Diagnosis Date Noted  . Family history of ovarian cancer 03/12/2016  . Vitamin B12 deficiency (non anemic) 11/18/2014  .  Constipation   . Asthma   . Allergic rhinitis   . Depression   . Anxiety   . Hematuria   . Insomnia   . GERD without esophagitis     1. Obstructive sleep apnea she is going to get set up for a auto titrating CPAP device this is been explained to her she understands we will continue with other supportive care in the meantime. 2. Chronic obstructive asthma this is under control we will continue with supportive care. 3. Allergic rhinitis she has been treated with allergy shots this will be continued 4. GERD at baseline she is under control  General Counseling: I have discussed the findings of the evaluation and examination with Nixon.  I have also discussed any further diagnostic evaluation thatmay be needed or ordered today. Darnell verbalizes understanding of the findings of todays visit. We also reviewed her medications today and discussed drug interactions and side effects including but not limited excessive drowsiness and altered mental states. We also discussed that there is always a risk  not just to her but also people around her. she has been encouraged to call the office with any questions or concerns that should arise related to todays visit.    Time spent: 15 minutes  I have personally obtained a history, examined the patient, evaluated laboratory and imaging results, formulated the assessment and plan and placed orders.    Allyne Gee, MD [facility] Pulmonary and Critical Care Sleep medicine

## 2018-08-24 ENCOUNTER — Telehealth: Payer: Self-pay

## 2018-08-24 ENCOUNTER — Ambulatory Visit: Payer: Self-pay | Admitting: Family Medicine

## 2018-08-24 NOTE — Telephone Encounter (Signed)
Gave [facility] new cpap set up RX and put copy in scan. Beth

## 2018-09-02 ENCOUNTER — Encounter: Payer: Self-pay | Admitting: Family Medicine

## 2018-09-02 ENCOUNTER — Other Ambulatory Visit: Payer: Self-pay

## 2018-09-02 ENCOUNTER — Ambulatory Visit (INDEPENDENT_AMBULATORY_CARE_PROVIDER_SITE_OTHER): Payer: BC Managed Care – PPO | Admitting: Family Medicine

## 2018-09-02 DIAGNOSIS — K580 Irritable bowel syndrome with diarrhea: Secondary | ICD-10-CM | POA: Diagnosis not present

## 2018-09-02 DIAGNOSIS — R079 Chest pain, unspecified: Secondary | ICD-10-CM | POA: Diagnosis not present

## 2018-09-02 MED ORDER — SUCRALFATE 1 GM/10ML PO SUSP: 1.0000 g | Freq: Three times a day (TID) | ORAL | 4 refills | Status: DC

## 2018-09-02 NOTE — Progress Notes (Signed)
There were no vitals taken for this visit.   Subjective:    Patient ID: [name], female    DOB: 05/08/1970, 48 y.o.   MRN: [id number]  HPI: [name] is a 48 y.o. female  Chief Complaint  Patient presents with  . Follow-up    Chest pain. Pain has subsided, Per [facility] by Cardiology, "normal stress test without evidence of myocardial ischemia"   Feeling much much better. Has not had any pain in her chest any more. Notes that the carafate really helped. She saw cardiology and had a clean stress echo. That is making her feel a lot better.   Having some issues with IBS-D. Was doing a lot better when she was taking the carafate. Wonders if she can take it to help the IBS. She is otherwise feeling well with no other concerns or complaints at this time.   Relevant past medical, surgical, family and social history reviewed and updated as indicated. Interim medical history since our last visit reviewed. Allergies and medications reviewed and updated.  Review of Systems  Constitutional: Negative.   Respiratory: Negative.   Cardiovascular: Negative.   Gastrointestinal: Positive for diarrhea. Negative for abdominal distention, abdominal pain, anal bleeding, blood in stool, constipation, nausea, rectal pain and vomiting.  Psychiatric/Behavioral: Negative.     Per HPI unless specifically indicated above     Objective:    There were no vitals taken for this visit.  Wt Readings from Last 3 Encounters:  08/23/18 196 lb 12.8 oz (89.3 kg)  08/02/18 193 lb (87.5 kg)  07/20/18 186 lb (84.4 kg)    Physical Exam Vitals signs and nursing note reviewed.  Constitutional:      General: She is not in acute distress.    Appearance: Normal appearance. She is not ill-appearing, toxic-appearing or diaphoretic.  HENT:     Head: Normocephalic and atraumatic.     Right Ear: External ear normal.     Left Ear: External ear normal.     Nose: Nose normal.     Mouth/Throat:     Mouth:  Mucous membranes are moist.     Pharynx: Oropharynx is clear.  Eyes:     General: No scleral icterus.       Right eye: No discharge.        Left eye: No discharge.     Conjunctiva/sclera: Conjunctivae normal.     Pupils: Pupils are equal, round, and reactive to light.  Neck:     Musculoskeletal: Normal range of motion.  Pulmonary:     Effort: Pulmonary effort is normal. No respiratory distress.     Comments: Speaking in full sentences Musculoskeletal: Normal range of motion.  Skin:    Coloration: Skin is not jaundiced or pale.     Findings: No bruising, erythema, lesion or rash.  Neurological:     Mental Status: She is alert and oriented to person, place, and time. Mental status is at baseline.  Psychiatric:        Mood and Affect: Mood normal.        Behavior: Behavior normal.        Thought Content: Thought content normal.        Judgment: Judgment normal.     Results for orders placed or performed in visit on 07/29/18  Comprehensive metabolic panel  Result Value Ref Range   Glucose 97 65 - 99 mg/dL   BUN 13 6 - 24 mg/dL   Creatinine, Ser 0.69 0.57 -  1.00 mg/dL   GFR calc non Af Amer 103 >59 mL/min/1.73   GFR calc Af Amer 118 >59 mL/min/1.73   BUN/Creatinine Ratio 19 9 - 23   Sodium 138 134 - 144 mmol/L   Potassium 4.4 3.5 - 5.2 mmol/L   Chloride 101 96 - 106 mmol/L   CO2 23 20 - 29 mmol/L   Calcium 9.5 8.7 - 10.2 mg/dL   Total Protein 6.7 6.0 - 8.5 g/dL   Albumin 4.6 3.8 - 4.8 g/dL   Globulin, Total 2.1 1.5 - 4.5 g/dL   Albumin/Globulin Ratio 2.2 1.2 - 2.2   Bilirubin Total <0.2 0.0 - 1.2 mg/dL   Alkaline Phosphatase 64 39 - 117 IU/L   AST 17 0 - 40 IU/L   ALT 13 0 - 32 IU/L  CBC with Differential/Platelet  Result Value Ref Range   WBC 6.2 3.4 - 10.8 x10E3/uL   RBC 3.74 (L) 3.77 - 5.28 x10E6/uL   Hemoglobin 12.2 11.1 - 15.9 g/dL   Hematocrit 35.4 34.0 - 46.6 %   MCV 95 79 - 97 fL   MCH 32.6 26.6 - 33.0 pg   MCHC 34.5 31.5 - 35.7 g/dL   RDW 12.7 11.7 - 15.4  %   Platelets 262 150 - 450 x10E3/uL   Neutrophils 51 Not Estab. %   Lymphs 36 Not Estab. %   Monocytes 8 Not Estab. %   Eos 2 Not Estab. %   Basos 2 Not Estab. %   Neutrophils Absolute 3.2 1.4 - 7.0 x10E3/uL   Lymphocytes Absolute 2.3 0.7 - 3.1 x10E3/uL   Monocytes Absolute 0.5 0.1 - 0.9 x10E3/uL   EOS (ABSOLUTE) 0.2 0.0 - 0.4 x10E3/uL   Basophils Absolute 0.1 0.0 - 0.2 x10E3/uL   Immature Granulocytes 1 Not Estab. %   Immature Grans (Abs) 0.0 0.0 - 0.1 x10E3/uL  TSH  Result Value Ref Range   TSH 2.320 0.450 - 4.500 uIU/mL  H. pylori antibody, IgA([facility])  Result Value Ref Range   H. pylori, IgA Abs <9.0 0.0 - 8.9 units      Assessment & Plan:   Problem List Items Addressed This Visit      Digestive   Irritable bowel syndrome with diarrhea    Discussed treatment options including xifaxin. If doing well with carafate, OK to continue on a PRN basis. Refill given. Call with any concerns. Continue to monitor.       Relevant Medications   sucralfate (CARAFATE) 1 GM/10ML suspension    Other Visit Diagnoses    Chest pain, unspecified type    -  Primary   Resolved. Clean bill from cardiology. Call with any concerns. Continue to monitor.        Follow up plan: Return if symptoms worsen or fail to improve.   . This visit was completed via Doximity due to the restrictions of the COVID-19 pandemic. All issues as above were discussed and addressed. Physical exam was done as above through visual confirmation on Doximity. If it was felt that the patient should be evaluated in the office, they were directed there. The patient verbally consented to this visit. . Location of the patient: home . Location of the provider: work . Those involved with this call:  . Provider: Park Liter, DO . CMA: Gerda Diss, CMA . Front Desk/Registration: Don Perking  . Time spent on call: 15 minutes with patient face to face via video conference. More than 50% of this time was spent in  counseling and  coordination of care. 23 minutes total spent in review of patient's record and preparation of their chart.

## 2018-09-04 ENCOUNTER — Encounter: Payer: Self-pay | Admitting: Family Medicine

## 2018-09-04 DIAGNOSIS — K580 Irritable bowel syndrome with diarrhea: Secondary | ICD-10-CM | POA: Insufficient documentation

## 2018-09-04 NOTE — Assessment & Plan Note (Signed)
Discussed treatment options including xifaxin. If doing well with carafate, OK to continue on a PRN basis. Refill given. Call with any concerns. Continue to monitor.

## 2018-09-08 ENCOUNTER — Ambulatory Visit: Payer: BC Managed Care – PPO

## 2018-10-13 ENCOUNTER — Other Ambulatory Visit: Payer: Self-pay

## 2018-10-13 ENCOUNTER — Ambulatory Visit (INDEPENDENT_AMBULATORY_CARE_PROVIDER_SITE_OTHER): Payer: BC Managed Care – PPO

## 2018-10-13 DIAGNOSIS — G4733 Obstructive sleep apnea (adult) (pediatric): Secondary | ICD-10-CM

## 2018-10-13 DIAGNOSIS — F411 Generalized anxiety disorder: Secondary | ICD-10-CM | POA: Diagnosis not present

## 2018-10-13 DIAGNOSIS — F331 Major depressive disorder, recurrent, moderate: Secondary | ICD-10-CM | POA: Diagnosis not present

## 2018-10-13 NOTE — Progress Notes (Signed)
Pt seen today pap. Pt s/u with S10 Autoset at a pressure of 5-15. Pt fitted with F30 FFM in small. Pt oriented to machine and insurance requirements.

## 2018-10-15 ENCOUNTER — Other Ambulatory Visit: Payer: Self-pay | Admitting: Family Medicine

## 2018-10-19 ENCOUNTER — Other Ambulatory Visit: Payer: Self-pay | Admitting: Internal Medicine

## 2018-10-20 ENCOUNTER — Other Ambulatory Visit: Payer: Self-pay | Admitting: Internal Medicine

## 2018-10-20 MED ORDER — DALIRESP 500 MCG PO TABS: 500.0000 ug | ORAL_TABLET | Freq: Every day | ORAL | 1 refills | Status: DC

## 2018-11-03 ENCOUNTER — Other Ambulatory Visit: Payer: Self-pay | Admitting: Adult Health

## 2018-11-03 DIAGNOSIS — F5101 Primary insomnia: Secondary | ICD-10-CM

## 2018-11-03 NOTE — Telephone Encounter (Signed)
Last appt was 08/23/18 and next is 11/16/18.

## 2018-11-10 ENCOUNTER — Ambulatory Visit: Payer: BC Managed Care – PPO

## 2018-11-13 DIAGNOSIS — G4733 Obstructive sleep apnea (adult) (pediatric): Secondary | ICD-10-CM | POA: Diagnosis not present

## 2018-11-13 DIAGNOSIS — F411 Generalized anxiety disorder: Secondary | ICD-10-CM | POA: Diagnosis not present

## 2018-11-13 DIAGNOSIS — F331 Major depressive disorder, recurrent, moderate: Secondary | ICD-10-CM | POA: Diagnosis not present

## 2018-11-16 ENCOUNTER — Ambulatory Visit: Payer: BC Managed Care – PPO | Admitting: Internal Medicine

## 2018-11-17 DIAGNOSIS — Z85828 Personal history of other malignant neoplasm of skin: Secondary | ICD-10-CM | POA: Diagnosis not present

## 2018-11-17 DIAGNOSIS — L578 Other skin changes due to chronic exposure to nonionizing radiation: Secondary | ICD-10-CM | POA: Diagnosis not present

## 2018-11-17 DIAGNOSIS — L814 Other melanin hyperpigmentation: Secondary | ICD-10-CM | POA: Diagnosis not present

## 2018-11-17 DIAGNOSIS — L821 Other seborrheic keratosis: Secondary | ICD-10-CM | POA: Diagnosis not present

## 2018-11-22 ENCOUNTER — Other Ambulatory Visit: Payer: Self-pay | Admitting: Family Medicine

## 2018-11-22 ENCOUNTER — Telehealth: Payer: Self-pay

## 2018-11-22 NOTE — Telephone Encounter (Signed)
ORDER SIGNED AND PLACED IN [facility] FOLDER. 

## 2018-11-26 DIAGNOSIS — J301 Allergic rhinitis due to pollen: Secondary | ICD-10-CM | POA: Diagnosis not present

## 2018-11-30 ENCOUNTER — Ambulatory Visit (INDEPENDENT_AMBULATORY_CARE_PROVIDER_SITE_OTHER): Payer: BC Managed Care – PPO

## 2018-11-30 ENCOUNTER — Other Ambulatory Visit: Payer: Self-pay

## 2018-11-30 ENCOUNTER — Telehealth: Payer: Self-pay

## 2018-11-30 DIAGNOSIS — J301 Allergic rhinitis due to pollen: Secondary | ICD-10-CM | POA: Diagnosis not present

## 2018-11-30 NOTE — Telephone Encounter (Signed)
CALLED PT TO INFORM HER THAT HER ALLERGY INJECTION IS READY AND PT WILL NEED AN APPT.

## 2018-12-13 DIAGNOSIS — F411 Generalized anxiety disorder: Secondary | ICD-10-CM | POA: Diagnosis not present

## 2018-12-13 DIAGNOSIS — F331 Major depressive disorder, recurrent, moderate: Secondary | ICD-10-CM | POA: Diagnosis not present

## 2018-12-13 DIAGNOSIS — G4733 Obstructive sleep apnea (adult) (pediatric): Secondary | ICD-10-CM | POA: Diagnosis not present

## 2018-12-14 ENCOUNTER — Encounter: Payer: Self-pay | Admitting: Internal Medicine

## 2018-12-14 ENCOUNTER — Ambulatory Visit: Payer: BC Managed Care – PPO | Admitting: Internal Medicine

## 2018-12-14 ENCOUNTER — Other Ambulatory Visit: Payer: Self-pay

## 2018-12-14 VITALS — BP 134/88 | HR 72 | Temp 97.3°F | Resp 16 | Ht 68.0 in | Wt 200.0 lb

## 2018-12-14 DIAGNOSIS — K219 Gastro-esophageal reflux disease without esophagitis: Secondary | ICD-10-CM

## 2018-12-14 DIAGNOSIS — J301 Allergic rhinitis due to pollen: Secondary | ICD-10-CM | POA: Diagnosis not present

## 2018-12-14 DIAGNOSIS — G4733 Obstructive sleep apnea (adult) (pediatric): Secondary | ICD-10-CM

## 2018-12-14 DIAGNOSIS — Z9989 Dependence on other enabling machines and devices: Secondary | ICD-10-CM

## 2018-12-14 DIAGNOSIS — J4520 Mild intermittent asthma, uncomplicated: Secondary | ICD-10-CM

## 2018-12-14 DIAGNOSIS — F5101 Primary insomnia: Secondary | ICD-10-CM

## 2018-12-14 MED ORDER — ZOLPIDEM TARTRATE 10 MG PO TABS: 10.0000 mg | ORAL_TABLET | Freq: Every evening | ORAL | 1 refills | Status: DC | PRN

## 2018-12-14 NOTE — Progress Notes (Signed)
[facility] [place], [place] [address]  Pulmonary Sleep Medicine   Office Visit Note  Patient Name: [name] DOB: Apr 19, 1970 MRN [id number]  Date of Service: 12/14/2018  Complaints/HPI: Pt is here for pulmonary follow up.  She is using her cpap machine nightly.  Pt reports good compliance with CPAP therapy. Cleaning machine by hand, and changing filters and tubing as directed. Denies headaches, sinus issues, palpitations, or hemoptysis.    ROS  General: (-) fever, (-) chills, (-) night sweats, (-) weakness Skin: (-) rashes, (-) itching,. Eyes: (-) visual changes, (-) redness, (-) itching. Nose and Sinuses: (-) nasal stuffiness or itchiness, (-) postnasal drip, (-) nosebleeds, (-) sinus trouble. Mouth and Throat: (-) sore throat, (-) hoarseness. Neck: (-) swollen glands, (-) enlarged thyroid, (-) neck pain. Respiratory: - cough, (-) bloody sputum, - shortness of breath, - wheezing. Cardiovascular: - ankle swelling, (-) chest pain. Lymphatic: (-) lymph node enlargement. Neurologic: (-) numbness, (-) tingling. Psychiatric: (-) anxiety, (-) depression   Current Medication: Outpatient Encounter Medications as of 12/14/2018  Medication Sig Note  . BLISOVI 24 FE 1-20 MG-MCG(24) tablet Take 1 tablet by mouth daily.   Marland Kitchen buPROPion (WELLBUTRIN XL) 300 MG 24 hr tablet TAKE 1 TABLET(300 MG) BY MOUTH DAILY   . cetirizine (ZYRTEC) 10 MG tablet Take 10 mg by mouth daily.   . cyanocobalamin 2000 MCG tablet Take 2,000 mcg by mouth daily.   Marland Kitchen escitalopram (LEXAPRO) 20 MG tablet Take 1 tablet (20 mg total) by mouth daily.   . fluticasone (FLONASE) 50 MCG/ACT nasal spray instill 2 sprays into each nostril once daily 11/28/2015: Received from: External Pharmacy  . Insulin Syringe-Needle U-100 (INSULIN SYRINGE 1CC/31GX5/16") 31G X 5/16" 1 ML MISC USE FOR ALLERGIES TWO TIMES A WEEK   . montelukast (SINGULAIR) 10 MG tablet Take 1 tablet (10 mg total) by mouth daily.    Marland Kitchen omeprazole (PRILOSEC) 20 MG capsule TAKE 1 CAPSULE(20 MG) BY MOUTH DAILY   . roflumilast (DALIRESP) 500 MCG TABS tablet Take 1 tablet (500 mcg total) by mouth daily.   . sucralfate (CARAFATE) 1 GM/10ML suspension Take 10 mLs (1 g total) by mouth 4 (four) times daily -  with meals and at bedtime.   . VENTOLIN HFA 108 (90 Base) MCG/ACT inhaler inhale 2 puffs by mouth every 4 to 6 hours   . Vitamin D, Ergocalciferol, (DRISDOL) 50000 units CAPS capsule Take 50,000 Units by mouth once a week. 11/28/2015: Received from: External Pharmacy  . zolpidem (AMBIEN) 10 MG tablet TAKE 1 TABLET(10 MG) BY MOUTH AT BEDTIME AS NEEDED    No facility-administered encounter medications on file as of 12/14/2018.     Surgical History: Past Surgical History:  Procedure Laterality Date  . ABDOMINAL HYSTERECTOMY    . APPENDECTOMY    . OVARIAN CYST DRAINAGE Right Nov. 2003  . TONSILLECTOMY    . TUBAL LIGATION      Medical History: Past Medical History:  Diagnosis Date  . Allergic rhinitis   . Anxiety   . Asthma   . Constipation   . Depression   . Hematuria   . Insomnia   . Reflux     Family History: Family History  Problem Relation Age of Onset  . Cancer Mother        ovarian  . Stroke Father   . Cancer Maternal Uncle        lung  . Diabetes Maternal Grandmother   . Emphysema Maternal Grandfather   .  Diabetes Paternal Grandmother   . Stroke Paternal Grandfather   . Breast cancer Neg Hx     Social History: Social History   Socioeconomic History  . Marital status: Divorced    Spouse name: Not on file  . Number of children: Not on file  . Years of education: Not on file  . Highest education level: Not on file  Occupational History  . Not on file  Social Needs  . Financial resource strain: Not on file  . Food insecurity    Worry: Not on file    Inability: Not on file  . Transportation needs    Medical: Not on file    Non-medical: Not on file  Tobacco Use  . Smoking status:  Never Smoker  . Smokeless tobacco: Never Used  Substance and Sexual Activity  . Alcohol use: Yes    Comment: occasional  . Drug use: No  . Sexual activity: Not on file  Lifestyle  . Physical activity    Days per week: Not on file    Minutes per session: Not on file  . Stress: Not on file  Relationships  . Social Herbalist on phone: Not on file    Gets together: Not on file    Attends religious service: Not on file    Active member of club or organization: Not on file    Attends meetings of clubs or organizations: Not on file    Relationship status: Not on file  . Intimate partner violence    Fear of current or ex partner: Not on file    Emotionally abused: Not on file    Physically abused: Not on file    Forced sexual activity: Not on file  Other Topics Concern  . Not on file  Social History Narrative  . Not on file    Vital Signs: Blood pressure 134/88, pulse 72, temperature (!) 97.3 F (36.3 C), resp. rate 16, height 5\' 8"  (1.727 m), weight 200 lb (90.7 kg), SpO2 98 %.  Examination: General Appearance: The patient is well-developed, well-nourished, and in no distress. Skin: Gross inspection of skin unremarkable. Head: normocephalic, no gross deformities. Eyes: no gross deformities noted. ENT: ears appear grossly normal no exudates. Neck: Supple. No thyromegaly. No LAD. Respiratory: clear bilaterally. Cardiovascular: Normal S1 and S2 without murmur or rub. Extremities: No cyanosis. pulses are equal. Neurologic: Alert and oriented. No involuntary movements.  LABS: No results found for this or any previous visit (from the past 2160 hour(s)).  Radiology: Mm 3d Screen Breast Bilateral  Result Date: 03/25/2018 CLINICAL DATA:  Screening. EXAM: DIGITAL SCREENING BILATERAL MAMMOGRAM WITH TOMO AND CAD COMPARISON:  Previous exam(s). ACR Breast Density Category b: There are scattered areas of fibroglandular density. FINDINGS: There are no findings suspicious for  malignancy. Images were processed with CAD. IMPRESSION: No mammographic evidence of malignancy. A result letter of this screening mammogram will be mailed directly to the patient. RECOMMENDATION: Screening mammogram in one year. (Code:SM-B-01Y) BI-RADS CATEGORY  1: Negative. Electronically Signed   By: Lajean Manes M.D.   On: 03/25/2018 15:34    No results found.  No results found.    Assessment and Plan: Patient Active Problem List   Diagnosis Date Noted  . Irritable bowel syndrome with diarrhea 09/04/2018  . Palpitations 08/12/2018  . Hyperlipidemia, mixed 08/02/2018  . Precordial pain 08/02/2018  . Family history of ovarian cancer 03/12/2016  . Vitamin B12 deficiency (non anemic) 11/18/2014  . Constipation   .  Asthma   . Allergic rhinitis   . Depression   . Anxiety   . Hematuria   . Insomnia   . GERD without esophagitis     1. OSA on CPAP Continue to use cpap as discussed.    2. Seasonal allergic rhinitis due to pollen Stable, continue present management.   3. GERD without esophagitis Doing well at this time.   4. Mild intermittent asthma without complication Stable, continue present management.   General Counseling: I have discussed the findings of the evaluation and examination with [place].  I have also discussed any further diagnostic evaluation thatmay be needed or ordered today. Jamilah verbalizes understanding of the findings of todays visit. We also reviewed her medications today and discussed drug interactions and side effects including but not limited excessive drowsiness and altered mental states. We also discussed that there is always a risk not just to her but also people around her. she has been encouraged to call the office with any questions or concerns that should arise related to todays visit.    Time spent: 15 This patient was seen by Orson Gear AGNP-C in Collaboration with Dr. Devona Konig as a part of collaborative care agreement.   I have personally  obtained a history, examined the patient, evaluated laboratory and imaging results, formulated the assessment and plan and placed orders.    Allyne Gee, MD [facility] Pulmonary and Critical Care Sleep medicine

## 2018-12-29 ENCOUNTER — Other Ambulatory Visit: Payer: Self-pay

## 2018-12-29 ENCOUNTER — Ambulatory Visit (INDEPENDENT_AMBULATORY_CARE_PROVIDER_SITE_OTHER): Payer: BC Managed Care – PPO | Admitting: Family Medicine

## 2018-12-29 ENCOUNTER — Encounter: Payer: Self-pay | Admitting: Family Medicine

## 2018-12-29 DIAGNOSIS — J069 Acute upper respiratory infection, unspecified: Secondary | ICD-10-CM

## 2018-12-29 DIAGNOSIS — Z20822 Contact with and (suspected) exposure to covid-19: Secondary | ICD-10-CM

## 2018-12-29 MED ORDER — PREDNISONE 50 MG PO TABS: 50.0000 mg | ORAL_TABLET | Freq: Every day | ORAL | 0 refills | Status: DC

## 2018-12-29 NOTE — Progress Notes (Signed)
There were no vitals taken for this visit.   Subjective:    Patient ID: [name], female    DOB: 24-Jul-1970, 48 y.o.   MRN: [id number]  HPI: [name] is a 48 y.o. female  Chief Complaint  Patient presents with  . URI    pt states she has had a cough, green phlegm, and congestion since Sunday   UPPER RESPIRATORY TRACT INFECTION Duration: Sunday (4 days) Worst symptom: cough and congestion Fever: no Cough: yes Shortness of breath: no Wheezing: no Chest pain: no Chest tightness: no Chest congestion: no Nasal congestion: yes Runny nose: yes Post nasal drip: yes Sneezing: no Sore throat: no Swollen glands: no Sinus pressure: yes Headache: no Face pain: no Toothache: no Ear pain: no  Ear pressure: no  Eyes red/itching:no Eye drainage/crusting: no  Vomiting: no Rash: no Fatigue: no Sick contacts: no Strep contacts: no  Context: stable Recurrent sinusitis: no Relief with OTC cold/cough medications: no  Treatments attempted: cold/sinus and pseudoephedrine   Relevant past medical, surgical, family and social history reviewed and updated as indicated. Interim medical history since our last visit reviewed. Allergies and medications reviewed and updated.  Review of Systems  Constitutional: Positive for fatigue. Negative for activity change, appetite change, chills, diaphoresis, fever and unexpected weight change.  HENT: Positive for congestion, postnasal drip, rhinorrhea and sinus pressure. Negative for dental problem, drooling, ear discharge, ear pain, facial swelling, hearing loss, mouth sores, nosebleeds, sinus pain, sneezing, sore throat, tinnitus, trouble swallowing and voice change.   Eyes: Negative.   Respiratory: Positive for cough. Negative for apnea, choking, chest tightness, shortness of breath, wheezing and stridor.   Cardiovascular: Negative.   Gastrointestinal: Negative.   Neurological: Negative.   Psychiatric/Behavioral: Negative.      Per HPI unless specifically indicated above     Objective:    There were no vitals taken for this visit.  Wt Readings from Last 3 Encounters:  12/14/18 200 lb (90.7 kg)  08/23/18 196 lb 12.8 oz (89.3 kg)  08/02/18 193 lb (87.5 kg)    Physical Exam Vitals signs and nursing note reviewed.  Constitutional:      General: She is not in acute distress.    Appearance: Normal appearance. She is not ill-appearing, toxic-appearing or diaphoretic.  HENT:     Head: Normocephalic and atraumatic.     Right Ear: External ear normal.     Left Ear: External ear normal.     Nose: Nose normal.     Mouth/Throat:     Mouth: Mucous membranes are moist.     Pharynx: Oropharynx is clear.  Eyes:     General: No scleral icterus.       Right eye: No discharge.        Left eye: No discharge.     Conjunctiva/sclera: Conjunctivae normal.     Pupils: Pupils are equal, round, and reactive to light.  Neck:     Musculoskeletal: Normal range of motion.  Pulmonary:     Effort: Pulmonary effort is normal. No respiratory distress.     Comments: Speaking in full sentences Musculoskeletal: Normal range of motion.  Skin:    Coloration: Skin is not jaundiced or pale.     Findings: No bruising, erythema, lesion or rash.  Neurological:     Mental Status: She is alert and oriented to person, place, and time. Mental status is at baseline.  Psychiatric:        Mood and Affect: Mood normal.  Behavior: Behavior normal.        Thought Content: Thought content normal.        Judgment: Judgment normal.     Results for orders placed or performed in visit on 07/29/18  Comprehensive metabolic panel  Result Value Ref Range   Glucose 97 65 - 99 mg/dL   BUN 13 6 - 24 mg/dL   Creatinine, Ser 0.69 0.57 - 1.00 mg/dL   GFR calc non Af Amer 103 >59 mL/min/1.73   GFR calc Af Amer 118 >59 mL/min/1.73   BUN/Creatinine Ratio 19 9 - 23   Sodium 138 134 - 144 mmol/L   Potassium 4.4 3.5 - 5.2 mmol/L   Chloride 101 96 -  106 mmol/L   CO2 23 20 - 29 mmol/L   Calcium 9.5 8.7 - 10.2 mg/dL   Total Protein 6.7 6.0 - 8.5 g/dL   Albumin 4.6 3.8 - 4.8 g/dL   Globulin, Total 2.1 1.5 - 4.5 g/dL   Albumin/Globulin Ratio 2.2 1.2 - 2.2   Bilirubin Total <0.2 0.0 - 1.2 mg/dL   Alkaline Phosphatase 64 39 - 117 IU/L   AST 17 0 - 40 IU/L   ALT 13 0 - 32 IU/L  CBC with Differential/Platelet  Result Value Ref Range   WBC 6.2 3.4 - 10.8 x10E3/uL   RBC 3.74 (L) 3.77 - 5.28 x10E6/uL   Hemoglobin 12.2 11.1 - 15.9 g/dL   Hematocrit 35.4 34.0 - 46.6 %   MCV 95 79 - 97 fL   MCH 32.6 26.6 - 33.0 pg   MCHC 34.5 31.5 - 35.7 g/dL   RDW 12.7 11.7 - 15.4 %   Platelets 262 150 - 450 x10E3/uL   Neutrophils 51 Not Estab. %   Lymphs 36 Not Estab. %   Monocytes 8 Not Estab. %   Eos 2 Not Estab. %   Basos 2 Not Estab. %   Neutrophils Absolute 3.2 1.4 - 7.0 x10E3/uL   Lymphocytes Absolute 2.3 0.7 - 3.1 x10E3/uL   Monocytes Absolute 0.5 0.1 - 0.9 x10E3/uL   EOS (ABSOLUTE) 0.2 0.0 - 0.4 x10E3/uL   Basophils Absolute 0.1 0.0 - 0.2 x10E3/uL   Immature Granulocytes 1 Not Estab. %   Immature Grans (Abs) 0.0 0.0 - 0.1 x10E3/uL  TSH  Result Value Ref Range   TSH 2.320 0.450 - 4.500 uIU/mL  H. pylori antibody, IgA([facility])  Result Value Ref Range   H. pylori, IgA Abs <9.0 0.0 - 8.9 units      Assessment & Plan:   Problem List Items Addressed This Visit    None    Visit Diagnoses    Upper respiratory tract infection, unspecified type    -  Primary   Will check for COVID and self-quarantine until results back. Will treat congestion with burst of prednisone. Call with any concerns or if not getting better.    Relevant Orders   Novel Coronavirus, NAA ([facility])       Follow up plan: Return if symptoms worsen or fail to improve.    . This visit was completed via FaceTime due to the restrictions of the COVID-19 pandemic. All issues as above were discussed and addressed. Physical exam was done as above through visual  confirmation on FaceTime. If it was felt that the patient should be evaluated in the office, they were directed there. The patient verbally consented to this visit. . Location of the patient: home . Location of the provider: home . Those involved with this call:  .  Provider: Park Liter, DO . CMA: Yvonna Alanis, [place] . Front Desk/Registration: Don Perking  . Time spent on call: 15 minutes with patient face to face via video conference. More than 50% of this time was spent in counseling and coordination of care. 23 minutes total spent in review of patient's record and preparation of their chart.

## 2018-12-30 LAB — NOVEL CORONAVIRUS, NAA: SARS-CoV-2, NAA: NOT DETECTED

## 2019-01-04 ENCOUNTER — Encounter: Payer: Self-pay | Admitting: Adult Health

## 2019-01-04 ENCOUNTER — Ambulatory Visit: Payer: Self-pay | Admitting: Adult Health

## 2019-01-04 ENCOUNTER — Other Ambulatory Visit: Payer: Self-pay

## 2019-01-04 VITALS — BP 130/84 | HR 77 | Temp 97.8°F | Resp 16 | Ht 68.0 in | Wt 196.6 lb

## 2019-01-04 DIAGNOSIS — K219 Gastro-esophageal reflux disease without esophagitis: Secondary | ICD-10-CM

## 2019-01-04 DIAGNOSIS — Z6829 Body mass index (BMI) 29.0-29.9, adult: Secondary | ICD-10-CM

## 2019-01-04 MED ORDER — PHENTERMINE HCL 37.5 MG PO CAPS: 37.5000 mg | ORAL_CAPSULE | ORAL | 0 refills | Status: DC

## 2019-01-04 NOTE — Progress Notes (Signed)
[facility] [place], [place] [address]  Internal MEDICINE  Office Visit Note  Patient Name: [name]  [id number]  [id number]  Date of Service: 01/16/2019  Chief Complaint  Patient presents with  . New Patient (Initial Visit)    WEIGHT MANAGEMENT    HPI  Pt is here to establish care for weight management.  She has tried (temporarily)  Phentermine in the past with some success and no side effects.  She would like to try this again.  She denies any cardiac issues, or blood pressure problems. Lab results from June of this year are WNL.     Current Medication: Outpatient Encounter Medications as of 01/04/2019  Medication Sig Note  . BLISOVI 24 FE 1-20 MG-MCG(24) tablet Take 1 tablet by mouth daily.   Marland Kitchen buPROPion (WELLBUTRIN XL) 300 MG 24 hr tablet TAKE 1 TABLET(300 MG) BY MOUTH DAILY   . cetirizine (ZYRTEC) 10 MG tablet Take 10 mg by mouth daily.   . cyanocobalamin 2000 MCG tablet Take 2,000 mcg by mouth daily.   Marland Kitchen escitalopram (LEXAPRO) 20 MG tablet Take 1 tablet (20 mg total) by mouth daily.   . fluticasone (FLONASE) 50 MCG/ACT nasal spray instill 2 sprays into each nostril once daily 11/28/2015: Received from: External Pharmacy  . Insulin Syringe-Needle U-100 (INSULIN SYRINGE 1CC/31GX5/16") 31G X 5/16" 1 ML MISC USE FOR ALLERGIES TWO TIMES A WEEK   . montelukast (SINGULAIR) 10 MG tablet Take 1 tablet (10 mg total) by mouth daily.   Marland Kitchen omeprazole (PRILOSEC) 20 MG capsule TAKE 1 CAPSULE(20 MG) BY MOUTH DAILY   . phentermine 37.5 MG capsule Take 37.5 mg by mouth every morning.   . predniSONE (DELTASONE) 50 MG tablet Take 1 tablet (50 mg total) by mouth daily with breakfast.   . roflumilast (DALIRESP) 500 MCG TABS tablet Take 1 tablet (500 mcg total) by mouth daily.   . sucralfate (CARAFATE) 1 GM/10ML suspension Take 10 mLs (1 g total) by mouth 4 (four) times daily -  with meals and at bedtime.   . VENTOLIN HFA 108 (90 Base) MCG/ACT inhaler inhale 2  puffs by mouth every 4 to 6 hours   . Vitamin D, Ergocalciferol, (DRISDOL) 50000 units CAPS capsule Take 50,000 Units by mouth once a week. 11/28/2015: Received from: External Pharmacy  . zolpidem (AMBIEN) 10 MG tablet Take 1 tablet (10 mg total) by mouth at bedtime as needed for sleep.   . phentermine 37.5 MG capsule Take 1 capsule (37.5 mg total) by mouth every morning.    No facility-administered encounter medications on file as of 01/04/2019.     Surgical History: Past Surgical History:  Procedure Laterality Date  . ABDOMINAL HYSTERECTOMY    . APPENDECTOMY    . OVARIAN CYST DRAINAGE Right Nov. 2003  . TONSILLECTOMY    . TUBAL LIGATION      Medical History: Past Medical History:  Diagnosis Date  . Allergic rhinitis   . Anxiety   . Asthma   . Constipation   . Depression   . Hematuria   . Insomnia   . Reflux     Family History: Family History  Problem Relation Age of Onset  . Cancer Mother        ovarian  . Stroke Father   . Cancer Maternal Uncle        lung  . Diabetes Maternal Grandmother   . Emphysema Maternal Grandfather   . Diabetes Paternal Grandmother   . Stroke Paternal  Grandfather   . Breast cancer Neg Hx     Social History   Socioeconomic History  . Marital status: Divorced    Spouse name: Not on file  . Number of children: Not on file  . Years of education: Not on file  . Highest education level: Not on file  Occupational History  . Not on file  Social Needs  . Financial resource strain: Not on file  . Food insecurity    Worry: Not on file    Inability: Not on file  . Transportation needs    Medical: Not on file    Non-medical: Not on file  Tobacco Use  . Smoking status: Never Smoker  . Smokeless tobacco: Never Used  Substance and Sexual Activity  . Alcohol use: Yes    Comment: occasional  . Drug use: No  . Sexual activity: Not on file  Lifestyle  . Physical activity    Days per week: Not on file    Minutes per session: Not on file   . Stress: Not on file  Relationships  . Social Herbalist on phone: Not on file    Gets together: Not on file    Attends religious service: Not on file    Active member of club or organization: Not on file    Attends meetings of clubs or organizations: Not on file    Relationship status: Not on file  . Intimate partner violence    Fear of current or ex partner: Not on file    Emotionally abused: Not on file    Physically abused: Not on file    Forced sexual activity: Not on file  Other Topics Concern  . Not on file  Social History Narrative  . Not on file      Review of Systems  Constitutional: Negative for chills, fatigue and unexpected weight change.  HENT: Negative for congestion, rhinorrhea, sneezing and sore throat.   Eyes: Negative for photophobia, pain and redness.  Respiratory: Negative for cough, chest tightness and shortness of breath.   Cardiovascular: Negative for chest pain and palpitations.  Gastrointestinal: Negative for abdominal pain, constipation, diarrhea, nausea and vomiting.  Endocrine: Negative.   Genitourinary: Negative for dysuria and frequency.  Musculoskeletal: Negative for arthralgias, back pain, joint swelling and neck pain.  Skin: Negative for rash.  Allergic/Immunologic: Negative.   Neurological: Negative for tremors and numbness.  Hematological: Negative for adenopathy. Does not bruise/bleed easily.  Psychiatric/Behavioral: Negative for behavioral problems and sleep disturbance. The patient is not nervous/anxious.     Vital Signs: BP 130/84   Pulse 77   Temp 97.8 F (36.6 C)   Resp 16   Ht 5\' 8"  (1.727 m)   Wt 196 lb 9.6 oz (89.2 kg)   SpO2 96%   BMI 29.89 kg/m    Physical Exam Vitals signs and nursing note reviewed.  Constitutional:      General: She is not in acute distress.    Appearance: She is well-developed. She is not diaphoretic.  HENT:     Head: Normocephalic and atraumatic.     Mouth/Throat:     Pharynx:  No oropharyngeal exudate.  Eyes:     Pupils: Pupils are equal, round, and reactive to light.  Neck:     Musculoskeletal: Normal range of motion and neck supple.     Thyroid: No thyromegaly.     Vascular: No JVD.     Trachea: No tracheal deviation.  Cardiovascular:  Rate and Rhythm: Normal rate and regular rhythm.     Heart sounds: Normal heart sounds. No murmur. No friction rub. No gallop.   Pulmonary:     Effort: Pulmonary effort is normal. No respiratory distress.     Breath sounds: Normal breath sounds. No wheezing or rales.  Chest:     Chest wall: No tenderness.  Abdominal:     Palpations: Abdomen is soft.     Tenderness: There is no abdominal tenderness. There is no guarding.  Musculoskeletal: Normal range of motion.  Lymphadenopathy:     Cervical: No cervical adenopathy.  Skin:    General: Skin is warm and dry.  Neurological:     Mental Status: She is alert and oriented to person, place, and time.     Cranial Nerves: No cranial nerve deficit.  Psychiatric:        Behavior: Behavior normal.        Thought Content: Thought content normal.        Judgment: Judgment normal.    Assessment/Plan:  1. BMI 29.0-29.9,adult Encouraged patient to start with 1400 calorie diet goal, and use phentermine as discussed. Goal for this month is 3 pound weight loss.  Obesity Counseling: Risk Assessment: An assessment of behavioral risk factors was made today and includes lack of exercise sedentary lifestyle, lack of portion control and poor dietary habits.  Risk Modification Advice: She was counseled on portion control guidelines. Restricting daily caloric intake to. . The detrimental long term effects of obesity on her health and ongoing poor compliance was also discussed with the patient.  There is a liability release in patients' chart. There has been a 10 minute discussion about the side effects including but not limited to elevated blood pressure, anxiety, lack of sleep and dry  mouth. Pt understands and will like to start/continue on appetite suppressant at this time. There will be one month RX given at the time of visit with proper follow up. Nova diet plan with restricted calories is given to the pt. Pt understands and agrees with  plan of treatment - phentermine 37.5 MG capsule; Take 1 capsule (37.5 mg total) by mouth every morning.  Dispense: 30 capsule; Refill: 0  2. GERD without esophagitis Continue to use omeprazole as prescribed.  General Counseling: Henriette verbalizes understanding of the findings of todays visit and agrees with plan of treatment. I have discussed any further diagnostic evaluation that may be needed or ordered today. We also reviewed her medications today. she has been encouraged to call the office with any questions or concerns that should arise related to todays visit.    No orders of the defined types were placed in this encounter.   Meds ordered this encounter  Medications  . phentermine 37.5 MG capsule    Sig: Take 1 capsule (37.5 mg total) by mouth every morning.    Dispense:  30 capsule    Refill:  0    Time spent: 25 Minutes   This patient was seen by Orson Gear AGNP-C in Collaboration with Dr Lavera Guise as a part of collaborative care agreement     Kendell Bane AGNP-C Internal medicine

## 2019-01-13 DIAGNOSIS — F411 Generalized anxiety disorder: Secondary | ICD-10-CM | POA: Diagnosis not present

## 2019-01-13 DIAGNOSIS — F331 Major depressive disorder, recurrent, moderate: Secondary | ICD-10-CM | POA: Diagnosis not present

## 2019-01-13 DIAGNOSIS — G4733 Obstructive sleep apnea (adult) (pediatric): Secondary | ICD-10-CM | POA: Diagnosis not present

## 2019-01-28 ENCOUNTER — Telehealth: Payer: Self-pay

## 2019-01-28 NOTE — Telephone Encounter (Signed)
Confirmed appointment with patient. klh °

## 2019-02-01 ENCOUNTER — Ambulatory Visit: Payer: BC Managed Care – PPO | Admitting: Adult Health

## 2019-02-01 ENCOUNTER — Telehealth: Payer: Self-pay

## 2019-02-01 NOTE — Telephone Encounter (Signed)
Patient rescheduled appointment on 02/01/2019 to 02/15/2019. klh

## 2019-02-09 DIAGNOSIS — Z20828 Contact with and (suspected) exposure to other viral communicable diseases: Secondary | ICD-10-CM | POA: Diagnosis not present

## 2019-02-11 ENCOUNTER — Telehealth: Payer: Self-pay

## 2019-02-11 NOTE — Telephone Encounter (Signed)
Confirmed appointment with patient. klh °

## 2019-02-15 ENCOUNTER — Other Ambulatory Visit: Payer: Self-pay

## 2019-02-15 ENCOUNTER — Ambulatory Visit: Payer: Self-pay | Admitting: Adult Health

## 2019-02-15 ENCOUNTER — Encounter: Payer: Self-pay | Admitting: Adult Health

## 2019-02-15 VITALS — Ht 68.0 in | Wt 185.8 lb

## 2019-02-15 DIAGNOSIS — Z6829 Body mass index (BMI) 29.0-29.9, adult: Secondary | ICD-10-CM

## 2019-02-15 DIAGNOSIS — G4733 Obstructive sleep apnea (adult) (pediatric): Secondary | ICD-10-CM

## 2019-02-15 DIAGNOSIS — Z9989 Dependence on other enabling machines and devices: Secondary | ICD-10-CM

## 2019-02-15 DIAGNOSIS — K219 Gastro-esophageal reflux disease without esophagitis: Secondary | ICD-10-CM

## 2019-02-15 MED ORDER — PHENTERMINE HCL 37.5 MG PO CAPS: 37.5000 mg | ORAL_CAPSULE | ORAL | 0 refills | Status: DC

## 2019-02-15 NOTE — Progress Notes (Signed)
[facility] [place], [place] [address]  Internal MEDICINE  Telephone Visit  Patient Name: [name]  [id number]  [id number]  Date of Service: 02/15/2019  I connected with the patient at 1115 by telephone and verified the patients identity using two identifiers.   I discussed the limitations, risks, security and privacy concerns of performing an evaluation and management service by telephone and the availability of in person appointments. I also discussed with the patient that there may be a patient responsible charge related to the service.  The patient expressed understanding and agrees to proceed.    Chief Complaint  Patient presents with  . Telephone Assessment    exposure to covid, tested negative, husband tested positive.   . Telephone Screen  . Follow-up    weight mangement     HPI  Pt seen via telephone for weight management.  She is quarantined currently awaiting covid results.  Patient is using phentermine for weight loss.  Since our last visit they have lost 11 pounds.  The patient denies any chest pain, palpitations, shortness of breath, constipation, headaches or any other side effects of the medication.  Patient wishes to continue to use this medication for weight loss at this time.   Current Medication: Outpatient Encounter Medications as of 02/15/2019  Medication Sig Note  . BLISOVI 24 FE 1-20 MG-MCG(24) tablet Take 1 tablet by mouth daily.   Marland Kitchen buPROPion (WELLBUTRIN XL) 300 MG 24 hr tablet TAKE 1 TABLET(300 MG) BY MOUTH DAILY   . cetirizine (ZYRTEC) 10 MG tablet Take 10 mg by mouth daily.   . cyanocobalamin 2000 MCG tablet Take 2,000 mcg by mouth daily.   Marland Kitchen escitalopram (LEXAPRO) 20 MG tablet Take 1 tablet (20 mg total) by mouth daily.   . fluticasone (FLONASE) 50 MCG/ACT nasal spray instill 2 sprays into each nostril once daily 11/28/2015: Received from: External Pharmacy  . Insulin Syringe-Needle U-100 (INSULIN SYRINGE  1CC/31GX5/16") 31G X 5/16" 1 ML MISC USE FOR ALLERGIES TWO TIMES A WEEK   . montelukast (SINGULAIR) 10 MG tablet Take 1 tablet (10 mg total) by mouth daily.   Marland Kitchen omeprazole (PRILOSEC) 20 MG capsule TAKE 1 CAPSULE(20 MG) BY MOUTH DAILY   . predniSONE (DELTASONE) 50 MG tablet Take 1 tablet (50 mg total) by mouth daily with breakfast.   . roflumilast (DALIRESP) 500 MCG TABS tablet Take 1 tablet (500 mcg total) by mouth daily.   . sucralfate (CARAFATE) 1 GM/10ML suspension Take 10 mLs (1 g total) by mouth 4 (four) times daily -  with meals and at bedtime.   . VENTOLIN HFA 108 (90 Base) MCG/ACT inhaler inhale 2 puffs by mouth every 4 to 6 hours   . Vitamin D, Ergocalciferol, (DRISDOL) 50000 units CAPS capsule Take 50,000 Units by mouth once a week. 11/28/2015: Received from: External Pharmacy  . zolpidem (AMBIEN) 10 MG tablet Take 1 tablet (10 mg total) by mouth at bedtime as needed for sleep.   . [DISCONTINUED] phentermine 37.5 MG capsule Take 37.5 mg by mouth every morning.   . [DISCONTINUED] phentermine 37.5 MG capsule Take 1 capsule (37.5 mg total) by mouth every morning.   . phentermine 37.5 MG capsule Take 1 capsule (37.5 mg total) by mouth every morning.    No facility-administered encounter medications on file as of 02/15/2019.    Surgical History: Past Surgical History:  Procedure Laterality Date  . ABDOMINAL HYSTERECTOMY    . APPENDECTOMY    . OVARIAN CYST DRAINAGE  Right Nov. 2003  . TONSILLECTOMY    . TUBAL LIGATION      Medical History: Past Medical History:  Diagnosis Date  . Allergic rhinitis   . Anxiety   . Asthma   . Constipation   . Depression   . Hematuria   . Insomnia   . Reflux     Family History: Family History  Problem Relation Age of Onset  . Cancer Mother        ovarian  . Stroke Father   . Cancer Maternal Uncle        lung  . Diabetes Maternal Grandmother   . Emphysema Maternal Grandfather   . Diabetes Paternal Grandmother   . Stroke Paternal  Grandfather   . Breast cancer Neg Hx     Social History   Socioeconomic History  . Marital status: Divorced    Spouse name: Not on file  . Number of children: Not on file  . Years of education: Not on file  . Highest education level: Not on file  Occupational History  . Not on file  Tobacco Use  . Smoking status: Never Smoker  . Smokeless tobacco: Never Used  Substance and Sexual Activity  . Alcohol use: Yes    Comment: occasional  . Drug use: No  . Sexual activity: Not on file  Other Topics Concern  . Not on file  Social History Narrative  . Not on file   Social Determinants of Health   Financial Resource Strain:   . Difficulty of Paying Living Expenses: Not on file  Food Insecurity:   . Worried About Charity fundraiser in the Last Year: Not on file  . Ran Out of Food in the Last Year: Not on file  Transportation Needs:   . Lack of Transportation (Medical): Not on file  . Lack of Transportation (Non-Medical): Not on file  Physical Activity:   . Days of Exercise per Week: Not on file  . Minutes of Exercise per Session: Not on file  Stress:   . Feeling of Stress : Not on file  Social Connections:   . Frequency of Communication with Friends and Family: Not on file  . Frequency of Social Gatherings with Friends and Family: Not on file  . Attends Religious Services: Not on file  . Active Member of Clubs or Organizations: Not on file  . Attends Archivist Meetings: Not on file  . Marital Status: Not on file  Intimate Partner Violence:   . Fear of Current or Ex-Partner: Not on file  . Emotionally Abused: Not on file  . Physically Abused: Not on file  . Sexually Abused: Not on file      Review of Systems  Constitutional: Negative for chills, fatigue and unexpected weight change.  HENT: Negative for congestion, rhinorrhea, sneezing and sore throat.   Eyes: Negative for photophobia, pain and redness.  Respiratory: Negative for cough, chest tightness and  shortness of breath.   Cardiovascular: Negative for chest pain and palpitations.  Gastrointestinal: Negative for abdominal pain, constipation, diarrhea, nausea and vomiting.  Endocrine: Negative.   Genitourinary: Negative for dysuria and frequency.  Musculoskeletal: Negative for arthralgias, back pain, joint swelling and neck pain.  Skin: Negative for rash.  Allergic/Immunologic: Negative.   Neurological: Negative for tremors and numbness.  Hematological: Negative for adenopathy. Does not bruise/bleed easily.  Psychiatric/Behavioral: Negative for behavioral problems and sleep disturbance. The patient is not nervous/anxious.     Vital Signs: Ht 5\' 8"  (  1.727 m)   Wt 185 lb 12.8 oz (84.3 kg)   BMI 28.25 kg/m    Observation/Objective:  Well sounding, NAd noted. Has some sinus symptoms.    Assessment/Plan: 1. GERD without esophagitis Stable, continue present management.   2. BMI 29.0-29.9,adult There is a liability release in patients' chart. There has been a 10 minute discussion about the side effects including but not limited to elevated blood pressure, anxiety, lack of sleep and dry mouth. Pt understands and will like to start/continue on appetite suppressant at this time. There will be one month RX given at the time of visit with proper follow up. Nova diet plan with restricted calories is given to the pt. Pt understands and agrees with  plan of treatment Obesity Counseling: Risk Assessment: An assessment of behavioral risk factors was made today and includes lack of exercise sedentary lifestyle, lack of portion control and poor dietary habits.  Risk Modification Advice: She was counseled on portion control guidelines. Restricting daily caloric intake to 1500. The detrimental long term effects of obesity on her health and ongoing poor compliance was also discussed with the patient.  3. OSA on CPAP Continue to wear cpap as directed.   General Counseling: Dora verbalizes  understanding of the findings of today's phone visit and agrees with plan of treatment. I have discussed any further diagnostic evaluation that may be needed or ordered today. We also reviewed her medications today. she has been encouraged to call the office with any questions or concerns that should arise related to todays visit.    No orders of the defined types were placed in this encounter.   Meds ordered this encounter  Medications  . phentermine 37.5 MG capsule    Sig: Take 1 capsule (37.5 mg total) by mouth every morning.    Dispense:  30 capsule    Refill:  0    Time spent: [place] AGNP-C Internal medicine

## 2019-02-24 ENCOUNTER — Other Ambulatory Visit: Payer: Self-pay | Admitting: Adult Health

## 2019-02-24 DIAGNOSIS — F5101 Primary insomnia: Secondary | ICD-10-CM

## 2019-02-24 NOTE — Telephone Encounter (Signed)
Last 02/15/19 and next 03/16/19

## 2019-03-14 ENCOUNTER — Telehealth: Payer: Self-pay

## 2019-03-14 NOTE — Telephone Encounter (Signed)
Confirmed virtual visit with patient. klh 

## 2019-03-15 DIAGNOSIS — G4733 Obstructive sleep apnea (adult) (pediatric): Secondary | ICD-10-CM | POA: Diagnosis not present

## 2019-03-15 DIAGNOSIS — F411 Generalized anxiety disorder: Secondary | ICD-10-CM | POA: Diagnosis not present

## 2019-03-15 DIAGNOSIS — F331 Major depressive disorder, recurrent, moderate: Secondary | ICD-10-CM | POA: Diagnosis not present

## 2019-03-16 ENCOUNTER — Ambulatory Visit: Payer: BC Managed Care – PPO | Admitting: Adult Health

## 2019-03-17 ENCOUNTER — Ambulatory Visit: Payer: BC Managed Care – PPO | Admitting: Adult Health

## 2019-03-17 ENCOUNTER — Encounter: Payer: Self-pay | Admitting: Adult Health

## 2019-03-17 VITALS — Ht 68.0 in | Wt 181.6 lb

## 2019-03-17 DIAGNOSIS — K219 Gastro-esophageal reflux disease without esophagitis: Secondary | ICD-10-CM | POA: Diagnosis not present

## 2019-03-17 DIAGNOSIS — Z9989 Dependence on other enabling machines and devices: Secondary | ICD-10-CM

## 2019-03-17 DIAGNOSIS — G4733 Obstructive sleep apnea (adult) (pediatric): Secondary | ICD-10-CM | POA: Diagnosis not present

## 2019-03-17 DIAGNOSIS — Z6829 Body mass index (BMI) 29.0-29.9, adult: Secondary | ICD-10-CM

## 2019-03-17 DIAGNOSIS — F5101 Primary insomnia: Secondary | ICD-10-CM | POA: Diagnosis not present

## 2019-03-17 MED ORDER — PHENTERMINE HCL 37.5 MG PO CAPS: 37.5000 mg | ORAL_CAPSULE | ORAL | 0 refills | Status: DC

## 2019-03-17 MED ORDER — ZOLPIDEM TARTRATE 10 MG PO TABS: 10.0000 mg | ORAL_TABLET | Freq: Every evening | ORAL | 1 refills | Status: DC | PRN

## 2019-03-17 NOTE — Progress Notes (Signed)
[facility] [place], [place] [address]  Internal MEDICINE  Telephone Visit  Patient Name: [name]  [id number]  [id number]  Date of Service: 03/17/2019  I connected with the patient at  349 by telephone and verified the patients identity using two identifiers.   I discussed the limitations, risks, security and privacy concerns of performing an evaluation and management service by telephone and the availability of in person appointments. I also discussed with the patient that there may be a patient responsible charge related to the service.  The patient expressed understanding and agrees to proceed.    Chief Complaint  Patient presents with  . Telephone Assessment  . Telephone Screen  . Medical Management of Chronic Issues    weight loss   . Medication Refill    ambien    HPI  Pt seen via video.  She is seen for follow up on weight loss. Patient is using phentermine for weight loss.  Since our last visit they have lost 4 pounds.  The patient denies any chest pain, palpitations, shortness of breath, constipation, headaches or any other side effects of the medication.  Patient wishes to continue to use this medication for weight loss at this time.   Current Medication: Outpatient Encounter Medications as of 03/17/2019  Medication Sig Note  . BLISOVI 24 FE 1-20 MG-MCG(24) tablet Take 1 tablet by mouth daily.   Marland Kitchen buPROPion (WELLBUTRIN XL) 300 MG 24 hr tablet TAKE 1 TABLET(300 MG) BY MOUTH DAILY   . cetirizine (ZYRTEC) 10 MG tablet Take 10 mg by mouth daily.   . cyanocobalamin 2000 MCG tablet Take 2,000 mcg by mouth daily.   Marland Kitchen escitalopram (LEXAPRO) 20 MG tablet Take 1 tablet (20 mg total) by mouth daily.   . fluticasone (FLONASE) 50 MCG/ACT nasal spray instill 2 sprays into each nostril once daily 11/28/2015: Received from: External Pharmacy  . Insulin Syringe-Needle U-100 (INSULIN SYRINGE 1CC/31GX5/16") 31G X 5/16" 1 ML MISC USE FOR ALLERGIES TWO TIMES  A WEEK   . montelukast (SINGULAIR) 10 MG tablet Take 1 tablet (10 mg total) by mouth daily.   Marland Kitchen omeprazole (PRILOSEC) 20 MG capsule TAKE 1 CAPSULE(20 MG) BY MOUTH DAILY   . phentermine 37.5 MG capsule Take 1 capsule (37.5 mg total) by mouth every morning.   . predniSONE (DELTASONE) 50 MG tablet Take 1 tablet (50 mg total) by mouth daily with breakfast.   . roflumilast (DALIRESP) 500 MCG TABS tablet Take 1 tablet (500 mcg total) by mouth daily.   . sucralfate (CARAFATE) 1 GM/10ML suspension Take 10 mLs (1 g total) by mouth 4 (four) times daily -  with meals and at bedtime.   . VENTOLIN HFA 108 (90 Base) MCG/ACT inhaler inhale 2 puffs by mouth every 4 to 6 hours   . Vitamin D, Ergocalciferol, (DRISDOL) 50000 units CAPS capsule Take 50,000 Units by mouth once a week. 11/28/2015: Received from: External Pharmacy  . zolpidem (AMBIEN) 10 MG tablet Take 1 tablet (10 mg total) by mouth at bedtime as needed. for sleep   . [DISCONTINUED] phentermine 37.5 MG capsule Take 1 capsule (37.5 mg total) by mouth every morning.   . [DISCONTINUED] zolpidem (AMBIEN) 10 MG tablet TAKE ONE TABLET BY MOUTH AT BEDTIME AS NEEDED FOR SLEEP    No facility-administered encounter medications on file as of 03/17/2019.    Surgical History: Past Surgical History:  Procedure Laterality Date  . ABDOMINAL HYSTERECTOMY    . APPENDECTOMY    .  OVARIAN CYST DRAINAGE Right Nov. 2003  . TONSILLECTOMY    . TUBAL LIGATION      Medical History: Past Medical History:  Diagnosis Date  . Allergic rhinitis   . Anxiety   . Asthma   . Constipation   . Depression   . Hematuria   . Insomnia   . Reflux     Family History: Family History  Problem Relation Age of Onset  . Cancer Mother        ovarian  . Stroke Father   . Cancer Maternal Uncle        lung  . Diabetes Maternal Grandmother   . Emphysema Maternal Grandfather   . Diabetes Paternal Grandmother   . Stroke Paternal Grandfather   . Breast cancer Neg Hx      Social History   Socioeconomic History  . Marital status: Divorced    Spouse name: Not on file  . Number of children: Not on file  . Years of education: Not on file  . Highest education level: Not on file  Occupational History  . Not on file  Tobacco Use  . Smoking status: Never Smoker  . Smokeless tobacco: Never Used  Substance and Sexual Activity  . Alcohol use: Yes    Comment: occasional  . Drug use: No  . Sexual activity: Not on file  Other Topics Concern  . Not on file  Social History Narrative  . Not on file   Social Determinants of Health   Financial Resource Strain:   . Difficulty of Paying Living Expenses: Not on file  Food Insecurity:   . Worried About Charity fundraiser in the Last Year: Not on file  . Ran Out of Food in the Last Year: Not on file  Transportation Needs:   . Lack of Transportation (Medical): Not on file  . Lack of Transportation (Non-Medical): Not on file  Physical Activity:   . Days of Exercise per Week: Not on file  . Minutes of Exercise per Session: Not on file  Stress:   . Feeling of Stress : Not on file  Social Connections:   . Frequency of Communication with Friends and Family: Not on file  . Frequency of Social Gatherings with Friends and Family: Not on file  . Attends Religious Services: Not on file  . Active Member of Clubs or Organizations: Not on file  . Attends Archivist Meetings: Not on file  . Marital Status: Not on file  Intimate Partner Violence:   . Fear of Current or Ex-Partner: Not on file  . Emotionally Abused: Not on file  . Physically Abused: Not on file  . Sexually Abused: Not on file      Review of Systems  Constitutional: Negative for chills, fatigue and unexpected weight change.  HENT: Negative for congestion, rhinorrhea, sneezing and sore throat.   Eyes: Negative for photophobia, pain and redness.  Respiratory: Negative for cough, chest tightness and shortness of breath.    Cardiovascular: Negative for chest pain and palpitations.  Gastrointestinal: Negative for abdominal pain, constipation, diarrhea, nausea and vomiting.  Endocrine: Negative.   Genitourinary: Negative for dysuria and frequency.  Musculoskeletal: Negative for arthralgias, back pain, joint swelling and neck pain.  Skin: Negative for rash.  Allergic/Immunologic: Negative.   Neurological: Negative for tremors and numbness.  Hematological: Negative for adenopathy. Does not bruise/bleed easily.  Psychiatric/Behavioral: Negative for behavioral problems and sleep disturbance. The patient is not nervous/anxious.     Vital Signs:  Ht 5\' 8"  (1.727 m)   Wt 181 lb 9.6 oz (82.4 kg)   BMI 27.61 kg/m    Observation/Objective:  Well appearing NAD   Assessment/Plan: 1. GERD without esophagitis Stable, continue present management.  2. BMI 29.0-29.9,adult Obesity Counseling: Risk Assessment: An assessment of behavioral risk factors was made today and includes lack of exercise sedentary lifestyle, lack of portion control and poor dietary habits.  Risk Modification Advice: She was counseled on portion control guidelines. Restricting daily caloric intake to 1600. The detrimental long term effects of obesity on her health and ongoing poor compliance was also discussed with the patient. - phentermine 37.5 MG capsule; Take 1 capsule (37.5 mg total) by mouth every morning.  Dispense: 30 capsule; Refill: 0  3. OSA on CPAP Continue to use cpap as directed.   4. Primary insomnia Refilled ambien at this time.  Reviewed risks and possible side effects associated with taking opiates, benzodiazepines and other CNS depressants. Combination of these could cause dizziness and drowsiness. Advised patient not to drive or operate machinery when taking these medications, as patient's and other's life can be at risk and will have consequences. Patient verbalized understanding in this matter. Dependence and abuse for  these drugs will be monitored closely. A Controlled substance policy and procedure is on file which allows [facility] to order a urine drug screen test at any visit. Patient understands and agrees with the plan - zolpidem (AMBIEN) 10 MG tablet; Take 1 tablet (10 mg total) by mouth at bedtime as needed. for sleep  Dispense: 30 tablet; Refill: 1  General Counseling: [name] verbalizes understanding of the findings of today's phone visit and agrees with plan of treatment. I have discussed any further diagnostic evaluation that may be needed or ordered today. We also reviewed her medications today. she has been encouraged to call the office with any questions or concerns that should arise related to todays visit.    No orders of the defined types were placed in this encounter.   Meds ordered this encounter  Medications  . phentermine 37.5 MG capsule    Sig: Take 1 capsule (37.5 mg total) by mouth every morning.    Dispense:  30 capsule    Refill:  0  . zolpidem (AMBIEN) 10 MG tablet    Sig: Take 1 tablet (10 mg total) by mouth at bedtime as needed. for sleep    Dispense:  30 tablet    Refill:  1    Time spent: [place] AGNP-C Internal medicine.

## 2019-03-21 ENCOUNTER — Telehealth: Payer: Self-pay

## 2019-03-21 MED ORDER — ALBUTEROL SULFATE HFA 108 (90 BASE) MCG/ACT IN AERS: INHALATION_SPRAY | RESPIRATORY_TRACT | 3 refills | Status: DC

## 2019-03-21 NOTE — Telephone Encounter (Signed)
Please send in a prescription that can be filled as a generic.

## 2019-03-25 ENCOUNTER — Other Ambulatory Visit: Payer: Self-pay | Admitting: Family Medicine

## 2019-03-25 NOTE — Telephone Encounter (Signed)
Forwarding medication refill to PCP for review.  Patient requesting 90 day supply.

## 2019-03-28 NOTE — Telephone Encounter (Signed)
Tried to to PA through cover my meds and it would not let me. Patient provided a phone number from her card.  Spoke with someone and the PA has to be done and faxed to [facility].  Form printed out and will complete.

## 2019-03-28 NOTE — Telephone Encounter (Signed)
Patient called to check the status of her refill request.  She stated that the pharmacy said they do not have her medication.  Please advise.

## 2019-03-28 NOTE — Telephone Encounter (Addendum)
Pt following up on this refill request albuterol (VENTOLIN HFA) 108 (90 Base) MCG/ACT inhaler As pt states they advised this med needs a prior auth.  Pt has a new Rx plan added this year (not new insurance)  Prescription Bin no:  [id number] Rx Group no:  [id number]  Pt does not want generic- needs a PA

## 2019-03-29 ENCOUNTER — Other Ambulatory Visit: Payer: Self-pay | Admitting: Obstetrics & Gynecology

## 2019-03-29 DIAGNOSIS — Z1231 Encounter for screening mammogram for malignant neoplasm of breast: Secondary | ICD-10-CM

## 2019-03-29 DIAGNOSIS — Z8041 Family history of malignant neoplasm of ovary: Secondary | ICD-10-CM | POA: Diagnosis not present

## 2019-03-29 DIAGNOSIS — Z01419 Encounter for gynecological examination (general) (routine) without abnormal findings: Secondary | ICD-10-CM | POA: Diagnosis not present

## 2019-03-29 DIAGNOSIS — N951 Menopausal and female climacteric states: Secondary | ICD-10-CM | POA: Diagnosis not present

## 2019-03-29 DIAGNOSIS — Z13220 Encounter for screening for lipoid disorders: Secondary | ICD-10-CM | POA: Diagnosis not present

## 2019-03-29 DIAGNOSIS — Z1211 Encounter for screening for malignant neoplasm of colon: Secondary | ICD-10-CM | POA: Diagnosis not present

## 2019-03-29 DIAGNOSIS — E538 Deficiency of other specified B group vitamins: Secondary | ICD-10-CM | POA: Diagnosis not present

## 2019-03-29 DIAGNOSIS — Z131 Encounter for screening for diabetes mellitus: Secondary | ICD-10-CM | POA: Diagnosis not present

## 2019-03-29 DIAGNOSIS — E559 Vitamin D deficiency, unspecified: Secondary | ICD-10-CM | POA: Diagnosis not present

## 2019-03-30 NOTE — Telephone Encounter (Signed)
Fax from [facility].  They need documentation of failure with generic albuterol inhaler.   Patient states that she's tried generic multiple times, but it doesn't work for her. Only Ventolin Brand.  Could we provide letter with documentation?  Putting form in review folder for Dr. Wynetta Emery.

## 2019-03-30 NOTE — Telephone Encounter (Signed)
PA

## 2019-04-11 ENCOUNTER — Telehealth: Payer: Self-pay

## 2019-04-11 NOTE — Telephone Encounter (Signed)
Confirmed appointment on 04/13/2019 and screened for covid. klh 

## 2019-04-12 ENCOUNTER — Other Ambulatory Visit: Payer: Self-pay | Admitting: Family Medicine

## 2019-04-12 NOTE — Telephone Encounter (Signed)
Requested medication (s) are due for refill today: Escitalopram & Bupropion  Requested medication (s) are on the active medication list: yes  Last refill:  01/12/2019  Future visit scheduled: no  Notes to clinic: no valid encounter within last 6 months   Requested Prescriptions  Pending Prescriptions Disp Refills   escitalopram (LEXAPRO) 20 MG tablet [Pharmacy Med Name: ESCITALOPRAM 20MG  TABLETS] 90 tablet 1    Sig: TAKE 1 TABLET(20 MG) BY MOUTH DAILY      Psychiatry:  Antidepressants - SSRI Failed - 04/12/2019  3:22 AM      Failed - Valid encounter within last 6 months    Recent Outpatient Visits           3 months ago Upper respiratory tract infection, unspecified type   [facility], [name], DO   7 months ago Chest pain, unspecified type   [facility], [name], DO   8 months ago Chest pain, unspecified type   [facility], [name], DO   8 months ago Mild episode of recurrent major depressive disorder [facility])   [place], [name], DO   11 months ago Routine general medical examination at a health care facility   [facility], [name], DO              Passed - Completed PHQ-2 or PHQ-9 in the last 360 days.        buPROPion (WELLBUTRIN XL) 300 MG 24 hr tablet [Pharmacy Med Name: BUPROPION XL 300MG  TABLETS] 90 tablet 1    Sig: TAKE 1 TABLET(300 MG) BY MOUTH DAILY      Psychiatry: Antidepressants - bupropion Failed - 04/12/2019  3:22 AM      Failed - Valid encounter within last 6 months    Recent Outpatient Visits           3 months ago Upper respiratory tract infection, unspecified type   [facility], [name], DO   7 months ago Chest pain, unspecified type   [facility], [name], DO   8 months ago Chest pain, unspecified type   [facility], [name], DO   8 months ago Mild episode of recurrent major  depressive disorder [facility])   [place], [name], DO   11 months ago Routine general medical examination at a health care facility   [facility], [name], DO              Passed - Completed PHQ-2 or PHQ-9 in the last 360 days.      Passed - Last BP in normal range    BP Readings from Last 1 Encounters:  01/04/19 130/84

## 2019-04-13 ENCOUNTER — Telehealth: Payer: Self-pay

## 2019-04-13 ENCOUNTER — Ambulatory Visit: Payer: BC Managed Care – PPO | Admitting: Adult Health

## 2019-04-13 NOTE — Telephone Encounter (Signed)
Patient rescheduled appointment on 04/13/2019 to 04/25/2019 patient was not feeling well. klh

## 2019-04-15 DIAGNOSIS — G4733 Obstructive sleep apnea (adult) (pediatric): Secondary | ICD-10-CM | POA: Diagnosis not present

## 2019-04-15 DIAGNOSIS — F411 Generalized anxiety disorder: Secondary | ICD-10-CM | POA: Diagnosis not present

## 2019-04-15 DIAGNOSIS — F331 Major depressive disorder, recurrent, moderate: Secondary | ICD-10-CM | POA: Diagnosis not present

## 2019-04-18 ENCOUNTER — Ambulatory Visit
Admission: RE | Admit: 2019-04-18 | Discharge: 2019-04-18 | Disposition: A | Discharge status: Home / Self Care | Payer: BC Managed Care – PPO | Source: Ambulatory Visit | Attending: Obstetrics & Gynecology | Admitting: Obstetrics & Gynecology

## 2019-04-18 DIAGNOSIS — Z1231 Encounter for screening mammogram for malignant neoplasm of breast: Secondary | ICD-10-CM | POA: Diagnosis not present

## 2019-04-19 ENCOUNTER — Other Ambulatory Visit: Payer: Self-pay | Admitting: Internal Medicine

## 2019-04-20 ENCOUNTER — Telehealth: Payer: Self-pay

## 2019-04-20 NOTE — Telephone Encounter (Signed)
Confirmed appointment on 04/25/2019 and screened for covid. klh

## 2019-04-25 ENCOUNTER — Ambulatory Visit: Payer: BC Managed Care – PPO | Admitting: Adult Health

## 2019-05-09 ENCOUNTER — Telehealth: Payer: Self-pay

## 2019-05-09 NOTE — Telephone Encounter (Signed)
Confirmed appointment on 05/11/2019 and screened for covid. klh  

## 2019-05-10 NOTE — Telephone Encounter (Signed)
Patient advised of appt and confirmed

## 2019-05-11 ENCOUNTER — Other Ambulatory Visit: Payer: Self-pay

## 2019-05-11 ENCOUNTER — Ambulatory Visit: Payer: BC Managed Care – PPO

## 2019-05-11 ENCOUNTER — Ambulatory Visit: Payer: BC Managed Care – PPO | Admitting: Adult Health

## 2019-05-11 ENCOUNTER — Telehealth: Payer: Self-pay

## 2019-05-11 ENCOUNTER — Encounter: Payer: Self-pay | Admitting: Adult Health

## 2019-05-11 VITALS — BP 132/81 | HR 91 | Temp 97.2°F | Resp 16 | Ht 68.0 in | Wt 185.4 lb

## 2019-05-11 DIAGNOSIS — Z9989 Dependence on other enabling machines and devices: Secondary | ICD-10-CM | POA: Diagnosis not present

## 2019-05-11 DIAGNOSIS — R198 Other specified symptoms and signs involving the digestive system and abdomen: Secondary | ICD-10-CM

## 2019-05-11 DIAGNOSIS — Z6828 Body mass index (BMI) 28.0-28.9, adult: Secondary | ICD-10-CM

## 2019-05-11 DIAGNOSIS — G4733 Obstructive sleep apnea (adult) (pediatric): Secondary | ICD-10-CM

## 2019-05-11 NOTE — Telephone Encounter (Signed)
Patient did not schedule appointment at check out will call back to schedule. klh

## 2019-05-11 NOTE — Progress Notes (Signed)
[facility] [place], [place] [address]  Internal MEDICINE  Office Visit Note  Patient Name: [name]  [id number]  [id number]  Date of Service: 05/11/2019  Chief Complaint  Patient presents with  . Follow-up    wt loss  . Depression    HPI  Pt is here for follow up on weight loss. She has gained 2 pounds since her last visit.  She repots over the last 3 weeks she has not been able to take the pill consistently due to difficulty swallowing. She reports vomiting and gagging due to this. She denies any history of thyroid or esophageal issues.     Current Medication: Outpatient Encounter Medications as of 05/11/2019  Medication Sig Note  . albuterol (VENTOLIN HFA) 108 (90 Base) MCG/ACT inhaler inhale 2 puffs by mouth every 4 to 6 hours   . BLISOVI 24 FE 1-20 MG-MCG(24) tablet Take 1 tablet by mouth daily.   Marland Kitchen buPROPion (WELLBUTRIN XL) 300 MG 24 hr tablet TAKE 1 TABLET(300 MG) BY MOUTH DAILY   . cetirizine (ZYRTEC) 10 MG tablet Take 10 mg by mouth daily.   . cyanocobalamin 2000 MCG tablet Take 2,000 mcg by mouth daily.   Marland Kitchen DALIRESP 500 MCG TABS tablet TAKE 1 TABLET BY MOUTH DAILY   . escitalopram (LEXAPRO) 20 MG tablet TAKE 1 TABLET(20 MG) BY MOUTH DAILY   . fluticasone (FLONASE) 50 MCG/ACT nasal spray instill 2 sprays into each nostril once daily 11/28/2015: Received from: External Pharmacy  . Insulin Syringe-Needle U-100 (INSULIN SYRINGE 1CC/31GX5/16") 31G X 5/16" 1 ML MISC USE FOR ALLERGIES TWO TIMES A WEEK   . montelukast (SINGULAIR) 10 MG tablet TAKE 1 TABLET(10 MG) BY MOUTH DAILY   . omeprazole (PRILOSEC) 20 MG capsule TAKE 1 CAPSULE(20 MG) BY MOUTH DAILY   . phentermine 37.5 MG capsule Take 1 capsule (37.5 mg total) by mouth every morning.   . predniSONE (DELTASONE) 50 MG tablet Take 1 tablet (50 mg total) by mouth daily with breakfast.   . sucralfate (CARAFATE) 1 GM/10ML suspension Take 10 mLs (1 g total) by mouth 4 (four) times daily -  with  meals and at bedtime.   . Vitamin D, Ergocalciferol, (DRISDOL) 50000 units CAPS capsule Take 50,000 Units by mouth once a week. 11/28/2015: Received from: External Pharmacy  . zolpidem (AMBIEN) 10 MG tablet Take 1 tablet (10 mg total) by mouth at bedtime as needed. for sleep    No facility-administered encounter medications on file as of 05/11/2019.    Surgical History: Past Surgical History:  Procedure Laterality Date  . ABDOMINAL HYSTERECTOMY    . APPENDECTOMY    . OVARIAN CYST DRAINAGE Right Nov. 2003  . TONSILLECTOMY    . TUBAL LIGATION      Medical History: Past Medical History:  Diagnosis Date  . Allergic rhinitis   . Anxiety   . Asthma   . Constipation   . Depression   . Hematuria   . Insomnia   . Reflux     Family History: Family History  Problem Relation Age of Onset  . Cancer Mother        ovarian  . Stroke Father   . Cancer Maternal Uncle        lung  . Diabetes Maternal Grandmother   . Emphysema Maternal Grandfather   . Diabetes Paternal Grandmother   . Stroke Paternal Grandfather   . Breast cancer Neg Hx     Social History   Socioeconomic History  .  Marital status: Divorced    Spouse name: Not on file  . Number of children: Not on file  . Years of education: Not on file  . Highest education level: Not on file  Occupational History  . Not on file  Tobacco Use  . Smoking status: Never Smoker  . Smokeless tobacco: Never Used  Substance and Sexual Activity  . Alcohol use: Yes    Comment: occasional  . Drug use: No  . Sexual activity: Not on file  Other Topics Concern  . Not on file  Social History Narrative  . Not on file   Social Determinants of Health   Financial Resource Strain:   . Difficulty of Paying Living Expenses:   Food Insecurity:   . Worried About Charity fundraiser in the Last Year:   . Arboriculturist in the Last Year:   Transportation Needs:   . Film/video editor (Medical):   Marland Kitchen Lack of Transportation  (Non-Medical):   Physical Activity:   . Days of Exercise per Week:   . Minutes of Exercise per Session:   Stress:   . Feeling of Stress :   Social Connections:   . Frequency of Communication with Friends and Family:   . Frequency of Social Gatherings with Friends and Family:   . Attends Religious Services:   . Active Member of Clubs or Organizations:   . Attends Archivist Meetings:   Marland Kitchen Marital Status:   Intimate Partner Violence:   . Fear of Current or Ex-Partner:   . Emotionally Abused:   Marland Kitchen Physically Abused:   . Sexually Abused:       Review of Systems  Constitutional: Negative for chills, fatigue and unexpected weight change.  HENT: Negative for congestion, rhinorrhea, sneezing and sore throat.   Eyes: Negative for photophobia, pain and redness.  Respiratory: Negative for cough, chest tightness and shortness of breath.   Cardiovascular: Negative for chest pain and palpitations.  Gastrointestinal: Negative for abdominal pain, constipation, diarrhea, nausea and vomiting.  Endocrine: Negative.   Genitourinary: Negative for dysuria and frequency.  Musculoskeletal: Negative for arthralgias, back pain, joint swelling and neck pain.  Skin: Negative for rash.  Allergic/Immunologic: Negative.   Neurological: Negative for tremors and numbness.  Hematological: Negative for adenopathy. Does not bruise/bleed easily.  Psychiatric/Behavioral: Negative for behavioral problems and sleep disturbance. The patient is not nervous/anxious.     Vital Signs: BP 132/81   Pulse 91   Temp (!) 97.2 F (36.2 C)   Resp 16   Ht 5\' 8"  (1.727 m)   Wt 185 lb 6.4 oz (84.1 kg)   SpO2 98%   BMI 28.19 kg/m    Physical Exam Vitals and nursing note reviewed.  Constitutional:      General: She is not in acute distress.    Appearance: She is well-developed. She is not diaphoretic.  HENT:     Head: Normocephalic and atraumatic.     Mouth/Throat:     Pharynx: No oropharyngeal exudate.   Eyes:     Pupils: Pupils are equal, round, and reactive to light.  Neck:     Thyroid: No thyromegaly.     Vascular: No JVD.     Trachea: No tracheal deviation.  Cardiovascular:     Rate and Rhythm: Normal rate and regular rhythm.     Heart sounds: Normal heart sounds. No murmur. No friction rub. No gallop.   Pulmonary:     Effort: Pulmonary effort is normal.  No respiratory distress.     Breath sounds: Normal breath sounds. No wheezing or rales.  Chest:     Chest wall: No tenderness.  Abdominal:     Palpations: Abdomen is soft.     Tenderness: There is no abdominal tenderness. There is no guarding.  Musculoskeletal:        General: Normal range of motion.     Cervical back: Normal range of motion and neck supple.  Lymphadenopathy:     Cervical: No cervical adenopathy.  Skin:    General: Skin is warm and dry.  Neurological:     Mental Status: She is alert and oriented to person, place, and time.     Cranial Nerves: No cranial nerve deficit.  Psychiatric:        Behavior: Behavior normal.        Thought Content: Thought content normal.        Judgment: Judgment normal.    Assessment/Plan: 1. BMI 28.0-28.9,adult Patient has 2 weeks of medication left.  Will hold refill for now until she can see primary to investigate difficulty swallowing.  Obesity Counseling: Risk Assessment: An assessment of behavioral risk factors was made today and includes lack of exercise sedentary lifestyle, lack of portion control and poor dietary habits.  Risk Modification Advice: She was counseled on portion control guidelines. Restricting daily caloric intake to 1700. The detrimental long term effects of obesity on her health and ongoing poor compliance was also discussed with the patient.  2. OSA on CPAP continue to wear cpap as directed.   3. Difficulty swallowing pills Thyroid does not appear enlarged.  Due to this blocking sensation, encouraged patient to see her PCP immediately for  evaluation.   General Counseling: Tawsha verbalizes understanding of the findings of todays visit and agrees with plan of treatment. I have discussed any further diagnostic evaluation that may be needed or ordered today. We also reviewed her medications today. she has been encouraged to call the office with any questions or concerns that should arise related to todays visit.    No orders of the defined types were placed in this encounter.   No orders of the defined types were placed in this encounter.   Time spent: 25 Minutes   This patient was seen by Orson Gear AGNP-C in Collaboration with Dr Lavera Guise as a part of collaborative care agreement     Kendell Bane AGNP-C Internal medicine

## 2019-05-11 NOTE — Progress Notes (Signed)
95 percentile pressure 8.5   95th percentile leak 0.9   apnea index 0.1 /hr  apnea-hypopnea index  0.1 /hr   total days used  >4 hr 4 days  total days used <4 hr 21 days  Total compliance 83 percent

## 2019-05-12 ENCOUNTER — Encounter: Payer: Self-pay | Admitting: Family Medicine

## 2019-05-12 ENCOUNTER — Telehealth (INDEPENDENT_AMBULATORY_CARE_PROVIDER_SITE_OTHER): Payer: BC Managed Care – PPO | Admitting: Family Medicine

## 2019-05-12 DIAGNOSIS — R1310 Dysphagia, unspecified: Secondary | ICD-10-CM | POA: Diagnosis not present

## 2019-05-12 DIAGNOSIS — R1319 Other dysphagia: Secondary | ICD-10-CM

## 2019-05-12 MED ORDER — OMEPRAZOLE 40 MG PO CPDR: 40.0000 mg | DELAYED_RELEASE_CAPSULE | Freq: Every day | ORAL | 1 refills | Status: DC

## 2019-05-12 NOTE — Progress Notes (Signed)
There were no vitals taken for this visit.   Subjective:    Patient ID: [name], female    DOB: 10-15-1970, 49 y.o.   MRN: [id number]  HPI: [name] is a 49 y.o. female  Chief Complaint  Patient presents with  . Oral Swelling    pt states she has had a lump in her throat for 3 weeks, states it is not really painful just gets to where she has trouble swallowing    Has been having a lump in her throat for about 3 weeks. Thought it was allergies, but doesn't seem to be getting better with benadryl.  Feels like something is stuck right in her throat Has thrown up and having trouble swallowing DYSPHAGIA Duration: 3 weeks Description of symptom: food getting stuck, something feels stuck Onset: Several seconds after swallowing Location of dysphagia: throat Dysphagia to solids only: no Dysphagia to solids & liquids: yes  Frequency:intermittent  Progressively getting worse: yes Alleviatiating factors: vomiting Provoking factors: pillsr and certain foods Status: worse EGD: no Weight loss: no Sensation of lump in throat: yes Heartburn: yes Odynophagia: no Nausea: yes Vomiting: yes Drooling/nasal regurgitation/food spillage: no Coughing/choking/dysphonia: yes Dysarthria: no Hematemesis: no Regurgitation of undigested food/halitosis: yes Chest pain: no  Relevant past medical, surgical, family and social history reviewed and updated as indicated. Interim medical history since our last visit reviewed. Allergies and medications reviewed and updated.  Review of Systems  Constitutional: Negative.   HENT: Positive for sore throat and trouble swallowing. Negative for congestion, dental problem, drooling, ear discharge, ear pain, facial swelling, hearing loss, mouth sores, nosebleeds, postnasal drip, rhinorrhea, sinus pressure, sinus pain, sneezing, tinnitus and voice change.   Respiratory: Negative.   Cardiovascular: Negative.   Gastrointestinal: Positive for  vomiting. Negative for abdominal distention, abdominal pain, anal bleeding, blood in stool, constipation, diarrhea, nausea and rectal pain.  Musculoskeletal: Negative.   Psychiatric/Behavioral: Negative.     Per HPI unless specifically indicated above     Objective:    There were no vitals taken for this visit.  Wt Readings from Last 3 Encounters:  05/11/19 185 lb 6.4 oz (84.1 kg)  03/17/19 181 lb 9.6 oz (82.4 kg)  02/15/19 185 lb 12.8 oz (84.3 kg)    Physical Exam Vitals and nursing note reviewed.  Constitutional:      General: She is not in acute distress.    Appearance: Normal appearance. She is not ill-appearing, toxic-appearing or diaphoretic.  HENT:     Head: Normocephalic and atraumatic.     Right Ear: External ear normal.     Left Ear: External ear normal.     Nose: Nose normal.     Mouth/Throat:     Mouth: Mucous membranes are moist.     Pharynx: Oropharynx is clear.  Eyes:     General: No scleral icterus.       Right eye: No discharge.        Left eye: No discharge.     Conjunctiva/sclera: Conjunctivae normal.     Pupils: Pupils are equal, round, and reactive to light.  Pulmonary:     Effort: Pulmonary effort is normal. No respiratory distress.     Comments: Speaking in full sentences Musculoskeletal:        General: Normal range of motion.     Cervical back: Normal range of motion.  Skin:    Coloration: Skin is not jaundiced or pale.     Findings: No bruising, erythema, lesion or rash.  Neurological:     Mental Status: She is alert and oriented to person, place, and time. Mental status is at baseline.  Psychiatric:        Mood and Affect: Mood normal.        Behavior: Behavior normal.        Thought Content: Thought content normal.        Judgment: Judgment normal.     Results for orders placed or performed in visit on 12/29/18  Novel Coronavirus, NAA ([facility])   Specimen: Oropharyngeal(OP) collection in vial transport medium   OROPHARYNGEA  TESTING   Result Value Ref Range   SARS-CoV-2, NAA Not Detected Not Detected      Assessment & Plan:   Problem List Items Addressed This Visit    None    Visit Diagnoses    Esophageal dysphagia    -  Primary   Will increase her omeprazole and get her into GI. Referral generated today. Call with any concerns. Continue to monitor.    Relevant Orders   Ambulatory referral to Gastroenterology       Follow up plan: Return if symptoms worsen or fail to improve.   . This visit was completed via MyChart due to the restrictions of the COVID-19 pandemic. All issues as above were discussed and addressed. Physical exam was done as above through visual confirmation on MyChart. If it was felt that the patient should be evaluated in the office, they were directed there. The patient verbally consented to this visit. . Location of the patient: home . Location of the provider: home . Those involved with this call:  . Provider: Park Liter, DO . CMA: Tiffany Reel, CMA . Front Desk/Registration: Don Perking  . Time spent on call: 15 minutes with patient face to face via video conference. More than 50% of this time was spent in counseling and coordination of care. 23 minutes total spent in review of patient's record and preparation of their chart.

## 2019-05-13 DIAGNOSIS — F331 Major depressive disorder, recurrent, moderate: Secondary | ICD-10-CM | POA: Diagnosis not present

## 2019-05-13 DIAGNOSIS — G4733 Obstructive sleep apnea (adult) (pediatric): Secondary | ICD-10-CM | POA: Diagnosis not present

## 2019-05-13 DIAGNOSIS — F411 Generalized anxiety disorder: Secondary | ICD-10-CM | POA: Diagnosis not present

## 2019-05-16 ENCOUNTER — Telehealth: Payer: Self-pay

## 2019-05-16 ENCOUNTER — Telehealth: Payer: Self-pay | Admitting: Family Medicine

## 2019-05-16 NOTE — Telephone Encounter (Signed)
Did you work on this

## 2019-05-16 NOTE — Telephone Encounter (Signed)
Spoke with patient.  Explained I would call insurance company early in the morning. However, when I gave the patient the GoodRx option, patient said she would rather do that instead. The price with GoodRx is cheaper than her [employer].  Patient will get the coupon herself. Explained if she needed any assistance with it to give [place] a call.

## 2019-05-16 NOTE — Telephone Encounter (Signed)
Copied from [place] [id number]. Topic: General - Other >> May 16, 2019  9:22 AM Keene Breath wrote: Reason for CRM: Called to inform the doctor that a PA for albuterol (VENTOLIN HFA) 108 (90 Base) MCG/ACT inhaler has been denied.  Please call to discuss at [phone]

## 2019-05-16 NOTE — Telephone Encounter (Signed)
Called patient and left message and asked her to call and schedule new vial allergy injections. Beth

## 2019-05-16 NOTE — Telephone Encounter (Signed)
Yes. Got the determination Friday that [employer] does not manage PA for this. I have to contact the number on patient's insurance card.   Routing back to clinical box as FYI.

## 2019-05-16 NOTE — Telephone Encounter (Signed)
Called number on back insurance card. Was on hold for 10+ minutes. Never got through to representative.  Will try again later.  Looked on GoodRx as well.   Inhaler with GoodRx at [facility] is $28.99. At [facility] is $29.63

## 2019-05-17 ENCOUNTER — Encounter: Payer: Self-pay | Admitting: Gastroenterology

## 2019-05-18 ENCOUNTER — Ambulatory Visit (INDEPENDENT_AMBULATORY_CARE_PROVIDER_SITE_OTHER): Payer: BC Managed Care – PPO | Admitting: Gastroenterology

## 2019-05-18 ENCOUNTER — Ambulatory Visit (INDEPENDENT_AMBULATORY_CARE_PROVIDER_SITE_OTHER): Payer: BC Managed Care – PPO

## 2019-05-18 ENCOUNTER — Encounter: Payer: Self-pay | Admitting: Gastroenterology

## 2019-05-18 ENCOUNTER — Other Ambulatory Visit: Payer: Self-pay

## 2019-05-18 DIAGNOSIS — R1310 Dysphagia, unspecified: Secondary | ICD-10-CM

## 2019-05-18 DIAGNOSIS — J301 Allergic rhinitis due to pollen: Secondary | ICD-10-CM

## 2019-05-18 MED ORDER — EPINEPHRINE 0.3 MG/0.3ML IJ SOAJ: 0.3000 mg | INTRAMUSCULAR | 1 refills | Status: AC | PRN

## 2019-05-18 NOTE — H&P (View-Only) (Signed)
[facility] [place]  [name], [name] [address]  Main: [phone]  Fax: [phone]   Gastroenterology Consultation  Referring Provider:     Valerie Roys, DO Primary Care Physician:  Guadalupe Maple, MD Reason for Consultation:     Dysphagia        HPI:   Virtual Visit via Video Note  I connected with patient on 05/18/19 at  1:15 PM EDT by video ([email or web address]) and verified that I am speaking with the correct person using two identifiers.   I discussed the limitations, risks, security and privacy concerns of performing an evaluation and management service by video and the availability of in person appointments. I also discussed with the patient that there may be a patient responsible charge related to this service. The patient expressed understanding and agreed to proceed.  Location of the patient: Home Location of provider: Home Participating persons: Patient and provider only (Nursing staff checked in patient via phone but were not physically involved in the video interaction - see their notes)   History of Present Illness: Chief Complaint  Patient presents with  . New Patient (Initial Visit)  . Dysphagia    Patient states this anytime swollowing. Patient states there is a lump in throat. Patient has nausea and vomiting. Patient gag all the time     [name] is a 49 y.o. y/o female referred for consultation & management  by Dr. Jeananne Rama, Jeannette How, MD.  Patient reports 3 to 4-week history of dysphagia with no similar symptoms in the past.  Feels both liquids and solids get stuck in her throat.  No weight loss.  No prior EGD.  Has history of reflux and has been on omeprazole 20 mg once daily for 3 to 4 months which relieved her heartburn symptoms which consisted of burning sensation in chest.  When dysphagia symptoms started omeprazole was increased to 40 mg once daily which has not helped her dysphagia.  Reports previous colonoscopy about 3  years ago.  Report not available.  2011 colonoscopy with Dr. Candace Cruise was normal ([facility])  Past Medical History:  Diagnosis Date  . Allergic rhinitis   . Anxiety   . Asthma   . Constipation   . Depression   . Hematuria   . Insomnia   . Reflux     Past Surgical History:  Procedure Laterality Date  . ABDOMINAL HYSTERECTOMY    . APPENDECTOMY    . OVARIAN CYST DRAINAGE Right Nov. 2003  . TONSILLECTOMY    . TUBAL LIGATION      Prior to Admission medications   Medication Sig Start Date End Date Taking? Authorizing Provider  albuterol (VENTOLIN HFA) 108 (90 Base) MCG/ACT inhaler inhale 2 puffs by mouth every 4 to 6 hours 03/21/19  Yes Johnson, [name], DO  buPROPion (WELLBUTRIN XL) 300 MG 24 hr tablet TAKE 1 TABLET(300 MG) BY MOUTH DAILY 04/12/19  Yes Cannady, Jolene T, NP  cetirizine (ZYRTEC) 10 MG tablet Take 10 mg by mouth daily.   Yes [provider]  cyanocobalamin 2000 MCG tablet Take 2,000 mcg by mouth daily.   Yes [provider]  DALIRESP 500 MCG TABS tablet TAKE 1 TABLET BY MOUTH DAILY 04/19/19  Yes Devona Konig A, MD  escitalopram (LEXAPRO) 20 MG tablet TAKE 1 TABLET(20 MG) BY MOUTH DAILY 04/12/19  Yes Cannady, Jolene T, NP  Insulin Syringe-Needle U-100 (INSULIN SYRINGE 1CC/31GX5/16") 31G X 5/16" 1 ML MISC USE FOR ALLERGIES  TWO TIMES A WEEK 04/19/18  Yes Johnson, [name], DO  montelukast (SINGULAIR) 10 MG tablet TAKE 1 TABLET(10 MG) BY MOUTH DAILY 04/19/19  Yes Allyne Gee, MD  omeprazole (PRILOSEC) 40 MG capsule Take 1 capsule (40 mg total) by mouth daily. 05/12/19  Yes Johnson, [name], DO  triamcinolone ointment (KENALOG) 0.1 % APPLY TO THE AFFECTED AREA LEG, ARM, AND TRUNK TWICE DAILY. AVOID USE ON FACE 03/21/19  Yes [provider]  Vitamin D, Ergocalciferol, (DRISDOL) 50000 units CAPS capsule Take 50,000 Units by mouth once a week. 11/04/15  Yes [provider]  zolpidem (AMBIEN) 10 MG tablet Take 1 tablet (10 mg total) by mouth at bedtime as  needed. for sleep 03/17/19  Yes Scarboro, Audie Clear, NP  EPINEPHrine (EPIPEN 2-PAK) 0.3 mg/0.3 mL IJ SOAJ injection Inject 0.3 mLs (0.3 mg total) into the muscle as needed for anaphylaxis. 05/18/19   Kendell Bane, NP  phentermine 37.5 MG capsule Take 1 capsule (37.5 mg total) by mouth every morning. Patient not taking: Reported on 05/17/2019 03/17/19   Kendell Bane, NP    Family History  Problem Relation Age of Onset  . Cancer Mother        ovarian  . Stroke Father   . Cancer Maternal Uncle        lung  . Diabetes Maternal Grandmother   . Emphysema Maternal Grandfather   . Diabetes Paternal Grandmother   . Stroke Paternal Grandfather   . Breast cancer Neg Hx      Social History   Tobacco Use  . Smoking status: Never Smoker  . Smokeless tobacco: Never Used  Substance Use Topics  . Alcohol use: Yes    Comment: occasional  . Drug use: No    Allergies as of 05/18/2019 - Review Complete 05/18/2019  Allergen Reaction Noted  . Penicillins Anaphylaxis 09/29/2014  . Sulfa antibiotics Other (See Comments) 09/29/2014    Review of Systems:    All systems reviewed and negative except where noted in HPI.   Observations/Objective:  Labs: CBC    Component Value Date/Time   WBC 6.2 07/29/2018 1421   WBC 5.6 03/04/2012 1126   RBC 3.74 (L) 07/29/2018 1421   RBC 4.26 03/04/2012 1126   HGB 12.2 07/29/2018 1421   HCT 35.4 07/29/2018 1421   PLT 262 07/29/2018 1421   MCV 95 07/29/2018 1421   MCV 92 03/04/2012 1126   MCH 32.6 07/29/2018 1421   MCH 31.2 03/04/2012 1126   MCHC 34.5 07/29/2018 1421   MCHC 33.8 03/04/2012 1126   RDW 12.7 07/29/2018 1421   RDW 12.9 03/04/2012 1126   LYMPHSABS 2.3 07/29/2018 1421   EOSABS 0.2 07/29/2018 1421   BASOSABS 0.1 07/29/2018 1421   CMP     Component Value Date/Time   NA 138 07/29/2018 1421   NA 138 03/04/2012 1126   K 4.4 07/29/2018 1421   K 4.2 03/04/2012 1126   CL 101 07/29/2018 1421   CL 106 03/04/2012 1126   CO2 23 07/29/2018  1421   CO2 26 03/04/2012 1126   GLUCOSE 97 07/29/2018 1421   GLUCOSE 89 03/04/2012 1126   BUN 13 07/29/2018 1421   BUN 11 03/04/2012 1126   CREATININE 0.69 07/29/2018 1421   CREATININE 0.73 03/04/2012 1126   CALCIUM 9.5 07/29/2018 1421   CALCIUM 9.5 03/04/2012 1126   PROT 6.7 07/29/2018 1421   ALBUMIN 4.6 07/29/2018 1421   AST 17 07/29/2018 1421   ALT 13 07/29/2018  1421   ALKPHOS 64 07/29/2018 1421   BILITOT <0.2 07/29/2018 1421   GFRNONAA 103 07/29/2018 1421   GFRNONAA >60 03/04/2012 1126   GFRAA 118 07/29/2018 1421   GFRAA >60 03/04/2012 1126    Imaging Studies: MM 3D SCREEN BREAST BILATERAL  Result Date: 04/19/2019 CLINICAL DATA:  Screening. EXAM: DIGITAL SCREENING BILATERAL MAMMOGRAM WITH TOMO AND CAD COMPARISON:  Previous exam(s). ACR Breast Density Category b: There are scattered areas of fibroglandular density. FINDINGS: There are no findings suspicious for malignancy. Images were processed with CAD. IMPRESSION: No mammographic evidence of malignancy. A result letter of this screening mammogram will be mailed directly to the patient. RECOMMENDATION: Screening mammogram in one year. (Code:SM-B-01Y) BI-RADS CATEGORY  1: Negative. Electronically Signed   By: Ammie Ferrier M.D.   On: 04/19/2019 09:28    Assessment and Plan:   Ronnae Loretto Villari is a 49 y.o. y/o female has been referred for dysphagia  Assessment and Plan: New onset dysphagia for the last 3 to 4 weeks, with symptoms unrelieved with PPI and heartburn well-controlled with PPI  Proceed with EGD to rule out strictures and EOE  I have discussed alternative options, risks & benefits,  which include, but are not limited to, bleeding, infection, perforation,respiratory complication & drug reaction.  The patient agrees with this plan & written consent will be obtained.     Follow Up Instructions:   I discussed the assessment and treatment plan with the patient. The patient was provided an opportunity to ask  questions and all were answered. The patient agreed with the plan and demonstrated an understanding of the instructions.   The patient was advised to call back or seek an in-person evaluation if the symptoms worsen or if the condition fails to improve as anticipated.  I provided 15 minutes of face-to-face time via video software during this encounter.  Additional time was spent in reviewing patient's chart, placing orders etc.   Virgel Manifold, MD  Speech recognition software was used to dictate the above note.

## 2019-05-18 NOTE — Progress Notes (Signed)
re

## 2019-05-18 NOTE — Progress Notes (Signed)
[name]  [name], [place] [address]  Main: [phone]  Fax: [phone]   Gastroenterology Consultation  Referring Provider:     Valerie Roys, DO Primary Care Physician:  Guadalupe Maple, MD Reason for Consultation:     Dysphagia        HPI:   Virtual Visit via Video Note  I connected with patient on 05/18/19 at  1:15 PM EDT by video ([email or web address]) and verified that I am speaking with the correct person using two identifiers.   I discussed the limitations, risks, security and privacy concerns of performing an evaluation and management service by video and the availability of in person appointments. I also discussed with the patient that there may be a patient responsible charge related to this service. The patient expressed understanding and agreed to proceed.  Location of the patient: Home Location of provider: Home Participating persons: Patient and provider only (Nursing staff checked in patient via phone but were not physically involved in the video interaction - see their notes)   History of Present Illness: Chief Complaint  Patient presents with  . New Patient (Initial Visit)  . Dysphagia    Patient states this anytime swollowing. Patient states there is a lump in throat. Patient has nausea and vomiting. Patient gag all the time     [name] is a 49 y.o. y/o female referred for consultation & management  by Dr. Jeananne Rama, Jeannette How, MD.  Patient reports 3 to 4-week history of dysphagia with no similar symptoms in the past.  Feels both liquids and solids get stuck in her throat.  No weight loss.  No prior EGD.  Has history of reflux and has been on omeprazole 20 mg once daily for 3 to 4 months which relieved her heartburn symptoms which consisted of burning sensation in chest.  When dysphagia symptoms started omeprazole was increased to 40 mg once daily which has not helped her dysphagia.  Reports previous colonoscopy about 3  years ago.  Report not available.  2011 colonoscopy with Dr. Candace Cruise was normal ([facility])  Past Medical History:  Diagnosis Date  . Allergic rhinitis   . Anxiety   . Asthma   . Constipation   . Depression   . Hematuria   . Insomnia   . Reflux     Past Surgical History:  Procedure Laterality Date  . ABDOMINAL HYSTERECTOMY    . APPENDECTOMY    . OVARIAN CYST DRAINAGE Right Nov. 2003  . TONSILLECTOMY    . TUBAL LIGATION      Prior to Admission medications   Medication Sig Start Date End Date Taking? Authorizing Provider  albuterol (VENTOLIN HFA) 108 (90 Base) MCG/ACT inhaler inhale 2 puffs by mouth every 4 to 6 hours 03/21/19  Yes Johnson, [name], DO  buPROPion (WELLBUTRIN XL) 300 MG 24 hr tablet TAKE 1 TABLET(300 MG) BY MOUTH DAILY 04/12/19  Yes Cannady, Jolene T, NP  cetirizine (ZYRTEC) 10 MG tablet Take 10 mg by mouth daily.   Yes [provider]  cyanocobalamin 2000 MCG tablet Take 2,000 mcg by mouth daily.   Yes [provider]  DALIRESP 500 MCG TABS tablet TAKE 1 TABLET BY MOUTH DAILY 04/19/19  Yes Devona Konig A, MD  escitalopram (LEXAPRO) 20 MG tablet TAKE 1 TABLET(20 MG) BY MOUTH DAILY 04/12/19  Yes Cannady, Jolene T, NP  Insulin Syringe-Needle U-100 (INSULIN SYRINGE 1CC/31GX5/16") 31G X 5/16" 1 ML MISC USE FOR ALLERGIES  TWO TIMES A WEEK 04/19/18  Yes Johnson, [name], DO  montelukast (SINGULAIR) 10 MG tablet TAKE 1 TABLET(10 MG) BY MOUTH DAILY 04/19/19  Yes Allyne Gee, MD  omeprazole (PRILOSEC) 40 MG capsule Take 1 capsule (40 mg total) by mouth daily. 05/12/19  Yes Johnson, [name], DO  triamcinolone ointment (KENALOG) 0.1 % APPLY TO THE AFFECTED AREA LEG, ARM, AND TRUNK TWICE DAILY. AVOID USE ON FACE 03/21/19  Yes [provider]  Vitamin D, Ergocalciferol, (DRISDOL) 50000 units CAPS capsule Take 50,000 Units by mouth once a week. 11/04/15  Yes [provider]  zolpidem (AMBIEN) 10 MG tablet Take 1 tablet (10 mg total) by mouth at bedtime as  needed. for sleep 03/17/19  Yes Scarboro, Audie Clear, NP  EPINEPHrine (EPIPEN 2-PAK) 0.3 mg/0.3 mL IJ SOAJ injection Inject 0.3 mLs (0.3 mg total) into the muscle as needed for anaphylaxis. 05/18/19   Kendell Bane, NP  phentermine 37.5 MG capsule Take 1 capsule (37.5 mg total) by mouth every morning. Patient not taking: Reported on 05/17/2019 03/17/19   Kendell Bane, NP    Family History  Problem Relation Age of Onset  . Cancer Mother        ovarian  . Stroke Father   . Cancer Maternal Uncle        lung  . Diabetes Maternal Grandmother   . Emphysema Maternal Grandfather   . Diabetes Paternal Grandmother   . Stroke Paternal Grandfather   . Breast cancer Neg Hx      Social History   Tobacco Use  . Smoking status: Never Smoker  . Smokeless tobacco: Never Used  Substance Use Topics  . Alcohol use: Yes    Comment: occasional  . Drug use: No    Allergies as of 05/18/2019 - Review Complete 05/18/2019  Allergen Reaction Noted  . Penicillins Anaphylaxis 09/29/2014  . Sulfa antibiotics Other (See Comments) 09/29/2014    Review of Systems:    All systems reviewed and negative except where noted in HPI.   Observations/Objective:  Labs: CBC    Component Value Date/Time   WBC 6.2 07/29/2018 1421   WBC 5.6 03/04/2012 1126   RBC 3.74 (L) 07/29/2018 1421   RBC 4.26 03/04/2012 1126   HGB 12.2 07/29/2018 1421   HCT 35.4 07/29/2018 1421   PLT 262 07/29/2018 1421   MCV 95 07/29/2018 1421   MCV 92 03/04/2012 1126   MCH 32.6 07/29/2018 1421   MCH 31.2 03/04/2012 1126   MCHC 34.5 07/29/2018 1421   MCHC 33.8 03/04/2012 1126   RDW 12.7 07/29/2018 1421   RDW 12.9 03/04/2012 1126   LYMPHSABS 2.3 07/29/2018 1421   EOSABS 0.2 07/29/2018 1421   BASOSABS 0.1 07/29/2018 1421   CMP     Component Value Date/Time   NA 138 07/29/2018 1421   NA 138 03/04/2012 1126   K 4.4 07/29/2018 1421   K 4.2 03/04/2012 1126   CL 101 07/29/2018 1421   CL 106 03/04/2012 1126   CO2 23 07/29/2018  1421   CO2 26 03/04/2012 1126   GLUCOSE 97 07/29/2018 1421   GLUCOSE 89 03/04/2012 1126   BUN 13 07/29/2018 1421   BUN 11 03/04/2012 1126   CREATININE 0.69 07/29/2018 1421   CREATININE 0.73 03/04/2012 1126   CALCIUM 9.5 07/29/2018 1421   CALCIUM 9.5 03/04/2012 1126   PROT 6.7 07/29/2018 1421   ALBUMIN 4.6 07/29/2018 1421   AST 17 07/29/2018 1421   ALT 13 07/29/2018  1421   ALKPHOS 64 07/29/2018 1421   BILITOT <0.2 07/29/2018 1421   GFRNONAA 103 07/29/2018 1421   GFRNONAA >60 03/04/2012 1126   GFRAA 118 07/29/2018 1421   GFRAA >60 03/04/2012 1126    Imaging Studies: MM 3D SCREEN BREAST BILATERAL  Result Date: 04/19/2019 CLINICAL DATA:  Screening. EXAM: DIGITAL SCREENING BILATERAL MAMMOGRAM WITH TOMO AND CAD COMPARISON:  Previous exam(s). ACR Breast Density Category b: There are scattered areas of fibroglandular density. FINDINGS: There are no findings suspicious for malignancy. Images were processed with CAD. IMPRESSION: No mammographic evidence of malignancy. A result letter of this screening mammogram will be mailed directly to the patient. RECOMMENDATION: Screening mammogram in one year. (Code:SM-B-01Y) BI-RADS CATEGORY  1: Negative. Electronically Signed   By: Ammie Ferrier M.D.   On: 04/19/2019 09:28    Assessment and Plan:   [name] is a 49 y.o. y/o female has been referred for dysphagia  Assessment and Plan: New onset dysphagia for the last 3 to 4 weeks, with symptoms unrelieved with PPI and heartburn well-controlled with PPI  Proceed with EGD to rule out strictures and EOE  I have discussed alternative options, risks & benefits,  which include, but are not limited to, bleeding, infection, perforation,respiratory complication & drug reaction.  The patient agrees with this plan & written consent will be obtained.     Follow Up Instructions:   I discussed the assessment and treatment plan with the patient. The patient was provided an opportunity to ask  questions and all were answered. The patient agreed with the plan and demonstrated an understanding of the instructions.   The patient was advised to call back or seek an in-person evaluation if the symptoms worsen or if the condition fails to improve as anticipated.  I provided 15 minutes of face-to-face time via video software during this encounter.  Additional time was spent in reviewing patient's chart, placing orders etc.   Virgel Manifold, MD  Speech recognition software was used to dictate the above note.

## 2019-05-19 ENCOUNTER — Encounter: Payer: Self-pay | Admitting: Family Medicine

## 2019-05-19 NOTE — Telephone Encounter (Signed)
On back printer

## 2019-05-23 ENCOUNTER — Other Ambulatory Visit: Payer: Self-pay | Admitting: Adult Health

## 2019-05-23 DIAGNOSIS — J301 Allergic rhinitis due to pollen: Secondary | ICD-10-CM | POA: Diagnosis not present

## 2019-05-23 DIAGNOSIS — F5101 Primary insomnia: Secondary | ICD-10-CM

## 2019-05-23 NOTE — Telephone Encounter (Signed)
Last here 10/20 next 4/21

## 2019-05-25 ENCOUNTER — Other Ambulatory Visit: Payer: Self-pay

## 2019-05-25 DIAGNOSIS — Z6829 Body mass index (BMI) 29.0-29.9, adult: Secondary | ICD-10-CM

## 2019-05-30 MED ORDER — PHENTERMINE HCL 37.5 MG PO CAPS: 37.5000 mg | ORAL_CAPSULE | ORAL | 0 refills | Status: DC

## 2019-06-01 ENCOUNTER — Other Ambulatory Visit: Payer: Self-pay | Admitting: Family Medicine

## 2019-06-01 NOTE — Telephone Encounter (Signed)
Requested Prescriptions  Pending Prescriptions Disp Refills  . albuterol (VENTOLIN HFA) 108 (90 Base) MCG/ACT inhaler [Pharmacy Med Name: ALBUTEROL HFA INH (200 PUFFS)8.5GM] 18 g 1    Sig: INHALE 2 PUFFS BY MOUTH EVERY 4 TO 6 HOURS     Pulmonology:  Beta Agonists Failed - 06/01/2019  3:24 AM      Failed - One inhaler should last at least one month. If the patient is requesting refills earlier, contact the patient to check for uncontrolled symptoms.      Passed - Valid encounter within last 12 months    Recent Outpatient Visits          2 weeks ago Esophageal dysphagia   [place], [name], DO   5 months ago Upper respiratory tract infection, unspecified type   [facility], [name], DO   9 months ago Chest pain, unspecified type   [facility], [name], DO   10 months ago Chest pain, unspecified type   [facility], [name], DO   10 months ago Mild episode of recurrent major depressive disorder [facility]   [place], [place], DO      Future Appointments            In 1 month Tahiliani, Lennette Bihari, MD [place]

## 2019-06-02 ENCOUNTER — Telehealth: Payer: Self-pay

## 2019-06-02 NOTE — Telephone Encounter (Signed)
Patient is calling because she has been having nausea and vomiting constant for a week. Patient states she is unable to keep liquids down. Advised patient to go to the ER to get evaluated because she is really weak and has a headache. Patient states she will go to the ER right now

## 2019-06-10 ENCOUNTER — Other Ambulatory Visit
Admission: RE | Admit: 2019-06-10 | Discharge: 2019-06-10 | Disposition: A | Discharge status: Home / Self Care | Payer: BC Managed Care – PPO | Source: Ambulatory Visit | Attending: Gastroenterology | Admitting: Gastroenterology

## 2019-06-10 ENCOUNTER — Telehealth: Payer: Self-pay

## 2019-06-10 ENCOUNTER — Other Ambulatory Visit: Payer: Self-pay

## 2019-06-10 DIAGNOSIS — Z20822 Contact with and (suspected) exposure to covid-19: Secondary | ICD-10-CM | POA: Diagnosis not present

## 2019-06-10 DIAGNOSIS — Z01812 Encounter for preprocedural laboratory examination: Secondary | ICD-10-CM | POA: Insufficient documentation

## 2019-06-10 LAB — SARS CORONAVIRUS 2 (TAT 6-24 HRS): SARS Coronavirus 2: NEGATIVE

## 2019-06-10 NOTE — Telephone Encounter (Signed)
Confirmed appointment on 06/13/2019 and screened for covid. klh 

## 2019-06-13 DIAGNOSIS — F331 Major depressive disorder, recurrent, moderate: Secondary | ICD-10-CM | POA: Diagnosis not present

## 2019-06-13 DIAGNOSIS — G4733 Obstructive sleep apnea (adult) (pediatric): Secondary | ICD-10-CM | POA: Diagnosis not present

## 2019-06-13 DIAGNOSIS — F411 Generalized anxiety disorder: Secondary | ICD-10-CM | POA: Diagnosis not present

## 2019-06-14 ENCOUNTER — Ambulatory Visit
Admission: RE | Admit: 2019-06-14 | Discharge: 2019-06-14 | Disposition: A | Discharge status: Home / Self Care | Payer: BC Managed Care – PPO | Attending: Gastroenterology | Admitting: Gastroenterology

## 2019-06-14 ENCOUNTER — Ambulatory Visit: Payer: BC Managed Care – PPO | Admitting: Internal Medicine

## 2019-06-14 ENCOUNTER — Encounter
Admission: RE | Disposition: A | Discharge status: Home / Self Care | Payer: Self-pay | Source: Home / Self Care | Attending: Gastroenterology

## 2019-06-14 ENCOUNTER — Ambulatory Visit: Payer: BC Managed Care – PPO | Admitting: Anesthesiology

## 2019-06-14 ENCOUNTER — Encounter: Payer: Self-pay | Admitting: Gastroenterology

## 2019-06-14 ENCOUNTER — Other Ambulatory Visit: Payer: Self-pay

## 2019-06-14 DIAGNOSIS — F329 Major depressive disorder, single episode, unspecified: Secondary | ICD-10-CM | POA: Diagnosis not present

## 2019-06-14 DIAGNOSIS — K222 Esophageal obstruction: Secondary | ICD-10-CM | POA: Diagnosis not present

## 2019-06-14 DIAGNOSIS — G4730 Sleep apnea, unspecified: Secondary | ICD-10-CM | POA: Diagnosis not present

## 2019-06-14 DIAGNOSIS — F419 Anxiety disorder, unspecified: Secondary | ICD-10-CM | POA: Diagnosis not present

## 2019-06-14 DIAGNOSIS — J45909 Unspecified asthma, uncomplicated: Secondary | ICD-10-CM | POA: Insufficient documentation

## 2019-06-14 DIAGNOSIS — R1310 Dysphagia, unspecified: Secondary | ICD-10-CM | POA: Diagnosis not present

## 2019-06-14 DIAGNOSIS — G4700 Insomnia, unspecified: Secondary | ICD-10-CM | POA: Insufficient documentation

## 2019-06-14 DIAGNOSIS — K219 Gastro-esophageal reflux disease without esophagitis: Secondary | ICD-10-CM | POA: Diagnosis not present

## 2019-06-14 DIAGNOSIS — Z880 Allergy status to penicillin: Secondary | ICD-10-CM | POA: Insufficient documentation

## 2019-06-14 DIAGNOSIS — I10 Essential (primary) hypertension: Secondary | ICD-10-CM | POA: Diagnosis not present

## 2019-06-14 DIAGNOSIS — Z79899 Other long term (current) drug therapy: Secondary | ICD-10-CM | POA: Insufficient documentation

## 2019-06-14 DIAGNOSIS — K449 Diaphragmatic hernia without obstruction or gangrene: Secondary | ICD-10-CM | POA: Diagnosis not present

## 2019-06-14 DIAGNOSIS — Z882 Allergy status to sulfonamides status: Secondary | ICD-10-CM | POA: Insufficient documentation

## 2019-06-14 HISTORY — PX: ESOPHAGOGASTRODUODENOSCOPY (EGD) WITH PROPOFOL: SHX5813

## 2019-06-14 HISTORY — DX: Chronic obstructive pulmonary disease, unspecified: J44.9

## 2019-06-14 SURGERY — ESOPHAGOGASTRODUODENOSCOPY (EGD) WITH PROPOFOL
Anesthesia: General

## 2019-06-14 MED ORDER — PROPOFOL 10 MG/ML IV BOLUS
INTRAVENOUS | Status: DC | PRN
  Administered 2019-06-14 (×4): 20 mg via INTRAVENOUS
  Administered 2019-06-14: 80 mg via INTRAVENOUS

## 2019-06-14 MED ORDER — SODIUM CHLORIDE 0.9 % IV SOLN
INTRAVENOUS | Status: DC
  Administered 2019-06-14: 1000 mL via INTRAVENOUS

## 2019-06-14 MED ORDER — LIDOCAINE HCL (CARDIAC) PF 100 MG/5ML IV SOSY
PREFILLED_SYRINGE | INTRAVENOUS | Status: DC | PRN
  Administered 2019-06-14: 100 mg via INTRAVENOUS

## 2019-06-14 NOTE — Anesthesia Postprocedure Evaluation (Signed)
Anesthesia Post Note  Patient: [name]  Procedure(s) Performed: ESOPHAGOGASTRODUODENOSCOPY (EGD) WITH PROPOFOL (N/A )  Patient location during evaluation: Endoscopy Anesthesia Type: General Level of consciousness: awake and alert Pain management: pain level controlled Vital Signs Assessment: post-procedure vital signs reviewed and stable Respiratory status: spontaneous breathing and respiratory function stable Cardiovascular status: stable Anesthetic complications: no     Last Vitals:  Vitals:   06/14/19 0922  BP: 114/81  Pulse: 81  Resp: 20  Temp: (!) 35.9 C  SpO2: 97%    Last Pain:  Vitals:   06/14/19 0922  TempSrc: Tympanic                 Mee Macdonnell K

## 2019-06-14 NOTE — Op Note (Signed)
[facility] Gastroenterology Patient Name: [name] Procedure Date: 06/14/2019 9:51 AM MRN: [id number] Account #: [id number] Date of Birth: 03/25/1970 Admit Type: Outpatient Age: 49 Room: [facility] ENDO ROOM 4 Gender: Female Note Status: Finalized Procedure:             Upper GI endoscopy Indications:           Dysphagia Providers:             Lucilla Lame MD, MD Referring MD:          Guadalupe Maple, MD (Referring MD) Medicines:             Propofol per Anesthesia Complications:         No immediate complications. Procedure:             Pre-Anesthesia Assessment:                        - Prior to the procedure, a History and Physical was                         performed, and patient medications and allergies were                         reviewed. The patient's tolerance of previous                         anesthesia was also reviewed. The risks and benefits                         of the procedure and the sedation options and risks                         were discussed with the patient. All questions were                         answered, and informed consent was obtained. Prior                         Anticoagulants: The patient has taken no previous                         anticoagulant or antiplatelet agents. ASA Grade                         Assessment: II - A patient with mild systemic disease.                         After reviewing the risks and benefits, the patient                         was deemed in satisfactory condition to undergo the                         procedure.                        After obtaining informed consent, the endoscope was  passed under direct vision. Throughout the procedure,                         the patient's blood pressure, pulse, and oxygen                         saturations were monitored continuously. The Endoscope                         was introduced through the mouth, and advanced to the                      second part of duodenum. The upper GI endoscopy was                         accomplished without difficulty. The patient tolerated                         the procedure well. Findings:      A small hiatal hernia was present.      One benign-appearing, intrinsic mild stenosis was found at the       gastroesophageal junction. The stenosis was traversed. A TTS dilator was       passed through the scope. Dilation with a 15-16.5-18 mm balloon dilator       was performed to 18 mm. The dilation site was examined following       endoscope reinsertion and showed complete resolution of luminal       narrowing.      The entire examined stomach was normal.      The examined duodenum was normal.      Biopsies were obtained with cold forceps for histology in the middle       third of the esophagus. Impression:            - Small hiatal hernia.                        - Benign-appearing esophageal stenosis. Dilated.                        - Normal stomach.                        - Normal examined duodenum.                        - Biopsy performed in the middle third of the                         esophagus. Recommendation:        - Discharge patient to home.                        - Resume previous diet.                        - Continue present medications.                        - Await pathology results. Procedure Code(s):     --- Professional ---  [phone], Esophagogastroduodenoscopy, flexible,                         transoral; with transendoscopic balloon dilation of                         esophagus (less than 30 mm diameter)                        43239, 59, Esophagogastroduodenoscopy, flexible,                         transoral; with biopsy, single or multiple Diagnosis Code(s):     --- Professional ---                        R13.10, Dysphagia, unspecified                        K22.2, Esophageal obstruction CPT copyright 2019 American Medical  Association. All rights reserved. The codes documented in this report are preliminary and upon coder review may  be revised to meet current compliance requirements. Lucilla Lame MD, MD 06/14/2019 10:02:28 AM This report has been signed electronically. Number of Addenda: 0 Note Initiated On: 06/14/2019 9:51 AM Estimated Blood Loss:  Estimated blood loss: none.      [facility]

## 2019-06-14 NOTE — Transfer of Care (Signed)
Immediate Anesthesia Transfer of Care Note  Patient: Ethelreda P Hegg  Procedure(s) Performed: ESOPHAGOGASTRODUODENOSCOPY (EGD) WITH PROPOFOL (N/A )  Patient Location: PACU  Anesthesia Type:General  Level of Consciousness: sedated  Airway & Oxygen Therapy: Patient Spontanous Breathing and Patient connected to nasal cannula oxygen  Post-op Assessment: Report given to RN and Post -op Vital signs reviewed and stable  Post vital signs: Reviewed and stable  Last Vitals:  Vitals Value Taken Time  BP    Temp    Pulse 73 06/14/19 1004  Resp    SpO2 95 % 06/14/19 1004  Vitals shown include unvalidated device data.  Last Pain:  Vitals:   06/14/19 0922  TempSrc: Tympanic         Complications: No apparent anesthesia complications

## 2019-06-14 NOTE — Interval H&P Note (Signed)
History and Physical Interval Note:  06/14/2019 9:45 AM  [name]  has presented today for surgery, with the diagnosis of R13.10 Dysphagia.  The various methods of treatment have been discussed with the patient and family. After consideration of risks, benefits and other options for treatment, the patient has consented to  Procedure(s): ESOPHAGOGASTRODUODENOSCOPY (EGD) WITH PROPOFOL (N/A) as a surgical intervention.  The patient's history has been reviewed, patient examined, no change in status, stable for surgery.  I have reviewed the patient's chart and labs.  Questions were answered to the patient's satisfaction.     Ayyub Krall [employer]

## 2019-06-14 NOTE — Anesthesia Preprocedure Evaluation (Signed)
Anesthesia Evaluation  Patient identified by MRN, date of birth, ID band Patient awake    Reviewed: Allergy & Precautions, NPO status , Patient's Chart, lab work & pertinent test results  History of Anesthesia Complications Negative for: history of anesthetic complications  Airway Mallampati: II       Dental   Pulmonary asthma , sleep apnea and Continuous Positive Airway Pressure Ventilation , neg COPD,           Cardiovascular hypertension, Pt. on medications (-) Past MI and (-) CHF (-) dysrhythmias (-) Valvular Problems/Murmurs     Neuro/Psych neg Seizures Anxiety Depression    GI/Hepatic Neg liver ROS, GERD  Medicated,  Endo/Other  neg diabetes  Renal/GU negative Renal ROS     Musculoskeletal   Abdominal   Peds  Hematology   Anesthesia Other Findings   Reproductive/Obstetrics                             Anesthesia Physical Anesthesia Plan  ASA: II  Anesthesia Plan: General   Post-op Pain Management:    Induction: Intravenous  PONV Risk Score and Plan: 3 and Propofol infusion, TIVA and Treatment may vary due to age or medical condition  Airway Management Planned: Nasal Cannula  Additional Equipment:   Intra-op Plan:   Post-operative Plan:   Informed Consent: I have reviewed the patients History and Physical, chart, labs and discussed the procedure including the risks, benefits and alternatives for the proposed anesthesia with the patient or authorized representative who has indicated his/her understanding and acceptance.       Plan Discussed with:   Anesthesia Plan Comments:         Anesthesia Quick Evaluation

## 2019-06-15 ENCOUNTER — Encounter: Payer: Self-pay | Admitting: *Deleted

## 2019-06-15 LAB — SURGICAL PATHOLOGY

## 2019-06-16 ENCOUNTER — Encounter: Payer: Self-pay | Admitting: Gastroenterology

## 2019-06-21 ENCOUNTER — Telehealth: Payer: Self-pay

## 2019-06-21 NOTE — Telephone Encounter (Signed)
Patient rescheduled appointment on 06/23/2019 to 06/28/2019. klh

## 2019-06-23 ENCOUNTER — Ambulatory Visit: Payer: BC Managed Care – PPO | Admitting: Internal Medicine

## 2019-06-24 ENCOUNTER — Telehealth: Payer: Self-pay

## 2019-06-24 NOTE — Telephone Encounter (Signed)
Confirmed appointment on 06/28/2019 and screened for covid. klh

## 2019-06-25 ENCOUNTER — Other Ambulatory Visit: Payer: Self-pay | Admitting: Family Medicine

## 2019-06-28 ENCOUNTER — Encounter: Payer: Self-pay | Admitting: Internal Medicine

## 2019-06-28 ENCOUNTER — Other Ambulatory Visit: Payer: Self-pay

## 2019-06-28 ENCOUNTER — Ambulatory Visit: Payer: BC Managed Care – PPO | Admitting: Internal Medicine

## 2019-06-28 VITALS — BP 137/81 | HR 75 | Temp 97.2°F | Resp 16 | Ht 68.0 in | Wt 187.6 lb

## 2019-06-28 DIAGNOSIS — R0602 Shortness of breath: Secondary | ICD-10-CM

## 2019-06-28 DIAGNOSIS — G4733 Obstructive sleep apnea (adult) (pediatric): Secondary | ICD-10-CM

## 2019-06-28 DIAGNOSIS — F5101 Primary insomnia: Secondary | ICD-10-CM

## 2019-06-28 DIAGNOSIS — J301 Allergic rhinitis due to pollen: Secondary | ICD-10-CM

## 2019-06-28 DIAGNOSIS — R198 Other specified symptoms and signs involving the digestive system and abdomen: Secondary | ICD-10-CM

## 2019-06-28 DIAGNOSIS — Z9989 Dependence on other enabling machines and devices: Secondary | ICD-10-CM

## 2019-06-28 MED ORDER — ZOLPIDEM TARTRATE 10 MG PO TABS: ORAL_TABLET | ORAL | 1 refills | Status: DC

## 2019-06-28 NOTE — Progress Notes (Signed)
[facility] [place], [place] [address]  Pulmonary Sleep Medicine   Office Visit Note  Patient Name: [name] DOB: 16-Feb-1971 MRN [id number]  Date of Service: 06/28/2019  Complaints/HPI: Pt is here for pulmonary follow up.  She has a history of OSA, Allergies and insomnia.  Overall she is doing well.  At our last visit she was having difficulty swallowing and she has since had a small hiatal hernia repaired.     ROS  General: (-) fever, (-) chills, (-) night sweats, (-) weakness Skin: (-) rashes, (-) itching,. Eyes: (-) visual changes, (-) redness, (-) itching. Nose and Sinuses: (-) nasal stuffiness or itchiness, (-) postnasal drip, (-) nosebleeds, (-) sinus trouble. Mouth and Throat: (-) sore throat, (-) hoarseness. Neck: (-) swollen glands, (-) enlarged thyroid, (-) neck pain. Respiratory: - cough, (-) bloody sputum, - shortness of breath, - wheezing. Cardiovascular: - ankle swelling, (-) chest pain. Lymphatic: (-) lymph node enlargement. Neurologic: (-) numbness, (-) tingling. Psychiatric: (-) anxiety, (-) depression   Current Medication: Outpatient Encounter Medications as of 06/28/2019  Medication Sig Note  . albuterol (VENTOLIN HFA) 108 (90 Base) MCG/ACT inhaler INHALE 2 PUFFS BY MOUTH EVERY 4 TO 6 HOURS   . buPROPion (WELLBUTRIN XL) 300 MG 24 hr tablet TAKE 1 TABLET(300 MG) BY MOUTH DAILY   . cetirizine (ZYRTEC) 10 MG tablet Take 10 mg by mouth daily.   . cyanocobalamin 2000 MCG tablet Take 2,000 mcg by mouth daily.   Marland Kitchen DALIRESP 500 MCG TABS tablet TAKE 1 TABLET BY MOUTH DAILY   . EPINEPHrine (EPIPEN 2-PAK) 0.3 mg/0.3 mL IJ SOAJ injection Inject 0.3 mLs (0.3 mg total) into the muscle as needed for anaphylaxis.   Marland Kitchen escitalopram (LEXAPRO) 20 MG tablet TAKE 1 TABLET(20 MG) BY MOUTH DAILY   . Insulin Syringe-Needle U-100 (INSULIN SYRINGE 1CC/31GX5/16") 31G X 5/16" 1 ML MISC USE FOR ALLERGIES TWO TIMES A WEEK   . montelukast (SINGULAIR) 10 MG  tablet TAKE 1 TABLET(10 MG) BY MOUTH DAILY   . omeprazole (PRILOSEC) 40 MG capsule Take 1 capsule (40 mg total) by mouth daily.   . phentermine 37.5 MG capsule Take 1 capsule (37.5 mg total) by mouth every morning.   . triamcinolone ointment (KENALOG) 0.1 % APPLY TO THE AFFECTED AREA LEG, ARM, AND TRUNK TWICE DAILY. AVOID USE ON FACE   . Vitamin D, Ergocalciferol, (DRISDOL) 50000 units CAPS capsule Take 50,000 Units by mouth once a week. 11/28/2015: Received from: External Pharmacy  . zolpidem (AMBIEN) 10 MG tablet TAKE ONE TABLET BY MOUTH EVERY NIGHT AT BEDTIME AS NEEDED FOR SLEEP    No facility-administered encounter medications on file as of 06/28/2019.    Surgical History: Past Surgical History:  Procedure Laterality Date  . ABDOMINAL HYSTERECTOMY    . APPENDECTOMY    . ESOPHAGOGASTRODUODENOSCOPY (EGD) WITH PROPOFOL N/A 06/14/2019   Procedure: ESOPHAGOGASTRODUODENOSCOPY (EGD) WITH PROPOFOL;  Surgeon: Lucilla Lame, MD;  Location: [facility] ENDOSCOPY;  Service: Endoscopy;  Laterality: N/A;  . OVARIAN CYST DRAINAGE Right Nov. 2003  . TONSILLECTOMY    . TUBAL LIGATION      Medical History: Past Medical History:  Diagnosis Date  . Allergic rhinitis   . Anxiety   . Asthma   . Constipation   . COPD (chronic obstructive pulmonary disease) ([place])   . Depression   . Hematuria   . Insomnia   . Reflux     Family History: Family History  Problem Relation Age of Onset  .  Cancer Mother        ovarian  . Stroke Father   . Cancer Maternal Uncle        lung  . Diabetes Maternal Grandmother   . Emphysema Maternal Grandfather   . Diabetes Paternal Grandmother   . Stroke Paternal Grandfather   . Breast cancer Neg Hx     Social History: Social History   Socioeconomic History  . Marital status: Married    Spouse name: Not on file  . Number of children: Not on file  . Years of education: Not on file  . Highest education level: Not on file  Occupational History  . Not on file  Tobacco  Use  . Smoking status: Never Smoker  . Smokeless tobacco: Never Used  Substance and Sexual Activity  . Alcohol use: Yes    Comment: occasional  . Drug use: No  . Sexual activity: Not on file  Other Topics Concern  . Not on file  Social History Narrative  . Not on file   Social Determinants of Health   Financial Resource Strain:   . Difficulty of Paying Living Expenses:   Food Insecurity:   . Worried About Charity fundraiser in the Last Year:   . Arboriculturist in the Last Year:   Transportation Needs:   . Film/video editor (Medical):   Marland Kitchen Lack of Transportation (Non-Medical):   Physical Activity:   . Days of Exercise per Week:   . Minutes of Exercise per Session:   Stress:   . Feeling of Stress :   Social Connections:   . Frequency of Communication with Friends and Family:   . Frequency of Social Gatherings with Friends and Family:   . Attends Religious Services:   . Active Member of Clubs or Organizations:   . Attends Archivist Meetings:   Marland Kitchen Marital Status:   Intimate Partner Violence:   . Fear of Current or Ex-Partner:   . Emotionally Abused:   Marland Kitchen Physically Abused:   . Sexually Abused:     Vital Signs: Blood pressure 137/81, pulse 75, temperature (!) 97.2 F (36.2 C), resp. rate 16, height 5\' 8"  (1.727 m), weight 187 lb 9.6 oz (85.1 kg), SpO2 98 %.  Examination: General Appearance: The patient is well-developed, well-nourished, and in no distress. Skin: Gross inspection of skin unremarkable. Head: normocephalic, no gross deformities. Eyes: no gross deformities noted. ENT: ears appear grossly normal no exudates. Neck: Supple. No thyromegaly. No LAD. Respiratory: clear bilaterally. Cardiovascular: Normal S1 and S2 without murmur or rub. Extremities: No cyanosis. pulses are equal. Neurologic: Alert and oriented. No involuntary movements.  LABS: Recent Results (from the past 2160 hour(s))  SARS CORONAVIRUS 2 (TAT 6-24 HRS) Nasopharyngeal  Nasopharyngeal Swab     Status: None   Collection Time: 06/10/19  9:44 AM   Specimen: Nasopharyngeal Swab  Result Value Ref Range   SARS Coronavirus 2 NEGATIVE NEGATIVE    Comment: (NOTE) SARS-CoV-2 target nucleic acids are NOT DETECTED. The SARS-CoV-2 RNA is generally detectable in upper and lower respiratory specimens during the acute phase of infection. Negative results do not preclude SARS-CoV-2 infection, do not rule out co-infections with other pathogens, and should not be used as the sole basis for treatment or other patient management decisions. Negative results must be combined with clinical observations, patient history, and epidemiological information. The expected result is Negative. Fact Sheet for Patients: SugarRoll.be Fact Sheet for Healthcare Providers: https://www.woods-mathews.com/ This test is not yet  approved or cleared by the [place] and  has been authorized for detection and/or diagnosis of SARS-CoV-2 by FDA under an Emergency Use Authorization (EUA). This EUA will remain  in effect (meaning this test can be used) for the duration of the COVID-19 declaration under Section 56 4(b)(1) of the Act, 21 U.S.C. section 360bbb-3(b)(1), unless the authorization is terminated or revoked sooner. Performed at [facility], [place] [address], [place], [place] [address]   Surgical pathology     Status: None   Collection Time: 06/14/19  9:59 AM  Result Value Ref Range   SURGICAL PATHOLOGY      SURGICAL PATHOLOGY CASE: [id number] PATIENT: Veatrice Bevard Surgical Pathology Report     Specimen Submitted: A. Esophagus, mid; cbxs  Clinical History: R13.10 dysphagia.  Esophageal stricture    DIAGNOSIS: A. ESOPHAGUS, MID; COLD BIOPSY: - BENIGN SQUAMOUS MUCOSA WITH MILD ACANTHOSIS. - NO INCREASE IN INTRAEPITHELIAL EOSINOPHILS (LESS THAN 2 PER HPF). - NEGATIVE FOR DYSPLASIA AND MALIGNANCY.  GROSS DESCRIPTION: A.  Labeled: Mid esophagus cold biopsies, rule out EOE Received: In formalin Tissue fragment(s): 3 Size: 0.7 x 0.4 x 0.1 cm Description: Pale white tissue fragments Entirely submitted in 1 cassette.   Final Diagnosis performed by Allena Napoleon, MD.   Electronically signed 06/15/2019 9:10:06AM The electronic signature indicates that the named Attending Pathologist has evaluated the specimen Technical component performed at [facility], [facility], [address] [place], [place] [address] Lab: [phone] Dir: Rush Farmer, MD, MMM  Professional component performe d at [facility], [facility], [place], [place], [place] [address] Lab: [phone] Dir: Dellia Nims. Reuel Derby, MD     Radiology: No results found.  No results found.  No results found.    Assessment and Plan: Patient Active Problem List   Diagnosis Date Noted  . Dysphagia   . Stricture and stenosis of esophagus   . Irritable bowel syndrome with diarrhea 09/04/2018  . Palpitations 08/12/2018  . Hyperlipidemia, mixed 08/02/2018  . Precordial pain 08/02/2018  . Family history of ovarian cancer 03/12/2016  . Vitamin B12 deficiency (non anemic) 11/18/2014  . Constipation   . Asthma   . Allergic rhinitis   . Depression   . Anxiety   . Hematuria   . Insomnia   . GERD without esophagitis     1. OSA on CPAP Continue to use cpap nightly as directed   2. Seasonal allergic rhinitis due to pollen Continue to take medications as direction.  3. Primary insomnia Reviewed risks and possible side effects associated with taking opiates, benzodiazepines and other CNS depressants. Combination of these could cause dizziness and drowsiness. Advised patient not to drive or operate machinery when taking these medications, as patient's and other's life can be at risk and will have consequences. Patient verbalized understanding in this matter. Dependence and abuse for these drugs will be monitored closely. A Controlled substance  policy and procedure is on file which allows [facility] to order a urine drug screen test at any visit. Patient understands and agrees with the plan - zolpidem (AMBIEN) 10 MG tablet; TAKE ONE TABLET BY MOUTH EVERY NIGHT AT BEDTIME AS NEEDED FOR SLEEP  Dispense: 90 tablet; Refill: 1  4. SOB (shortness of breath) - Spirometry with Graph  5. Difficulty swallowing pills Pt had small hiatal hernia, and had balloon  General Counseling: I have discussed the findings of the evaluation and examination with [place].  I have also discussed any further diagnostic evaluation thatmay be needed or  ordered today. Cleone verbalizes understanding of the findings of todays visit. We also reviewed her medications today and discussed drug interactions and side effects including but not limited excessive drowsiness and altered mental states. We also discussed that there is always a risk not just to her but also people around her. she has been encouraged to call the office with any questions or concerns that should arise related to todays visit.  Orders Placed This Encounter  Procedures  . Spirometry with Graph    Order Specific Question:   Where should this test be performed?    Answer:   [facility]     Time spent: 30 This patient was seen by Orson Gear AGNP-C in Collaboration with Dr. Devona Konig as a part of collaborative care agreement.   I have personally obtained a history, examined the patient, evaluated laboratory and imaging results, formulated the assessment and plan and placed orders.    Allyne Gee, MD [facility] Pulmonary and Critical Care Sleep medicine

## 2019-07-03 ENCOUNTER — Other Ambulatory Visit: Payer: Self-pay | Admitting: Family Medicine

## 2019-07-03 NOTE — Telephone Encounter (Signed)
Requested Prescriptions  Pending Prescriptions Disp Refills  . albuterol (VENTOLIN HFA) 108 (90 Base) MCG/ACT inhaler [Pharmacy Med Name: ALBUTEROL HFA INH (200 PUFFS)8.5GM] 18 g     Sig: INHALE 2 PUFFS BY MOUTH EVERY 4 TO 6 HOURS     Pulmonology:  Beta Agonists Failed - 07/03/2019  3:26 AM      Failed - One inhaler should last at least one month. If the patient is requesting refills earlier, contact the patient to check for uncontrolled symptoms.      Passed - Valid encounter within last 12 months    Recent Outpatient Visits          1 month ago Esophageal dysphagia   [place], [name], DO   6 months ago Upper respiratory tract infection, unspecified type   [facility], [name], DO   10 months ago Chest pain, unspecified type   [facility], [name], DO   11 months ago Chest pain, unspecified type   [facility], [name], DO   11 months ago Mild episode of recurrent major depressive disorder [facility])   [place], [place], DO      Future Appointments            In 3 days Virgel Manifold, MD [place]

## 2019-07-06 ENCOUNTER — Encounter: Payer: Self-pay | Admitting: Gastroenterology

## 2019-07-06 ENCOUNTER — Ambulatory Visit: Payer: BC Managed Care – PPO | Admitting: Gastroenterology

## 2019-07-06 ENCOUNTER — Other Ambulatory Visit: Payer: Self-pay

## 2019-07-06 VITALS — BP 131/81 | HR 86 | Temp 98.4°F | Ht 68.0 in | Wt 190.2 lb

## 2019-07-06 DIAGNOSIS — R198 Other specified symptoms and signs involving the digestive system and abdomen: Secondary | ICD-10-CM

## 2019-07-06 DIAGNOSIS — R0989 Other specified symptoms and signs involving the circulatory and respiratory systems: Secondary | ICD-10-CM | POA: Diagnosis not present

## 2019-07-06 MED ORDER — OMEPRAZOLE 40 MG PO CPDR: 40.0000 mg | DELAYED_RELEASE_CAPSULE | Freq: Two times a day (BID) | ORAL | 0 refills | Status: DC

## 2019-07-07 NOTE — Progress Notes (Signed)
Kayla Antigua, MD [address]  [place], [place] [address]  Main: [phone]  Fax: [phone]   Primary Care Physician: Guadalupe Maple, MD   Chief Complaint  Patient presents with  . Irritable Bowel Syndrome    Patient stated that she she continues to have some diarrhea at times.  Marland Kitchen Dysphagia    Patient stated that she has some trouble swallowing certain foods and has some irritation on her esophagus.    HPI: [name] is a 49 y.o. female here for follow-up of dysphagia, EGD showing mild intrinsic stenosis that was evaluated by Dr. Allen Norris..  This has led to a slight difference according to the patient but she continues to report a globus sensation.  No dysphagia, that is no symptoms where food or liquids get stuck in her esophagus.  No abdominal pain.  No nausea or vomiting.  Current Outpatient Medications  Medication Sig Dispense Refill  . albuterol (VENTOLIN HFA) 108 (90 Base) MCG/ACT inhaler INHALE 2 PUFFS BY MOUTH EVERY 4 TO 6 HOURS 18 g 2  . buPROPion (WELLBUTRIN XL) 300 MG 24 hr tablet TAKE 1 TABLET(300 MG) BY MOUTH DAILY 90 tablet 0  . cetirizine (ZYRTEC) 10 MG tablet Take 10 mg by mouth daily.    . cyanocobalamin 2000 MCG tablet Take 2,000 mcg by mouth daily.    Marland Kitchen DALIRESP 500 MCG TABS tablet TAKE 1 TABLET BY MOUTH DAILY 90 tablet 1  . EPINEPHrine (EPIPEN 2-PAK) 0.3 mg/0.3 mL IJ SOAJ injection Inject 0.3 mLs (0.3 mg total) into the muscle as needed for anaphylaxis. 1 each 1  . escitalopram (LEXAPRO) 20 MG tablet TAKE 1 TABLET(20 MG) BY MOUTH DAILY 90 tablet 0  . Insulin Syringe-Needle U-100 (INSULIN SYRINGE 1CC/31GX5/16") 31G X 5/16" 1 ML MISC USE FOR ALLERGIES TWO TIMES A WEEK 100 each 3  . montelukast (SINGULAIR) 10 MG tablet TAKE 1 TABLET(10 MG) BY MOUTH DAILY 90 tablet 3  . omeprazole (PRILOSEC) 40 MG capsule Take 1 capsule (40 mg total) by mouth in the morning and at bedtime. For 30 days. 60 capsule 0  . phentermine 37.5 MG capsule Take 1  capsule (37.5 mg total) by mouth every morning. 30 capsule 0  . triamcinolone ointment (KENALOG) 0.1 % APPLY TO THE AFFECTED AREA LEG, ARM, AND TRUNK TWICE DAILY. AVOID USE ON FACE    . Vitamin D, Ergocalciferol, (DRISDOL) 50000 units CAPS capsule Take 50,000 Units by mouth once a week.  1  . zolpidem (AMBIEN) 10 MG tablet TAKE ONE TABLET BY MOUTH EVERY NIGHT AT BEDTIME AS NEEDED FOR SLEEP 90 tablet 1   No current facility-administered medications for this visit.    Allergies as of 07/06/2019 - Review Complete 07/06/2019  Allergen Reaction Noted  . Penicillins Anaphylaxis 09/29/2014  . Sulfa antibiotics Other (See Comments) 09/29/2014    ROS:  General: Negative for anorexia, weight loss, fever, chills, fatigue, weakness. ENT: Negative for hoarseness, difficulty swallowing , nasal congestion. CV: Negative for chest pain, angina, palpitations, dyspnea on exertion, peripheral edema.  Respiratory: Negative for dyspnea at rest, dyspnea on exertion, cough, sputum, wheezing.  GI: See history of present illness. GU:  Negative for dysuria, hematuria, urinary incontinence, urinary frequency, nocturnal urination.  Endo: Negative for unusual weight change.    Physical Examination:   BP 131/81   Pulse 86   Temp 98.4 F (36.9 C) (Oral)   Ht 5\' 8"  (1.727 m)   Wt 190 lb 3.2 oz (86.3 kg)  BMI 28.92 kg/m   General: Well-nourished, well-developed in no acute distress.  Eyes: No icterus. Conjunctivae pink. Mouth: Oropharyngeal mucosa moist and pink , no lesions erythema or exudate. Neck: Supple, Trachea midline Abdomen: Bowel sounds are normal, nontender, nondistended, no hepatosplenomegaly or masses, no abdominal bruits or hernia , no rebound or guarding.   Extremities: No lower extremity edema. No clubbing or deformities. Neuro: Alert and oriented x 3.  Grossly intact. Skin: Warm and dry, no jaundice.   Psych: Alert and cooperative, normal mood and affect.   Labs: CMP     Component  Value Date/Time   NA 138 07/29/2018 1421   NA 138 03/04/2012 1126   K 4.4 07/29/2018 1421   K 4.2 03/04/2012 1126   CL 101 07/29/2018 1421   CL 106 03/04/2012 1126   CO2 23 07/29/2018 1421   CO2 26 03/04/2012 1126   GLUCOSE 97 07/29/2018 1421   GLUCOSE 89 03/04/2012 1126   BUN 13 07/29/2018 1421   BUN 11 03/04/2012 1126   CREATININE 0.69 07/29/2018 1421   CREATININE 0.73 03/04/2012 1126   CALCIUM 9.5 07/29/2018 1421   CALCIUM 9.5 03/04/2012 1126   PROT 6.7 07/29/2018 1421   ALBUMIN 4.6 07/29/2018 1421   AST 17 07/29/2018 1421   ALT 13 07/29/2018 1421   ALKPHOS 64 07/29/2018 1421   BILITOT <0.2 07/29/2018 1421   GFRNONAA 103 07/29/2018 1421   GFRNONAA >60 03/04/2012 1126   GFRAA 118 07/29/2018 1421   GFRAA >60 03/04/2012 1126   Lab Results  Component Value Date   WBC 6.2 07/29/2018   HGB 12.2 07/29/2018   HCT 35.4 07/29/2018   MCV 95 07/29/2018   PLT 262 07/29/2018    Imaging Studies: No results found.  Assessment and Plan:   [name] is a 49 y.o. y/o female here for assessment of globus sensation  Increase PPI to twice a day  (Risks of PPI use were discussed with patient including bone loss, C. Diff diarrhea, pneumonia, infections, CKD, electrolyte abnormalities.  Pt. Verbalizes understanding and chooses to continue the medication.)  Patient educated extensively on acid reflux lifestyle modification, including buying a bed wedge, not eating 3 hrs before bedtime, diet modifications, and handout given for the same.   If symptoms do not improve,  consider pH monitoring study    Dr [name]

## 2019-07-09 ENCOUNTER — Other Ambulatory Visit: Payer: Self-pay | Admitting: Nurse Practitioner

## 2019-07-09 NOTE — Telephone Encounter (Signed)
Requested Prescriptions  Pending Prescriptions Disp Refills  . escitalopram (LEXAPRO) 20 MG tablet [Pharmacy Med Name: ESCITALOPRAM 20MG  TABLETS] 90 tablet 1    Sig: TAKE 1 TABLET(20 MG) BY MOUTH DAILY     Psychiatry:  Antidepressants - SSRI Passed - 07/09/2019  8:21 AM      Passed - Completed PHQ-2 or PHQ-9 in the last 360 days.      Passed - Valid encounter within last 6 months    Recent Outpatient Visits          1 month ago Esophageal dysphagia   [place], [name], DO   6 months ago Upper respiratory tract infection, unspecified type   [facility], [name], DO   10 months ago Chest pain, unspecified type   [facility], [name], DO   11 months ago Chest pain, unspecified type   [facility], [name], DO   11 months ago Mild episode of recurrent major depressive disorder [facility])   [place], [place], DO      Future Appointments            In 2 months Tahiliani, Lennette Bihari, MD [place]

## 2019-07-12 ENCOUNTER — Telehealth: Payer: Self-pay | Admitting: Family Medicine

## 2019-07-12 DIAGNOSIS — R1319 Other dysphagia: Secondary | ICD-10-CM

## 2019-07-12 DIAGNOSIS — E010 Iodine-deficiency related diffuse (endemic) goiter: Secondary | ICD-10-CM

## 2019-07-12 NOTE — Telephone Encounter (Signed)
Patient would like to have the ultrasound, she is still having issues swallowing.

## 2019-07-12 NOTE — Telephone Encounter (Signed)
Copied from [place] [id number]. Topic: Referral - Request for Referral >> Jul 12, 2019 11:43 AM Keene Breath wrote: Has patient seen PCP for this complaint?yes *If NO, is insurance requiring patient see PCP for this issue before PCP can refer them? Referral for which specialty: Thyroid Preferred provider/office: Doctor's preference Reason for referral: Patient would like an ultrasound of thyroid which she discussed with doctor previously

## 2019-07-12 NOTE — Telephone Encounter (Signed)
Order for the ultrasound is in. They should be calling her shortly.

## 2019-07-12 NOTE — Telephone Encounter (Signed)
Does she want the ultrasound of her thyroid or does she want to go see endocrinology?

## 2019-07-13 DIAGNOSIS — F411 Generalized anxiety disorder: Secondary | ICD-10-CM | POA: Diagnosis not present

## 2019-07-13 DIAGNOSIS — G4733 Obstructive sleep apnea (adult) (pediatric): Secondary | ICD-10-CM | POA: Diagnosis not present

## 2019-07-13 DIAGNOSIS — F331 Major depressive disorder, recurrent, moderate: Secondary | ICD-10-CM | POA: Diagnosis not present

## 2019-07-14 ENCOUNTER — Ambulatory Visit: Payer: BC Managed Care – PPO | Admitting: Family Medicine

## 2019-07-14 ENCOUNTER — Encounter: Payer: Self-pay | Admitting: Family Medicine

## 2019-07-14 ENCOUNTER — Telehealth: Payer: Self-pay | Admitting: Family Medicine

## 2019-07-14 ENCOUNTER — Other Ambulatory Visit: Payer: Self-pay

## 2019-07-14 VITALS — BP 122/80 | HR 71 | Temp 98.3°F

## 2019-07-14 DIAGNOSIS — R1319 Other dysphagia: Secondary | ICD-10-CM

## 2019-07-14 DIAGNOSIS — R1310 Dysphagia, unspecified: Secondary | ICD-10-CM

## 2019-07-14 NOTE — Progress Notes (Signed)
BP 122/80 (BP Location: Left Arm, Patient Position: Sitting, Cuff Size: Normal)   Pulse 71   Temp 98.3 F (36.8 C) (Oral)   SpO2 97%    Subjective:    Patient ID: [name], female    DOB: March 11, 1970, 49 y.o.   MRN: [id number]  HPI: [name] is a 49 y.o. female  Chief Complaint  Patient presents with  . lump in throat  . Dysphagia   [name] presents today concerned about her swallowing and the lump in her throat. She went back to for her follow up after her endoscopy and she has been on 2 omeprazole daily. She notes that her EGD helped for a couple of days, but she is feeling terrible. Still gagging and throwing up. She notes that things continue to feel very tight and she is still having trouble with food getting caught. She notes that she has discomfort in her throat as well. She is concerned that there could be something going on with her thyroid. She is otherwise feeling well with no other concerns or complaints at this time.   Relevant past medical, surgical, family and social history reviewed and updated as indicated. Interim medical history since our last visit reviewed. Allergies and medications reviewed and updated.  Review of Systems  Constitutional: Negative.   HENT: Positive for sore throat, trouble swallowing and voice change. Negative for congestion, dental problem, drooling, ear discharge, ear pain, facial swelling, hearing loss, mouth sores, nosebleeds, postnasal drip, rhinorrhea, sinus pressure, sinus pain, sneezing and tinnitus.   Respiratory: Negative.   Cardiovascular: Negative.   Musculoskeletal: Negative.   Psychiatric/Behavioral: Negative.     Per HPI unless specifically indicated above     Objective:    BP 122/80 (BP Location: Left Arm, Patient Position: Sitting, Cuff Size: Normal)   Pulse 71   Temp 98.3 F (36.8 C) (Oral)   SpO2 97%   Wt Readings from Last 3 Encounters:  07/06/19 190 lb 3.2 oz (86.3 kg)  06/28/19 187 lb 9.6 oz (85.1 kg)    06/14/19 180 lb (81.6 kg)    Physical Exam Vitals and nursing note reviewed.  Constitutional:      General: She is not in acute distress.    Appearance: Normal appearance. She is not ill-appearing, toxic-appearing or diaphoretic.  HENT:     Head: Normocephalic and atraumatic.     Right Ear: External ear normal.     Left Ear: External ear normal.     Nose: Nose normal.     Mouth/Throat:     Mouth: Mucous membranes are moist.     Pharynx: Oropharynx is clear.  Eyes:     General: No scleral icterus.       Right eye: No discharge.        Left eye: No discharge.     Extraocular Movements: Extraocular movements intact.     Conjunctiva/sclera: Conjunctivae normal.     Pupils: Pupils are equal, round, and reactive to light.  Cardiovascular:     Rate and Rhythm: Normal rate and regular rhythm.     Pulses: Normal pulses.     Heart sounds: Normal heart sounds. No murmur. No friction rub. No gallop.   Pulmonary:     Effort: Pulmonary effort is normal. No respiratory distress.     Breath sounds: Normal breath sounds. No stridor. No wheezing, rhonchi or rales.  Chest:     Chest wall: No tenderness.  Musculoskeletal:  General: Normal range of motion.     Cervical back: Normal range of motion and neck supple.  Skin:    General: Skin is warm and dry.     Capillary Refill: Capillary refill takes less than 2 seconds.     Coloration: Skin is not jaundiced or pale.     Findings: No bruising, erythema, lesion or rash.  Neurological:     General: No focal deficit present.     Mental Status: She is alert and oriented to person, place, and time. Mental status is at baseline.  Psychiatric:        Mood and Affect: Mood normal.        Behavior: Behavior normal.        Thought Content: Thought content normal.        Judgment: Judgment normal.     Results for orders placed or performed during the hospital encounter of 06/14/19  Surgical pathology  Result Value Ref Range   SURGICAL  PATHOLOGY      SURGICAL PATHOLOGY CASE: [id number] PATIENT: [name] Surgical Pathology Report     Specimen Submitted: A. Esophagus, mid; cbxs  Clinical History: R13.10 dysphagia.  Esophageal stricture    DIAGNOSIS: A. ESOPHAGUS, MID; COLD BIOPSY: - BENIGN SQUAMOUS MUCOSA WITH MILD ACANTHOSIS. - NO INCREASE IN INTRAEPITHELIAL EOSINOPHILS (LESS THAN 2 PER HPF). - NEGATIVE FOR DYSPLASIA AND MALIGNANCY.  GROSS DESCRIPTION: A. Labeled: Mid esophagus cold biopsies, rule out EOE Received: In formalin Tissue fragment(s): 3 Size: 0.7 x 0.4 x 0.1 cm Description: Pale white tissue fragments Entirely submitted in 1 cassette.   Final Diagnosis performed by Allena Napoleon, MD.   Electronically signed 06/15/2019 9:10:06AM The electronic signature indicates that the named Attending Pathologist has evaluated the specimen Technical component performed at [facility], [address] [place], [place] [address] Lab: [phone] Dir: Rush Farmer, MD, MMM  Professional component performe d at [facility], [facility], [place], [place], [place] [address] Lab: [phone] Dir: Dellia Nims. Reuel Derby, MD       Assessment & Plan:   Problem List Items Addressed This Visit      Digestive   Dysphagia - Primary    Will change to dexilant. Start magic mouthwash for comfort. Await thyroid US- already ordered. Continue to follow. Continue to follow with GI. Call with any concerns.           Follow up plan: Return in about 4 weeks (around 08/11/2019).

## 2019-07-15 ENCOUNTER — Telehealth: Payer: Self-pay | Admitting: Family Medicine

## 2019-07-15 MED ORDER — MAGIC MOUTHWASH W/LIDOCAINE: 5.0000 mL | Freq: Three times a day (TID) | ORAL | 3 refills | Status: DC | PRN

## 2019-07-15 MED ORDER — DEXILANT 60 MG PO CPDR: 60.0000 mg | DELAYED_RELEASE_CAPSULE | Freq: Every day | ORAL | 3 refills | Status: DC

## 2019-07-15 NOTE — Telephone Encounter (Signed)
Medication called into [facility].

## 2019-07-15 NOTE — Telephone Encounter (Signed)
Copied from [place] [id number]. Topic: General - Other >> Jul 14, 2019  5:42 PM Jaynie Collins D wrote: Reason for CRM: Patient was seen today and wanted to know if PCP has sent in prescription for new medication.

## 2019-07-15 NOTE — Telephone Encounter (Signed)
Dexilant at the pharmacy- please call in [name]- in the med list as phone in

## 2019-07-17 ENCOUNTER — Encounter: Payer: Self-pay | Admitting: Family Medicine

## 2019-07-17 NOTE — Assessment & Plan Note (Addendum)
Will change to dexilant. Start magic mouthwash for comfort. Await thyroid US- already ordered. Continue to follow. Continue to follow with GI. Call with any concerns.

## 2019-07-18 ENCOUNTER — Ambulatory Visit
Admission: RE | Admit: 2019-07-18 | Discharge: 2019-07-18 | Disposition: A | Discharge status: Home / Self Care | Payer: BC Managed Care – PPO | Source: Ambulatory Visit | Attending: Family Medicine | Admitting: Family Medicine

## 2019-07-18 ENCOUNTER — Other Ambulatory Visit: Payer: Self-pay

## 2019-07-18 DIAGNOSIS — R1310 Dysphagia, unspecified: Secondary | ICD-10-CM | POA: Diagnosis not present

## 2019-07-18 DIAGNOSIS — R1319 Other dysphagia: Secondary | ICD-10-CM

## 2019-07-18 DIAGNOSIS — E010 Iodine-deficiency related diffuse (endemic) goiter: Secondary | ICD-10-CM

## 2019-07-19 ENCOUNTER — Telehealth: Payer: Self-pay | Admitting: Family Medicine

## 2019-07-19 NOTE — Telephone Encounter (Signed)
Copied from [place] [phone]. Topic: General - Other >> Jul 19, 2019 11:39 AM Celene Kras wrote: Reason for CRM: Pt called and is requesting to have pcp nurse give her a call back regarding her lab results from yesterday. Please advise.

## 2019-07-19 NOTE — Telephone Encounter (Signed)
Called and spoke with patient, she would like to know her thyroid ultrasound results.  Patient is also concerned that her fingers appear water logged, without being exposed to water.

## 2019-07-19 NOTE — Telephone Encounter (Signed)
Patient notified

## 2019-07-27 ENCOUNTER — Other Ambulatory Visit: Payer: Self-pay

## 2019-07-27 ENCOUNTER — Encounter: Payer: Self-pay | Admitting: Family Medicine

## 2019-07-27 ENCOUNTER — Ambulatory Visit (INDEPENDENT_AMBULATORY_CARE_PROVIDER_SITE_OTHER): Payer: BC Managed Care – PPO | Admitting: Family Medicine

## 2019-07-27 VITALS — BP 114/73 | HR 72 | Temp 98.2°F | Ht 67.82 in | Wt 186.4 lb

## 2019-07-27 DIAGNOSIS — K219 Gastro-esophageal reflux disease without esophagitis: Secondary | ICD-10-CM | POA: Diagnosis not present

## 2019-07-27 DIAGNOSIS — L309 Dermatitis, unspecified: Secondary | ICD-10-CM

## 2019-07-27 DIAGNOSIS — E782 Mixed hyperlipidemia: Secondary | ICD-10-CM | POA: Diagnosis not present

## 2019-07-27 DIAGNOSIS — K580 Irritable bowel syndrome with diarrhea: Secondary | ICD-10-CM

## 2019-07-27 DIAGNOSIS — M791 Myalgia, unspecified site: Secondary | ICD-10-CM

## 2019-07-27 MED ORDER — MOMETASONE FUROATE 0.1 % EX CREA: 1.0000 "application " | TOPICAL_CREAM | Freq: Every day | CUTANEOUS | 0 refills | Status: DC

## 2019-07-27 NOTE — Progress Notes (Signed)
BP 114/73 (BP Location: Left Arm, Patient Position: Sitting, Cuff Size: Normal)   Pulse 72   Temp 98.2 F (36.8 C) (Oral)   Ht 5' 7.82" (1.723 m)   Wt 186 lb 6.4 oz (84.6 kg)   SpO2 99%   BMI 28.50 kg/m    Subjective:    Patient ID: [name], female    DOB: 08/04/1970, 49 y.o.   MRN: [id number]  HPI: [name] is a 49 y.o. female  Chief Complaint  Patient presents with  . Skin Problem    fingers pruney   Has been having "pruney" fingers for about the past week or so. She has been using lotion and sleeping with gloves on. She feels like her fingers feel raw. She feels very uncomfortable when she puts her hands in water. They were peeling, but then started getting better. He has been using eucerin, cerave, vasaline, lubriderm, has been putting on gloves to do anything, but nothing is getting this better. She also notes that she has had diarrhea and is concerned that this may be contributing to what's going on. She is otherwise feeling better. Her GERD is doing a lot better. No other concerns or complaints at this time.   Relevant past medical, surgical, family and social history reviewed and updated as indicated. Interim medical history since our last visit reviewed. Allergies and medications reviewed and updated.  Review of Systems  Constitutional: Negative.   Respiratory: Negative.   Cardiovascular: Negative.   Gastrointestinal: Negative.   Musculoskeletal: Negative.   Skin: Negative.   Psychiatric/Behavioral: Negative.     Per HPI unless specifically indicated above     Objective:    BP 114/73 (BP Location: Left Arm, Patient Position: Sitting, Cuff Size: Normal)   Pulse 72   Temp 98.2 F (36.8 C) (Oral)   Ht 5' 7.82" (1.723 m)   Wt 186 lb 6.4 oz (84.6 kg)   SpO2 99%   BMI 28.50 kg/m   Wt Readings from Last 3 Encounters:  07/27/19 186 lb 6.4 oz (84.6 kg)  07/06/19 190 lb 3.2 oz (86.3 kg)  06/28/19 187 lb 9.6 oz (85.1 kg)    Physical Exam Vitals and  nursing note reviewed.  Constitutional:      General: She is not in acute distress.    Appearance: Normal appearance. She is not ill-appearing, toxic-appearing or diaphoretic.  HENT:     Head: Normocephalic and atraumatic.     Right Ear: External ear normal.     Left Ear: External ear normal.     Nose: Nose normal.     Mouth/Throat:     Mouth: Mucous membranes are moist.     Pharynx: Oropharynx is clear.  Eyes:     General: No scleral icterus.       Right eye: No discharge.        Left eye: No discharge.     Extraocular Movements: Extraocular movements intact.     Conjunctiva/sclera: Conjunctivae normal.     Pupils: Pupils are equal, round, and reactive to light.  Cardiovascular:     Rate and Rhythm: Normal rate and regular rhythm.     Pulses: Normal pulses.     Heart sounds: Normal heart sounds. No murmur. No friction rub. No gallop.   Pulmonary:     Effort: Pulmonary effort is normal. No respiratory distress.     Breath sounds: Normal breath sounds. No stridor. No wheezing, rhonchi or rales.  Chest:  Chest wall: No tenderness.  Musculoskeletal:        General: Normal range of motion.     Cervical back: Normal range of motion and neck supple.  Skin:    General: Skin is warm and dry.     Capillary Refill: Capillary refill takes less than 2 seconds.     Coloration: Skin is not jaundiced or pale.     Findings: No bruising, erythema, lesion or rash.     Comments: Very dry skins on bilateral palms  Neurological:     General: No focal deficit present.     Mental Status: She is alert and oriented to person, place, and time. Mental status is at baseline.  Psychiatric:        Mood and Affect: Mood normal.        Behavior: Behavior normal.        Thought Content: Thought content normal.        Judgment: Judgment normal.     Results for orders placed or performed in visit on 07/27/19  CBC with Differential/Platelet  Result Value Ref Range   WBC 5.5 3.4 - 10.8 x10E3/uL    RBC 4.07 3.77 - 5.28 x10E6/uL   Hemoglobin 12.9 11.1 - 15.9 g/dL   Hematocrit 39.1 34.0 - 46.6 %   MCV 96 79 - 97 fL   MCH 31.7 26.6 - 33.0 pg   MCHC 33.0 31.5 - 35.7 g/dL   RDW 12.7 11.7 - 15.4 %   Platelets 266 150 - 450 x10E3/uL   Neutrophils 46 Not Estab. %   Lymphs 37 Not Estab. %   Monocytes 8 Not Estab. %   Eos 7 Not Estab. %   Basos 2 Not Estab. %   Neutrophils Absolute 2.6 1.4 - 7.0 x10E3/uL   Lymphocytes Absolute 2.0 0.7 - 3.1 x10E3/uL   Monocytes Absolute 0.4 0.1 - 0.9 x10E3/uL   EOS (ABSOLUTE) 0.4 0.0 - 0.4 x10E3/uL   Basophils Absolute 0.1 0.0 - 0.2 x10E3/uL   Immature Granulocytes 0 Not Estab. %   Immature Grans (Abs) 0.0 0.0 - 0.1 x10E3/uL  Comprehensive metabolic panel  Result Value Ref Range   Glucose 96 65 - 99 mg/dL   BUN 13 6 - 24 mg/dL   Creatinine, Ser 0.73 0.57 - 1.00 mg/dL   GFR calc non Af Amer 96 >59 mL/min/1.73   GFR calc Af Amer 111 >59 mL/min/1.73   BUN/Creatinine Ratio 18 9 - 23   Sodium 139 134 - 144 mmol/L   Potassium 4.3 3.5 - 5.2 mmol/L   Chloride 101 96 - 106 mmol/L   CO2 23 20 - 29 mmol/L   Calcium 9.8 8.7 - 10.2 mg/dL   Total Protein 6.9 6.0 - 8.5 g/dL   Albumin 4.7 3.8 - 4.8 g/dL   Globulin, Total 2.2 1.5 - 4.5 g/dL   Albumin/Globulin Ratio 2.1 1.2 - 2.2   Bilirubin Total <0.2 0.0 - 1.2 mg/dL   Alkaline Phosphatase 70 48 - 121 IU/L   AST 16 0 - 40 IU/L   ALT 12 0 - 32 IU/L  TSH  Result Value Ref Range   TSH 2.370 0.450 - 4.500 uIU/mL  VITAMIN D 25 Hydroxy (Vit-D Deficiency, Fractures)  Result Value Ref Range   Vit D, 25-Hydroxy 43.6 30.0 - 100.0 ng/mL  Lipid Panel w/o Chol/HDL Ratio  Result Value Ref Range   Cholesterol, Total 260 (H) 100 - 199 mg/dL   Triglycerides 270 (H) 0 - 149 mg/dL   HDL  62 >39 mg/dL   VLDL Cholesterol Cal 49 (H) 5 - 40 mg/dL   LDL Chol Calc (NIH) 149 (H) 0 - 99 mg/dL  B12  Result Value Ref Range   Vitamin B-12 1,430 (H) 232 - 1,245 pg/mL      Assessment & Plan:   Problem List Items Addressed  This Visit      Digestive   GERD without esophagitis    Doing much better on her dexilant. Continue to monitor. Call with any concerns.       Relevant Orders   CBC with Differential/Platelet (Completed)   Comprehensive metabolic panel (Completed)   Irritable bowel syndrome with diarrhea    Will check labs today. Await results. Treat as needed. Call with any concerns.       Relevant Orders   Comprehensive metabolic panel (Completed)   TSH (Completed)     Other   Hyperlipidemia, mixed    Checking labs today. Await results. Treat as needed.       Relevant Orders   Comprehensive metabolic panel (Completed)   Lipid Panel w/o Chol/HDL Ratio (Completed)    Other Visit Diagnoses    Hand dermatitis    -  Primary   Will check labs and treat with elecon. If not improving- we will refer to dermatology.    Relevant Orders   Comprehensive metabolic panel (Completed)   B12 (Completed)   Myalgia       Will check labs today. Await results. Treat as needed. Call with any concerns.    Relevant Orders   VITAMIN D 25 Hydroxy (Vit-D Deficiency, Fractures) (Completed)       Follow up plan: Return if symptoms worsen or fail to improve.

## 2019-07-28 LAB — VITAMIN B12: Vitamin B-12: 1430 pg/mL — ABNORMAL HIGH (ref 232–1245)

## 2019-07-28 LAB — CBC WITH DIFFERENTIAL/PLATELET
Basophils Absolute: 0.1 10*3/uL (ref 0.0–0.2)
Basos: 2 %
EOS (ABSOLUTE): 0.4 10*3/uL (ref 0.0–0.4)
Eos: 7 %
Hematocrit: 39.1 % (ref 34.0–46.6)
Hemoglobin: 12.9 g/dL (ref 11.1–15.9)
Immature Grans (Abs): 0 10*3/uL (ref 0.0–0.1)
Immature Granulocytes: 0 %
Lymphocytes Absolute: 2 10*3/uL (ref 0.7–3.1)
Lymphs: 37 %
MCH: 31.7 pg (ref 26.6–33.0)
MCHC: 33 g/dL (ref 31.5–35.7)
MCV: 96 fL (ref 79–97)
Monocytes Absolute: 0.4 10*3/uL (ref 0.1–0.9)
Monocytes: 8 %
Neutrophils Absolute: 2.6 10*3/uL (ref 1.4–7.0)
Neutrophils: 46 %
Platelets: 266 10*3/uL (ref 150–450)
RBC: 4.07 x10E6/uL (ref 3.77–5.28)
RDW: 12.7 % (ref 11.7–15.4)
WBC: 5.5 10*3/uL (ref 3.4–10.8)

## 2019-07-28 LAB — LIPID PANEL W/O CHOL/HDL RATIO
Cholesterol, Total: 260 mg/dL — ABNORMAL HIGH (ref 100–199)
HDL: 62 mg/dL (ref >39)
LDL Chol Calc (NIH): 149 mg/dL — ABNORMAL HIGH (ref 0–99)
Triglycerides: 270 mg/dL — ABNORMAL HIGH (ref 0–149)
VLDL Cholesterol Cal: 49 mg/dL — ABNORMAL HIGH (ref 5–40)

## 2019-07-28 LAB — COMPREHENSIVE METABOLIC PANEL
ALT: 12 IU/L (ref 0–32)
AST: 16 IU/L (ref 0–40)
Albumin/Globulin Ratio: 2.1 (ref 1.2–2.2)
Albumin: 4.7 g/dL (ref 3.8–4.8)
Alkaline Phosphatase: 70 IU/L (ref 48–121)
BUN/Creatinine Ratio: 18 (ref 9–23)
BUN: 13 mg/dL (ref 6–24)
Bilirubin Total: <0.2 mg/dL (ref 0.0–1.2)
CO2: 23 mmol/L (ref 20–29)
Calcium: 9.8 mg/dL (ref 8.7–10.2)
Chloride: 101 mmol/L (ref 96–106)
Creatinine, Ser: 0.73 mg/dL (ref 0.57–1.00)
GFR calc Af Amer: 111 mL/min/{1.73_m2} (ref >59)
GFR calc non Af Amer: 96 mL/min/{1.73_m2} (ref >59)
Globulin, Total: 2.2 g/dL (ref 1.5–4.5)
Glucose: 96 mg/dL (ref 65–99)
Potassium: 4.3 mmol/L (ref 3.5–5.2)
Sodium: 139 mmol/L (ref 134–144)
Total Protein: 6.9 g/dL (ref 6.0–8.5)

## 2019-07-28 LAB — TSH: TSH: 2.37 u[IU]/mL (ref 0.450–4.500)

## 2019-07-28 LAB — VITAMIN D 25 HYDROXY (VIT D DEFICIENCY, FRACTURES): Vit D, 25-Hydroxy: 43.6 ng/mL (ref 30.0–100.0)

## 2019-07-31 ENCOUNTER — Encounter: Payer: Self-pay | Admitting: Family Medicine

## 2019-07-31 NOTE — Assessment & Plan Note (Signed)
Will check labs today. Await results. Treat as needed. Call with any concerns.

## 2019-07-31 NOTE — Assessment & Plan Note (Signed)
Checking labs today. Await results. Treat as needed.  

## 2019-07-31 NOTE — Assessment & Plan Note (Signed)
Doing much better on her dexilant. Continue to monitor. Call with any concerns.

## 2019-08-02 ENCOUNTER — Encounter: Payer: Self-pay | Admitting: Family Medicine

## 2019-08-13 DIAGNOSIS — F411 Generalized anxiety disorder: Secondary | ICD-10-CM | POA: Diagnosis not present

## 2019-08-13 DIAGNOSIS — F331 Major depressive disorder, recurrent, moderate: Secondary | ICD-10-CM | POA: Diagnosis not present

## 2019-08-13 DIAGNOSIS — G4733 Obstructive sleep apnea (adult) (pediatric): Secondary | ICD-10-CM | POA: Diagnosis not present

## 2019-08-25 ENCOUNTER — Other Ambulatory Visit: Payer: Self-pay | Admitting: Family Medicine

## 2019-08-25 NOTE — Telephone Encounter (Signed)
Requested  medications are  due for refill today Too early  Requested medications are on the active medication list yes  Last refill 6/14  Future visit scheduled no  Notes to clinic Asking for refill too soon, unsure if having complications. (It is 4am, I can not call to ask)

## 2019-09-26 ENCOUNTER — Ambulatory Visit: Payer: BC Managed Care – PPO | Admitting: Gastroenterology

## 2019-10-18 ENCOUNTER — Other Ambulatory Visit: Payer: Self-pay | Admitting: Family Medicine

## 2019-10-18 NOTE — Telephone Encounter (Signed)
Requested Prescriptions  Pending Prescriptions Disp Refills   albuterol (VENTOLIN HFA) 108 (90 Base) MCG/ACT inhaler [Pharmacy Med Name: ALBUTEROL HFA INH (200 PUFFS)8.5GM] 8.5 g 2    Sig: INHALE 2 PUFFS BY MOUTH EVERY 4 TO 6 HOURS     Pulmonology:  Beta Agonists Failed - 10/18/2019  3:20 AM      Failed - One inhaler should last at least one month. If the patient is requesting refills earlier, contact the patient to check for uncontrolled symptoms.      Passed - Valid encounter within last 12 months    Recent Outpatient Visits          2 months ago Hand dermatitis   [place], [place], DO   3 months ago Esophageal dysphagia   [place], [place], DO   5 months ago Esophageal dysphagia   [place], [name], DO   9 months ago Upper respiratory tract infection, unspecified type   [place], DO   1 year ago Chest pain, unspecified type   [facility], [place], DO

## 2019-10-27 ENCOUNTER — Ambulatory Visit: Payer: BC Managed Care – PPO

## 2019-10-27 ENCOUNTER — Other Ambulatory Visit: Payer: Self-pay

## 2019-10-27 DIAGNOSIS — J301 Allergic rhinitis due to pollen: Secondary | ICD-10-CM | POA: Diagnosis not present

## 2019-10-31 DIAGNOSIS — J301 Allergic rhinitis due to pollen: Secondary | ICD-10-CM | POA: Diagnosis not present

## 2019-11-07 ENCOUNTER — Other Ambulatory Visit: Payer: Self-pay | Admitting: Family Medicine

## 2019-11-16 DIAGNOSIS — F411 Generalized anxiety disorder: Secondary | ICD-10-CM | POA: Diagnosis not present

## 2019-11-16 DIAGNOSIS — G4733 Obstructive sleep apnea (adult) (pediatric): Secondary | ICD-10-CM | POA: Diagnosis not present

## 2019-11-16 DIAGNOSIS — F331 Major depressive disorder, recurrent, moderate: Secondary | ICD-10-CM | POA: Diagnosis not present

## 2019-11-22 DIAGNOSIS — L7211 Pilar cyst: Secondary | ICD-10-CM | POA: Diagnosis not present

## 2019-11-22 DIAGNOSIS — D492 Neoplasm of unspecified behavior of bone, soft tissue, and skin: Secondary | ICD-10-CM | POA: Diagnosis not present

## 2019-12-05 ENCOUNTER — Other Ambulatory Visit: Payer: Self-pay | Admitting: Nurse Practitioner

## 2019-12-07 ENCOUNTER — Other Ambulatory Visit: Payer: Self-pay | Admitting: Family Medicine

## 2019-12-07 NOTE — Telephone Encounter (Signed)
Patient last seen in June

## 2019-12-07 NOTE — Telephone Encounter (Signed)
Requested medication (s) are due for refill today: yes  Requested medication (s) are on the active medication list: yes  Last refill:  07/15/19  #30 3 refills  Future visit scheduled: No  Notes to clinic:  Warning appears Patient also ordered Omeprazole?    Requested Prescriptions  Pending Prescriptions Disp Refills   dexlansoprazole (DEXILANT) 60 MG capsule 90 capsule 1    Sig: Take 1 capsule (60 mg total) by mouth daily.      Gastroenterology: Proton Pump Inhibitors Passed - 12/07/2019 12:42 PM      Passed - Valid encounter within last 12 months    Recent Outpatient Visits           4 months ago Hand dermatitis   [place], [name], DO   4 months ago Esophageal dysphagia   [place], [place], DO   6 months ago Esophageal dysphagia   [place], [name], DO   11 months ago Upper respiratory tract infection, unspecified type   [place], DO   1 year ago Chest pain, unspecified type   [facility], [place], DO

## 2019-12-07 NOTE — Telephone Encounter (Signed)
Pt is calling and has contacted her pharm. Pt need a refill on dexilant 60 mg. [facility] in [place] on [address]

## 2019-12-08 ENCOUNTER — Other Ambulatory Visit: Payer: Self-pay | Admitting: Nurse Practitioner

## 2019-12-11 MED ORDER — DEXILANT 60 MG PO CPDR: 60.0000 mg | DELAYED_RELEASE_CAPSULE | Freq: Every day | ORAL | 1 refills | Status: DC

## 2019-12-22 ENCOUNTER — Other Ambulatory Visit: Payer: Self-pay

## 2019-12-22 DIAGNOSIS — F5101 Primary insomnia: Secondary | ICD-10-CM

## 2019-12-22 MED ORDER — ZOLPIDEM TARTRATE 10 MG PO TABS: ORAL_TABLET | ORAL | 0 refills | Status: DC

## 2019-12-22 NOTE — Telephone Encounter (Signed)
7 tablets of Ambien sent to pharmacy to last until next follow-up appointment.

## 2019-12-29 ENCOUNTER — Encounter: Payer: Self-pay | Admitting: Internal Medicine

## 2019-12-29 ENCOUNTER — Other Ambulatory Visit: Payer: Self-pay

## 2019-12-29 ENCOUNTER — Ambulatory Visit: Payer: BC Managed Care – PPO | Admitting: Internal Medicine

## 2019-12-29 VITALS — BP 114/72 | HR 82 | Temp 98.4°F | Resp 16 | Ht 68.0 in | Wt 189.6 lb

## 2019-12-29 DIAGNOSIS — G4733 Obstructive sleep apnea (adult) (pediatric): Secondary | ICD-10-CM | POA: Diagnosis not present

## 2019-12-29 DIAGNOSIS — K219 Gastro-esophageal reflux disease without esophagitis: Secondary | ICD-10-CM | POA: Diagnosis not present

## 2019-12-29 DIAGNOSIS — J301 Allergic rhinitis due to pollen: Secondary | ICD-10-CM | POA: Diagnosis not present

## 2019-12-29 DIAGNOSIS — R0602 Shortness of breath: Secondary | ICD-10-CM

## 2019-12-29 DIAGNOSIS — Z9989 Dependence on other enabling machines and devices: Secondary | ICD-10-CM

## 2019-12-29 MED ORDER — ZOLPIDEM TARTRATE 10 MG PO TABS: 10.0000 mg | ORAL_TABLET | Freq: Every evening | ORAL | 3 refills | Status: DC | PRN

## 2019-12-29 NOTE — Patient Instructions (Signed)

## 2019-12-29 NOTE — Progress Notes (Signed)
[facility] [place], [place] [address]  Pulmonary Sleep Medicine   Office Visit Note  Patient Name: [name] DOB: Jan 22, 1971 MRN [id number]  Date of Service: 01/02/2020  Complaints/HPI: Asthma longstanding history of obstructive asthma she has been under rather good control since she has been getting allergy shots.  She also has a history of obstructive sleep apnea she currently is on CPAP.  Her AHI was about 11.7 with her underlying pulmonary disease I think she is gaining great deal of benefit.  As far as her asthma is concerned she has not had any significant exacerbations of her asthma and so therefore would recommend continuing with the allergy shots and current management as she is.  She also does take Ambien to help her sleep and improve her compliance with the CPAP.  ROS  General: (-) fever, (-) chills, (-) night sweats, (-) weakness Skin: (-) rashes, (-) itching,. Eyes: (-) visual changes, (-) redness, (-) itching. Nose and Sinuses: (-) nasal stuffiness or itchiness, (-) postnasal drip, (-) nosebleeds, (-) sinus trouble. Mouth and Throat: (-) sore throat, (-) hoarseness. Neck: (-) swollen glands, (-) enlarged thyroid, (-) neck pain. Respiratory: - cough, (-) bloody sputum, + shortness of breath, - wheezing. Cardiovascular: - ankle swelling, (-) chest pain. Lymphatic: (-) lymph node enlargement. Neurologic: (-) numbness, (-) tingling. Psychiatric: (-) anxiety, (-) depression   Current Medication: Outpatient Encounter Medications as of 12/29/2019  Medication Sig Note  . albuterol (VENTOLIN HFA) 108 (90 Base) MCG/ACT inhaler INHALE 2 PUFFS BY MOUTH EVERY 4 TO 6 HOURS   . buPROPion (WELLBUTRIN XL) 300 MG 24 hr tablet TAKE 1 TABLET(300 MG) BY MOUTH DAILY   . cetirizine (ZYRTEC) 10 MG tablet Take 10 mg by mouth daily.   . cyanocobalamin 2000 MCG tablet Take 2,000 mcg by mouth daily.   Marland Kitchen DALIRESP 500 MCG TABS tablet TAKE 1 TABLET BY MOUTH DAILY   .  dexlansoprazole (DEXILANT) 60 MG capsule Take 1 capsule (60 mg total) by mouth daily.   Marland Kitchen EPINEPHrine (EPIPEN 2-PAK) 0.3 mg/0.3 mL IJ SOAJ injection Inject 0.3 mLs (0.3 mg total) into the muscle as needed for anaphylaxis.   Marland Kitchen escitalopram (LEXAPRO) 20 MG tablet TAKE 1 TABLET(20 MG) BY MOUTH DAILY   . Insulin Syringe-Needle U-100 (INSULIN SYRINGE 1CC/31GX5/16") 31G X 5/16" 1 ML MISC USE FOR ALLERGIES TWO TIMES A WEEK   . magic mouthwash w/lidocaine SOLN Take 5 mLs by mouth 3 (three) times daily as needed (throat pain). Swish and swallow   . mometasone (ELOCON) 0.1 % cream Apply 1 application topically daily.   . montelukast (SINGULAIR) 10 MG tablet TAKE 1 TABLET(10 MG) BY MOUTH DAILY   . omeprazole (PRILOSEC) 40 MG capsule Take 1 capsule (40 mg total) by mouth in the morning and at bedtime. For 30 days.   . phentermine 37.5 MG capsule Take 1 capsule (37.5 mg total) by mouth every morning.   . triamcinolone ointment (KENALOG) 0.1 % APPLY TO THE AFFECTED AREA LEG, ARM, AND TRUNK TWICE DAILY. AVOID USE ON FACE   . Vitamin D, Ergocalciferol, (DRISDOL) 50000 units CAPS capsule Take 50,000 Units by mouth once a week. 11/28/2015: Received from: External Pharmacy  . [DISCONTINUED] zolpidem (AMBIEN) 10 MG tablet TAKE ONE TABLET BY MOUTH EVERY NIGHT AT BEDTIME AS NEEDED FOR SLEEP   . zolpidem (AMBIEN) 10 MG tablet Take 1 tablet (10 mg total) by mouth at bedtime as needed for sleep.   . [DISCONTINUED] zolpidem (AMBIEN) 10 MG  tablet Take 1 tablet (10 mg total) by mouth at bedtime as needed for sleep.    No facility-administered encounter medications on file as of 12/29/2019.    Surgical History: Past Surgical History:  Procedure Laterality Date  . ABDOMINAL HYSTERECTOMY    . APPENDECTOMY    . ESOPHAGOGASTRODUODENOSCOPY (EGD) WITH PROPOFOL N/A 06/14/2019   Procedure: ESOPHAGOGASTRODUODENOSCOPY (EGD) WITH PROPOFOL;  Surgeon: Lucilla Lame, MD;  Location: [facility] ENDOSCOPY;  Service: Endoscopy;  Laterality: N/A;   . OVARIAN CYST DRAINAGE Right Nov. 2003  . TONSILLECTOMY    . TUBAL LIGATION      Medical History: Past Medical History:  Diagnosis Date  . Allergic rhinitis   . Anxiety   . Asthma   . Constipation   . COPD (chronic obstructive pulmonary disease) ([place])   . Depression   . Hematuria   . Insomnia   . Reflux     Family History: Family History  Problem Relation Age of Onset  . Cancer Mother        ovarian  . Stroke Father   . Cancer Maternal Uncle        lung  . Diabetes Maternal Grandmother   . Emphysema Maternal Grandfather   . Diabetes Paternal Grandmother   . Stroke Paternal Grandfather   . Breast cancer Neg Hx     Social History: Social History   Socioeconomic History  . Marital status: Married    Spouse name: Not on file  . Number of children: Not on file  . Years of education: Not on file  . Highest education level: Not on file  Occupational History  . Not on file  Tobacco Use  . Smoking status: Never Smoker  . Smokeless tobacco: Never Used  Vaping Use  . Vaping Use: Never used  Substance and Sexual Activity  . Alcohol use: Yes    Comment: occasional  . Drug use: No  . Sexual activity: Not on file  Other Topics Concern  . Not on file  Social History Narrative  . Not on file   Social Determinants of Health   Financial Resource Strain:   . Difficulty of Paying Living Expenses: Not on file  Food Insecurity:   . Worried About Charity fundraiser in the Last Year: Not on file  . Ran Out of Food in the Last Year: Not on file  Transportation Needs:   . Lack of Transportation (Medical): Not on file  . Lack of Transportation (Non-Medical): Not on file  Physical Activity:   . Days of Exercise per Week: Not on file  . Minutes of Exercise per Session: Not on file  Stress:   . Feeling of Stress : Not on file  Social Connections:   . Frequency of Communication with Friends and Family: Not on file  . Frequency of Social Gatherings with Friends and  Family: Not on file  . Attends Religious Services: Not on file  . Active Member of Clubs or Organizations: Not on file  . Attends Archivist Meetings: Not on file  . Marital Status: Not on file  Intimate Partner Violence:   . Fear of Current or Ex-Partner: Not on file  . Emotionally Abused: Not on file  . Physically Abused: Not on file  . Sexually Abused: Not on file    Vital Signs: Blood pressure 114/72, pulse 82, temperature 98.4 F (36.9 C), resp. rate 16, height 5\' 8"  (1.727 m), weight 189 lb 9.6 oz (86 kg), SpO2 98 %.  Examination: General Appearance: The patient is well-developed, well-nourished, and in no distress. Skin: Gross inspection of skin unremarkable. Head: normocephalic, no gross deformities. Eyes: no gross deformities noted. ENT: ears appear grossly normal no exudates. Neck: Supple. No thyromegaly. No LAD. Respiratory: no rhonchi noted. Cardiovascular: Normal S1 and S2 without murmur or rub. Extremities: No cyanosis. pulses are equal. Neurologic: Alert and oriented. No involuntary movements.  LABS: No results found for this or any previous visit (from the past 2160 hour(s)).  Radiology: US THYROID  Result Date: 07/18/2019 CLINICAL DATA:  Other.  Dysphagia, possible thyromegaly EXAM: THYROID ULTRASOUND TECHNIQUE: Ultrasound examination of the thyroid gland and adjacent soft tissues was performed. COMPARISON:  None. FINDINGS: Parenchymal Echotexture: Normal Isthmus: 0.3 cm Right lobe: 4.2 x 1.3 x 1.4 cm Left lobe: 3.2 x 1.0 x 1.3 cm _________________________________________________________ Estimated total number of nodules >/= 1 cm: 0 Number of spongiform nodules >/=  2 cm not described below (TR1): 0 Number of mixed cystic and solid nodules >/= 1.5 cm not described below (TR2): 0 _________________________________________________________ No discrete nodules are seen within the thyroid gland. IMPRESSION: Normal sonographic appearance of the thyroid gland.  Electronically Signed   By: Jacqulynn Cadet M.D.   On: 07/18/2019 11:21    No results found.  No results found.    Assessment and Plan: Patient Active Problem List   Diagnosis Date Noted  . Dysphagia   . Stricture and stenosis of esophagus   . Irritable bowel syndrome with diarrhea 09/04/2018  . Palpitations 08/12/2018  . Hyperlipidemia, mixed 08/02/2018  . Precordial pain 08/02/2018  . Family history of ovarian cancer 03/12/2016  . Vitamin B12 deficiency (non anemic) 11/18/2014  . Constipation   . Asthma   . Allergic rhinitis   . Depression   . Anxiety   . Hematuria   . Insomnia   . GERD without esophagitis     1. Asthma well controlled she has been on allergy shots with tremendous improvement 2. Overweight work on diet and exercise she is going to start up again 3. Allergic rhinitis nasal inhalers as prescribed 4. Sleep onset insomnia script for [place] as needed 5. GERD controlled will continue with present management recently had esophageal dilatation 6. OSA on CPAP for mild OSA in addition she does have insomnia prescription was written for Ambien which she uses on an as-needed basis.  Discussed side effect profile with medications.  General Counseling: I have discussed the findings of the evaluation and examination with [place].  I have also discussed any further diagnostic evaluation thatmay be needed or ordered today. [place] verbalizes understanding of the findings of todays visit. We also reviewed her medications today and discussed drug interactions and side effects including but not limited excessive drowsiness and altered mental states. We also discussed that there is always a risk not just to her but also people around her. she has been encouraged to call the office with any questions or concerns that should arise related to todays visit.  Orders Placed This Encounter  Procedures  . Spirometry with Graph    Order Specific Question:   Where should this test be  performed?    Answer:   [facility]    Order Specific Question:   Basic spirometry    Answer:   Yes    Order Specific Question:   Spirometry pre & post bronchodilator    Answer:   No     Time spent: 70  I have personally obtained a history, examined  the patient, evaluated laboratory and imaging results, formulated the assessment and plan and placed orders.    Allyne Gee, MD [facility] Pulmonary and Critical Care Sleep medicine

## 2019-12-30 MED ORDER — ZOLPIDEM TARTRATE 10 MG PO TABS: 10.0000 mg | ORAL_TABLET | Freq: Every evening | ORAL | 2 refills | Status: DC | PRN

## 2020-01-08 ENCOUNTER — Other Ambulatory Visit: Payer: Self-pay | Admitting: Nurse Practitioner

## 2020-01-08 NOTE — Telephone Encounter (Signed)
Requested Prescriptions  Pending Prescriptions Disp Refills   escitalopram (LEXAPRO) 20 MG tablet [Pharmacy Med Name: ESCITALOPRAM 20MG  TABLETS] 90 tablet 0    Sig: TAKE 1 TABLET(20 MG) BY MOUTH DAILY     Psychiatry:  Antidepressants - SSRI Passed - 01/08/2020 12:26 PM      Passed - Completed PHQ-2 or PHQ-9 in the last 360 days      Passed - Valid encounter within last 6 months    Recent Outpatient Visits          5 months ago Hand dermatitis   [place], [name], DO   5 months ago Esophageal dysphagia   [place], [place], DO   8 months ago Esophageal dysphagia   [place], [name], DO   1 year ago Upper respiratory tract infection, unspecified type   [place], DO   1 year ago Chest pain, unspecified type   [facility], [place], DO

## 2020-01-25 ENCOUNTER — Encounter: Payer: Self-pay | Admitting: Family Medicine

## 2020-01-25 MED ORDER — OMEPRAZOLE 40 MG PO CPDR
40.0000 mg | DELAYED_RELEASE_CAPSULE | Freq: Two times a day (BID) | ORAL | 1 refills | Status: DC
Start: 2020-01-25 — End: 2020-03-29

## 2020-02-01 ENCOUNTER — Other Ambulatory Visit: Payer: Self-pay | Admitting: Internal Medicine

## 2020-03-01 DIAGNOSIS — H02884 Meibomian gland dysfunction left upper eyelid: Secondary | ICD-10-CM | POA: Diagnosis not present

## 2020-03-16 ENCOUNTER — Other Ambulatory Visit: Payer: Self-pay | Admitting: Family Medicine

## 2020-03-16 MED ORDER — BUPROPION HCL ER (XL) 300 MG PO TB24
ORAL_TABLET | ORAL | 0 refills | Status: DC
Start: 2020-03-16 — End: 2020-03-29

## 2020-03-16 NOTE — Telephone Encounter (Signed)
Medication Refill - Medication: Bupropion   Has the patient contacted their pharmacy? Yes.   Pt states that she reached out to pharmacy, but they stated they couldn't get fax to office.  (Agent: If no, request that the patient contact the pharmacy for the refill.) (Agent: If yes, when and what did the pharmacy advise?)  Preferred Pharmacy (with phone number or street name):  [facility] [place], [address]  [place] [address]  Phone: [phone] Fax: [phone]  Hours: Not open 24 hours     Agent: Please be advised that RX refills may take up to 3 business days. We ask that you follow-up with your pharmacy.

## 2020-03-16 NOTE — Telephone Encounter (Signed)
Call to patient- appointment scheduled- courtesy #[phone] given

## 2020-03-29 ENCOUNTER — Ambulatory Visit: Payer: BC Managed Care – PPO | Admitting: Family Medicine

## 2020-03-29 ENCOUNTER — Encounter: Payer: Self-pay | Admitting: Family Medicine

## 2020-03-29 ENCOUNTER — Other Ambulatory Visit: Payer: Self-pay

## 2020-03-29 DIAGNOSIS — K219 Gastro-esophageal reflux disease without esophagitis: Secondary | ICD-10-CM | POA: Diagnosis not present

## 2020-03-29 DIAGNOSIS — F330 Major depressive disorder, recurrent, mild: Secondary | ICD-10-CM | POA: Diagnosis not present

## 2020-03-29 DIAGNOSIS — F419 Anxiety disorder, unspecified: Secondary | ICD-10-CM

## 2020-03-29 MED ORDER — ALBUTEROL SULFATE HFA 108 (90 BASE) MCG/ACT IN AERS
1.0000 | INHALATION_SPRAY | RESPIRATORY_TRACT | 6 refills | Status: DC | PRN
Start: 2020-03-29 — End: 2020-09-26

## 2020-03-29 MED ORDER — BUPROPION HCL ER (XL) 300 MG PO TB24
ORAL_TABLET | ORAL | 1 refills | Status: DC
Start: 2020-03-29 — End: 2020-09-26

## 2020-03-29 MED ORDER — OMEPRAZOLE 40 MG PO CPDR
40.0000 mg | DELAYED_RELEASE_CAPSULE | Freq: Two times a day (BID) | ORAL | 1 refills | Status: DC
Start: 2020-03-29 — End: 2020-09-26

## 2020-03-29 MED ORDER — ESCITALOPRAM OXALATE 20 MG PO TABS
ORAL_TABLET | ORAL | 1 refills | Status: DC
Start: 2020-03-29 — End: 2020-04-05

## 2020-03-29 NOTE — Assessment & Plan Note (Signed)
Under good control on current regimen. Continue current regimen. Continue to monitor. Call with any concerns. Refills given.   

## 2020-03-29 NOTE — Progress Notes (Signed)
BP 121/76   Pulse 81   Temp 98.7 F (37.1 C)   Wt 188 lb 6.4 oz (85.5 kg)   SpO2 97%   BMI 28.65 kg/m    Subjective:    Patient ID: [name], female    DOB: February 09, 1971, 50 y.o.   MRN: [id number]  HPI: [name] is a 50 y.o. female  Chief Complaint  Patient presents with  . Gastroesophageal Reflux  . Anxiety  . Insomnia   GERD GERD control status: better  Satisfied with current treatment? yes Heartburn frequency: very occasionally with food choices Medication side effects: no  Medication compliance: excellent Dysphagia: no Odynophagia:  no Hematemesis: no Blood in stool: no EGD: no  ANXIETY/STRESS Duration: chronic Status:controlled Anxious mood: no  Excessive worrying: no Irritability: no  Sweating: no Nausea: no Palpitations:no Hyperventilation: no Panic attacks: no Agoraphobia: no  Obscessions/compulsions: no Depressed mood: no Depression screen [facility] 2/9 07/14/2019 02/15/2019 07/20/2018 04/19/2018 08/03/2017  Decreased Interest 0 0 0 0 0  Down, Depressed, Hopeless 0 0 0 0 0  PHQ - 2 Score 0 0 0 0 0  Altered sleeping 0 - 1 1 -  Tired, decreased energy 0 - 1 1 -  Change in appetite 0 - 0 0 -  Feeling bad or failure about yourself  0 - 0 0 -  Trouble concentrating 0 - 0 0 -  Moving slowly or fidgety/restless 0 - 0 0 -  Suicidal thoughts 0 - 0 0 -  PHQ-9 Score 0 - 2 2 -  Difficult doing work/chores Not difficult at all - Not difficult at all Not difficult at all -   Anhedonia: no Weight changes: no Insomnia: no   Hypersomnia: no Fatigue/loss of energy: no Feelings of worthlessness: no Feelings of guilt: no Impaired concentration/indecisiveness: no Suicidal ideations: no  Crying spells: no Recent Stressors/Life Changes: no   Relationship problems: no   Family stress: no     Financial stress: no    Job stress: no    Recent death/loss: no   Relevant past medical, surgical, family and social history reviewed and updated as indicated.  Interim medical history since our last visit reviewed. Allergies and medications reviewed and updated.  Review of Systems  Constitutional: Negative.   Respiratory: Negative.   Cardiovascular: Negative.   Gastrointestinal: Negative.   Genitourinary: Negative.   Musculoskeletal: Negative.   Psychiatric/Behavioral: Negative.     Per HPI unless specifically indicated above     Objective:    BP 121/76   Pulse 81   Temp 98.7 F (37.1 C)   Wt 188 lb 6.4 oz (85.5 kg)   SpO2 97%   BMI 28.65 kg/m   Wt Readings from Last 3 Encounters:  03/29/20 188 lb 6.4 oz (85.5 kg)  12/29/19 189 lb 9.6 oz (86 kg)  07/27/19 186 lb 6.4 oz (84.6 kg)    Physical Exam Vitals and nursing note reviewed.  Constitutional:      General: She is not in acute distress.    Appearance: Normal appearance. She is not ill-appearing, toxic-appearing or diaphoretic.  HENT:     Head: Normocephalic and atraumatic.     Right Ear: External ear normal.     Left Ear: External ear normal.     Nose: Nose normal.     Mouth/Throat:     Mouth: Mucous membranes are moist.     Pharynx: Oropharynx is clear.  Eyes:     General: No scleral icterus.  Right eye: No discharge.        Left eye: No discharge.     Extraocular Movements: Extraocular movements intact.     Conjunctiva/sclera: Conjunctivae normal.     Pupils: Pupils are equal, round, and reactive to light.  Cardiovascular:     Rate and Rhythm: Normal rate and regular rhythm.     Pulses: Normal pulses.     Heart sounds: Normal heart sounds. No murmur heard. No friction rub. No gallop.   Pulmonary:     Effort: Pulmonary effort is normal. No respiratory distress.     Breath sounds: Normal breath sounds. No stridor. No wheezing, rhonchi or rales.  Chest:     Chest wall: No tenderness.  Musculoskeletal:        General: Normal range of motion.     Cervical back: Normal range of motion and neck supple.  Skin:    General: Skin is warm and dry.     Capillary  Refill: Capillary refill takes less than 2 seconds.     Coloration: Skin is not jaundiced or pale.     Findings: No bruising, erythema, lesion or rash.  Neurological:     General: No focal deficit present.     Mental Status: She is alert and oriented to person, place, and time. Mental status is at baseline.  Psychiatric:        Mood and Affect: Mood normal.        Behavior: Behavior normal.        Thought Content: Thought content normal.        Judgment: Judgment normal.     Results for orders placed or performed in visit on 07/27/19  CBC with Differential/Platelet  Result Value Ref Range   WBC 5.5 3.4 - 10.8 x10E3/uL   RBC 4.07 3.77 - 5.28 x10E6/uL   Hemoglobin 12.9 11.1 - 15.9 g/dL   Hematocrit 39.1 34.0 - 46.6 %   MCV 96 79 - 97 fL   MCH 31.7 26.6 - 33.0 pg   MCHC 33.0 31.5 - 35.7 g/dL   RDW 12.7 11.7 - 15.4 %   Platelets 266 150 - 450 x10E3/uL   Neutrophils 46 Not Estab. %   Lymphs 37 Not Estab. %   Monocytes 8 Not Estab. %   Eos 7 Not Estab. %   Basos 2 Not Estab. %   Neutrophils Absolute 2.6 1.4 - 7.0 x10E3/uL   Lymphocytes Absolute 2.0 0.7 - 3.1 x10E3/uL   Monocytes Absolute 0.4 0.1 - 0.9 x10E3/uL   EOS (ABSOLUTE) 0.4 0.0 - 0.4 x10E3/uL   Basophils Absolute 0.1 0.0 - 0.2 x10E3/uL   Immature Granulocytes 0 Not Estab. %   Immature Grans (Abs) 0.0 0.0 - 0.1 x10E3/uL  Comprehensive metabolic panel  Result Value Ref Range   Glucose 96 65 - 99 mg/dL   BUN 13 6 - 24 mg/dL   Creatinine, Ser 0.73 0.57 - 1.00 mg/dL   GFR calc non Af Amer 96 >59 mL/min/1.73   GFR calc Af Amer 111 >59 mL/min/1.73   BUN/Creatinine Ratio 18 9 - 23   Sodium 139 134 - 144 mmol/L   Potassium 4.3 3.5 - 5.2 mmol/L   Chloride 101 96 - 106 mmol/L   CO2 23 20 - 29 mmol/L   Calcium 9.8 8.7 - 10.2 mg/dL   Total Protein 6.9 6.0 - 8.5 g/dL   Albumin 4.7 3.8 - 4.8 g/dL   Globulin, Total 2.2 1.5 - 4.5 g/dL   Albumin/Globulin Ratio 2.1  1.2 - 2.2   Bilirubin Total <0.2 0.0 - 1.2 mg/dL   Alkaline  Phosphatase 70 48 - 121 IU/L   AST 16 0 - 40 IU/L   ALT 12 0 - 32 IU/L  TSH  Result Value Ref Range   TSH 2.370 0.450 - 4.500 uIU/mL  VITAMIN D 25 Hydroxy (Vit-D Deficiency, Fractures)  Result Value Ref Range   Vit D, 25-Hydroxy 43.6 30.0 - 100.0 ng/mL  Lipid Panel w/o Chol/HDL Ratio  Result Value Ref Range   Cholesterol, Total 260 (H) 100 - 199 mg/dL   Triglycerides 270 (H) 0 - 149 mg/dL   HDL 62 >39 mg/dL   VLDL Cholesterol Cal 49 (H) 5 - 40 mg/dL   LDL Chol Calc (NIH) 149 (H) 0 - 99 mg/dL  B12  Result Value Ref Range   Vitamin B-12 1,430 (H) 232 - 1,245 pg/mL      Assessment & Plan:   Problem List Items Addressed This Visit      Digestive   GERD without esophagitis    Under good control on current regimen. Continue current regimen. Continue to monitor. Call with any concerns. Refills given.        Relevant Medications   omeprazole (PRILOSEC) 40 MG capsule     Other   Depression    Under good control on current regimen. Continue current regimen. Continue to monitor. Call with any concerns. Refills given.        Relevant Medications   escitalopram (LEXAPRO) 20 MG tablet   buPROPion (WELLBUTRIN XL) 300 MG 24 hr tablet   Anxiety    Under good control on current regimen. Continue current regimen. Continue to monitor. Call with any concerns. Refills given.        Relevant Medications   escitalopram (LEXAPRO) 20 MG tablet   buPROPion (WELLBUTRIN XL) 300 MG 24 hr tablet       Follow up plan: Return in about 6 months (around 09/26/2020) for physical.

## 2020-04-05 ENCOUNTER — Other Ambulatory Visit: Payer: Self-pay | Admitting: Family Medicine

## 2020-04-11 ENCOUNTER — Ambulatory Visit (INDEPENDENT_AMBULATORY_CARE_PROVIDER_SITE_OTHER): Payer: BC Managed Care – PPO

## 2020-04-11 DIAGNOSIS — J301 Allergic rhinitis due to pollen: Secondary | ICD-10-CM

## 2020-04-15 ENCOUNTER — Other Ambulatory Visit: Payer: Self-pay | Admitting: Family Medicine

## 2020-04-17 ENCOUNTER — Other Ambulatory Visit: Payer: Self-pay | Admitting: Obstetrics & Gynecology

## 2020-04-17 DIAGNOSIS — Z1211 Encounter for screening for malignant neoplasm of colon: Secondary | ICD-10-CM | POA: Diagnosis not present

## 2020-04-17 DIAGNOSIS — Z124 Encounter for screening for malignant neoplasm of cervix: Secondary | ICD-10-CM | POA: Diagnosis not present

## 2020-04-17 DIAGNOSIS — Z1151 Encounter for screening for human papillomavirus (HPV): Secondary | ICD-10-CM | POA: Diagnosis not present

## 2020-04-17 DIAGNOSIS — Z1231 Encounter for screening mammogram for malignant neoplasm of breast: Secondary | ICD-10-CM | POA: Diagnosis not present

## 2020-04-17 DIAGNOSIS — Z01419 Encounter for gynecological examination (general) (routine) without abnormal findings: Secondary | ICD-10-CM | POA: Diagnosis not present

## 2020-04-23 DIAGNOSIS — J301 Allergic rhinitis due to pollen: Secondary | ICD-10-CM | POA: Diagnosis not present

## 2020-04-30 ENCOUNTER — Other Ambulatory Visit: Payer: Self-pay

## 2020-04-30 ENCOUNTER — Ambulatory Visit
Admission: RE | Admit: 2020-04-30 | Discharge: 2020-04-30 | Disposition: A | Discharge status: Home / Self Care | Payer: BC Managed Care – PPO | Source: Ambulatory Visit | Attending: Obstetrics & Gynecology | Admitting: Obstetrics & Gynecology

## 2020-04-30 DIAGNOSIS — Z1231 Encounter for screening mammogram for malignant neoplasm of breast: Secondary | ICD-10-CM

## 2020-05-01 ENCOUNTER — Encounter: Payer: Self-pay | Admitting: Family Medicine

## 2020-05-01 DIAGNOSIS — M9901 Segmental and somatic dysfunction of cervical region: Secondary | ICD-10-CM | POA: Diagnosis not present

## 2020-05-01 DIAGNOSIS — R519 Headache, unspecified: Secondary | ICD-10-CM | POA: Diagnosis not present

## 2020-05-01 DIAGNOSIS — M5414 Radiculopathy, thoracic region: Secondary | ICD-10-CM | POA: Diagnosis not present

## 2020-05-01 DIAGNOSIS — M9902 Segmental and somatic dysfunction of thoracic region: Secondary | ICD-10-CM | POA: Diagnosis not present

## 2020-05-02 NOTE — Telephone Encounter (Signed)
Copied from [place] [phone]. Topic: General - Other >> May 02, 2020  2:16 PM Lennox Solders wrote: Reason for CRM: Pt is calling checking to see if dr Wynetta Emery had responded to her mychart message concerning her back pain. Pt advise

## 2020-05-04 ENCOUNTER — Telehealth: Payer: Self-pay | Admitting: Family Medicine

## 2020-05-04 ENCOUNTER — Other Ambulatory Visit: Payer: Self-pay

## 2020-05-04 ENCOUNTER — Encounter: Payer: Self-pay | Admitting: Family Medicine

## 2020-05-04 ENCOUNTER — Ambulatory Visit: Payer: BC Managed Care – PPO | Admitting: Family Medicine

## 2020-05-04 ENCOUNTER — Other Ambulatory Visit: Payer: Self-pay | Admitting: Internal Medicine

## 2020-05-04 VITALS — BP 113/71 | HR 71 | Temp 98.1°F | Wt 191.2 lb

## 2020-05-04 DIAGNOSIS — M549 Dorsalgia, unspecified: Secondary | ICD-10-CM | POA: Diagnosis not present

## 2020-05-04 MED ORDER — KETOROLAC TROMETHAMINE 60 MG/2ML IM SOLN
60.0000 mg | Freq: Once | INTRAMUSCULAR | Status: AC
  Administered 2020-05-04: 60 mg via INTRAMUSCULAR

## 2020-05-04 MED ORDER — NAPROXEN 500 MG PO TABS: 500.0000 mg | ORAL_TABLET | Freq: Two times a day (BID) | ORAL | 1 refills | Status: DC

## 2020-05-04 MED ORDER — CYCLOBENZAPRINE HCL 10 MG PO TABS: 10.0000 mg | ORAL_TABLET | Freq: Three times a day (TID) | ORAL | 1 refills | Status: DC | PRN

## 2020-05-04 NOTE — Progress Notes (Signed)
BP 113/71   Pulse 71   Temp 98.1 F (36.7 C)   Wt 191 lb 3.2 oz (86.7 kg)   SpO2 97%   BMI 29.07 kg/m    Subjective:    Patient ID: [name], female    DOB: [birth year], 50 y.o.   MRN: [id number]  HPI: [name] is a 50 y.o. female  Chief Complaint  Patient presents with  . Back Pain    Patient states she is having mid back pain for over a week.    BACK PAIN Duration: 7-10 days Mechanism of injury: unknown Location: mid back and midline Onset: sudden Severity: severe Quality: sharp with movement, aching all the time Frequency: constant, waxing and waning Radiation: none Aggravating factors: laying down, moving Alleviating factors: chiropractor, sitting Status: worse Treatments attempted: chiropractry, ice, ibuprofen   Relief with NSAIDs?: mild Nighttime pain:  yes Paresthesias / decreased sensation:  no Bowel / bladder incontinence:  no Fevers:  no Dysuria / urinary frequency:  no  Relevant past medical, surgical, family and social history reviewed and updated as indicated. Interim medical history since our last visit reviewed. Allergies and medications reviewed and updated.  Review of Systems  Constitutional: Negative.   Respiratory: Negative.   Cardiovascular: Negative.   Gastrointestinal: Negative.   Musculoskeletal: Positive for back pain and myalgias. Negative for arthralgias, gait problem, joint swelling, neck pain and neck stiffness.  Skin: Negative.   Neurological: Negative.   Psychiatric/Behavioral: Negative.     Per HPI unless specifically indicated above     Objective:    BP 113/71   Pulse 71   Temp 98.1 F (36.7 C)   Wt 191 lb 3.2 oz (86.7 kg)   SpO2 97%   BMI 29.07 kg/m   Wt Readings from Last 3 Encounters:  05/04/20 191 lb 3.2 oz (86.7 kg)  03/29/20 188 lb 6.4 oz (85.5 kg)  12/29/19 189 lb 9.6 oz (86 kg)    Physical Exam Vitals and nursing note reviewed.  Constitutional:      General: She is not in acute distress.     Appearance: Normal appearance. She is not ill-appearing, toxic-appearing or diaphoretic.  HENT:     Head: Normocephalic and atraumatic.     Right Ear: External ear normal.     Left Ear: External ear normal.     Nose: Nose normal.     Mouth/Throat:     Mouth: Mucous membranes are moist.     Pharynx: Oropharynx is clear.  Eyes:     General: No scleral icterus.       Right eye: No discharge.        Left eye: No discharge.     Extraocular Movements: Extraocular movements intact.     Conjunctiva/sclera: Conjunctivae normal.     Pupils: Pupils are equal, round, and reactive to light.  Cardiovascular:     Rate and Rhythm: Normal rate and regular rhythm.     Pulses: Normal pulses.     Heart sounds: Normal heart sounds. No murmur heard. No friction rub. No gallop.   Pulmonary:     Effort: Pulmonary effort is normal. No respiratory distress.     Breath sounds: Normal breath sounds. No stridor. No wheezing, rhonchi or rales.  Chest:     Chest wall: No tenderness.  Musculoskeletal:        General: Normal range of motion.     Cervical back: Normal range of motion and neck supple.  Comments: Hypertonic paraspinals at TL junction  Skin:    General: Skin is warm and dry.     Capillary Refill: Capillary refill takes less than 2 seconds.     Coloration: Skin is not jaundiced or pale.     Findings: No bruising, erythema, lesion or rash.  Neurological:     General: No focal deficit present.     Mental Status: She is alert and oriented to person, place, and time. Mental status is at baseline.  Psychiatric:        Mood and Affect: Mood normal.        Behavior: Behavior normal.        Thought Content: Thought content normal.        Judgment: Judgment normal.     Results for orders placed or performed in visit on 07/27/19  CBC with Differential/Platelet  Result Value Ref Range   WBC 5.5 3.4 - 10.8 x10E3/uL   RBC 4.07 3.77 - 5.28 x10E6/uL   Hemoglobin 12.9 11.1 - 15.9 g/dL   Hematocrit  39.1 34.0 - 46.6 %   MCV 96 79 - 97 fL   MCH 31.7 26.6 - 33.0 pg   MCHC 33.0 31.5 - 35.7 g/dL   RDW 12.7 11.7 - 15.4 %   Platelets 266 150 - 450 x10E3/uL   Neutrophils 46 Not Estab. %   Lymphs 37 Not Estab. %   Monocytes 8 Not Estab. %   Eos 7 Not Estab. %   Basos 2 Not Estab. %   Neutrophils Absolute 2.6 1.4 - 7.0 x10E3/uL   Lymphocytes Absolute 2.0 0.7 - 3.1 x10E3/uL   Monocytes Absolute 0.4 0.1 - 0.9 x10E3/uL   EOS (ABSOLUTE) 0.4 0.0 - 0.4 x10E3/uL   Basophils Absolute 0.1 0.0 - 0.2 x10E3/uL   Immature Granulocytes 0 Not Estab. %   Immature Grans (Abs) 0.0 0.0 - 0.1 x10E3/uL  Comprehensive metabolic panel  Result Value Ref Range   Glucose 96 65 - 99 mg/dL   BUN 13 6 - 24 mg/dL   Creatinine, Ser 0.73 0.57 - 1.00 mg/dL   GFR calc non Af Amer 96 >59 mL/min/1.73   GFR calc Af Amer 111 >59 mL/min/1.73   BUN/Creatinine Ratio 18 9 - 23   Sodium 139 134 - 144 mmol/L   Potassium 4.3 3.5 - 5.2 mmol/L   Chloride 101 96 - 106 mmol/L   CO2 23 20 - 29 mmol/L   Calcium 9.8 8.7 - 10.2 mg/dL   Total Protein 6.9 6.0 - 8.5 g/dL   Albumin 4.7 3.8 - 4.8 g/dL   Globulin, Total 2.2 1.5 - 4.5 g/dL   Albumin/Globulin Ratio 2.1 1.2 - 2.2   Bilirubin Total <0.2 0.0 - 1.2 mg/dL   Alkaline Phosphatase 70 48 - 121 IU/L   AST 16 0 - 40 IU/L   ALT 12 0 - 32 IU/L  TSH  Result Value Ref Range   TSH 2.370 0.450 - 4.500 uIU/mL  VITAMIN D 25 Hydroxy (Vit-D Deficiency, Fractures)  Result Value Ref Range   Vit D, 25-Hydroxy 43.6 30.0 - 100.0 ng/mL  Lipid Panel w/o Chol/HDL Ratio  Result Value Ref Range   Cholesterol, Total 260 (H) 100 - 199 mg/dL   Triglycerides 270 (H) 0 - 149 mg/dL   HDL 62 >39 mg/dL   VLDL Cholesterol Cal 49 (H) 5 - 40 mg/dL   LDL Chol Calc (NIH) 149 (H) 0 - 99 mg/dL  B12  Result Value Ref Range   Vitamin  B-12 1,430 (H) 232 - 1,245 pg/mL      Assessment & Plan:   Problem List Items Addressed This Visit   None   Visit Diagnoses    Mid back pain    -  Primary   Will  treat with toradol, flexeril, naproxen and exercises. Continue to follow with chiropractry. Call with any concerns or if not getting better.    Relevant Medications   cyclobenzaprine (FLEXERIL) 10 MG tablet   naproxen (NAPROSYN) 500 MG tablet   ketorolac (TORADOL) injection 60 mg (Start on 05/04/2020  3:30 PM)       Follow up plan: Return if symptoms worsen or fail to improve.

## 2020-05-04 NOTE — Patient Instructions (Addendum)
Thoracic Strain Rehab Ask your health care provider which exercises are safe for you. Do exercises exactly as told by your health care provider and adjust them as directed. It is normal to feel mild stretching, pulling, tightness, or discomfort as you do these exercises. Stop right away if you feel sudden pain or your pain gets worse. Do not begin these exercises until told by your health care provider. Stretching and range-of-motion exercise This exercise warms up your muscles and joints and improves the movement and flexibility of your back and shoulders. This exercise also helps to relieve pain. Chest and spine stretch 1. Lie down on your back on a firm surface. 2. Roll a towel or a small blanket so it is about 4 inches (10 cm) in diameter. 3. Put the towel lengthwise under the middle of your back so it is under your spine, but not under your shoulder blades. 4. Put your hands behind your head and let your elbows fall to your sides. This will increase your stretch. 5. Take a deep breath (inhale). 6. Hold for __________ seconds. 7. Relax after you breathe out (exhale). Repeat __________ times. Complete this exercise __________ times a day.   Strengthening exercises These exercises build strength and endurance in your back and your shoulder blade muscles. Endurance is the ability to use your muscles for a long time, even after they get tired. Alternating arm and leg raises 1. Get on your hands and knees on a firm surface. If you are on a hard floor, you may want to use padding, such as an exercise mat, to cushion your knees. 2. Line up your arms and legs. Your hands should be directly below your shoulders, and your knees should be directly below your hips. 3. Lift your left leg behind you. At the same time, raise your right arm and straighten it in front of you. ? Do not lift your leg higher than your hip. ? Do not lift your arm higher than your shoulder. ? Keep your abdominal and back muscles  tight. ? Keep your hips facing the ground. ? Do not arch your back. ? Keep your balance carefully, and do not hold your breath. 4. Hold for __________ seconds. 5. Slowly return to the starting position and repeat with your right leg and your left arm. Repeat __________ times. Complete this exercise __________ times a day.   Straight arm rows This exercise is also called shoulder extension exercise. 1. Stand with your feet shoulder width apart. 2. Secure an exercise band to a stable object in front of you so the band is at or above shoulder height. 3. Hold one end of the exercise band in each hand. 4. Straighten your elbows and lift your hands up to shoulder height. 5. Step back, away from the secured end of the exercise band, until the band stretches. 6. Squeeze your shoulder blades together and pull your hands down to the sides of your thighs. Stop when your hands are straight down by your sides. This is shoulder extension. Do not let your hands go behind your body. 7. Hold for __________ seconds. 8. Slowly return to the starting position. Repeat __________ times. Complete this exercise __________ times a day.   Prone shoulder external rotation 1. Lie on your abdomen on a firm bed so your left / right forearm hangs over the edge of the bed and your upper arm is on the bed, straight out from your body. This is the prone position. ? Your elbow  should be bent. ? Your palm should be facing your feet. 2. If instructed, hold a __________ weight in your hand. 3. Squeeze your shoulder blade toward the middle of your back. Do not let your shoulder lift toward your ear. 4. Keep your elbow bent in a 90-degree angle (right angle) while you slowly move your forearm up toward the ceiling. Move your forearm up to the height of the bed, toward your head. This is external rotation. ? Your upper arm should not move. ? At the top of the movement, your palm should face the floor. 5. Hold for __________  seconds. 6. Slowly return to the starting position and relax your muscles. Repeat __________ times. Complete this exercise __________ times a day. Rowing scapular retraction This is an exercise in which the shoulder blades (scapulae) are pulled toward each other (retraction). 1. Sit in a stable chair without armrests, or stand up. 2. Secure an exercise band to a stable object in front of you so the band is at shoulder height. 3. Hold one end of the exercise band in each hand. Your palms should face down. 4. Bring your arms out straight in front of you. 5. Step back, away from the secured end of the exercise band, until the band stretches. 6. Pull the band backward. As you do this, bend your elbows and squeeze your shoulder blades together, but avoid letting the rest of your body move. Do not shrug your shoulders upward while you do this. 7. Stop when your elbows are at your sides or slightly behind your body. 8. Hold for __________ seconds. 9. Slowly straighten your arms to return to the starting position. Repeat __________ times. Complete this exercise __________ times a day.   Posture and body mechanics Good posture and healthy body mechanics can help to relieve stress in your body's tissues and joints. Body mechanics refers to the movements and positions of your body while you do your daily activities. Posture is part of body mechanics. Good posture means:  Your spine is in its natural S-curve position (neutral).  Your shoulders are pulled back slightly.  Your head is not tipped forward. Follow these guidelines to improve your posture and body mechanics in your everyday activities. Standing  When standing, keep your spine neutral and your feet about hip width apart. Keep a slight bend in your knees. Your ears, shoulders, and hips should line up with each other.  When you do a task in which you lean forward while standing in one place for a long time, place one foot up on a stable  object that is 2-4 inches (5-10 cm) high, such as a footstool. This helps keep your spine neutral.   Sitting  When sitting, keep your spine neutral and keep your feet flat on the floor. Use a footrest, if necessary, and keep your thighs parallel to the floor. Avoid rounding your shoulders, and avoid tilting your head forward.  When working at a desk or a computer, keep your desk at a height where your hands are slightly lower than your elbows. Slide your chair under your desk so you are close enough to maintain good posture.  When working at a computer, place your monitor at a height where you are looking straight ahead and you do not have to tilt your head forward or downward to look at the screen.   Resting When lying down and resting, avoid positions that are most painful for you.  If you have pain with activities such  as sitting, bending, stooping, or squatting (flexion-basedactivities), lie in a position in which your body does not bend very much. For example, avoid curling up on your side with your arms and knees near your chest (fetal position).  If you have pain with activities such as standing for a long time or reaching with your arms (extension-basedactivities), lie with your spine in a neutral position and bend your knees slightly. Try the following positions: ? Lie on your side with a pillow between your knees. ? Lie on your back with a pillow under your knees.   Lifting  When lifting objects, keep your feet at least shoulder width apart and tighten your abdominal muscles.  Bend your knees and hips and keep your spine neutral. It is important to lift using the strength of your legs, not your back. Do not lock your knees straight out.  Always ask for help to lift heavy or awkward objects.   This information is not intended to replace advice given to you by your health care provider. Make sure you discuss any questions you have with your health care provider. Document Revised:  06/04/2018 Document Reviewed: 03/22/2018 Elsevier Patient Education  2021 [employer].

## 2020-05-04 NOTE — Telephone Encounter (Signed)
Patient is calling again to get advice regarding her back pain.  She stated she sent a My Chart message to doctor over 48 hrs. Ago and still has not heard from the doctor or nurse.  Patient is not sure if she should make an appt. But would like to speak with a nurse to advise her.  Please call patient to discuss at [phone]

## 2020-05-04 NOTE — Telephone Encounter (Signed)
Appointment today at 3:00 PM.

## 2020-05-14 ENCOUNTER — Other Ambulatory Visit: Payer: Self-pay | Admitting: Internal Medicine

## 2020-06-27 ENCOUNTER — Encounter: Payer: Self-pay | Admitting: Hospice and Palliative Medicine

## 2020-06-27 ENCOUNTER — Ambulatory Visit: Payer: BC Managed Care – PPO | Admitting: Hospice and Palliative Medicine

## 2020-06-27 ENCOUNTER — Other Ambulatory Visit: Payer: Self-pay

## 2020-06-27 VITALS — BP 128/82 | HR 80 | Temp 97.1°F | Resp 16 | Ht 68.0 in | Wt 189.0 lb

## 2020-06-27 DIAGNOSIS — Z7189 Other specified counseling: Secondary | ICD-10-CM

## 2020-06-27 DIAGNOSIS — G4733 Obstructive sleep apnea (adult) (pediatric): Secondary | ICD-10-CM

## 2020-06-27 DIAGNOSIS — Z9989 Dependence on other enabling machines and devices: Secondary | ICD-10-CM

## 2020-06-27 DIAGNOSIS — J4530 Mild persistent asthma, uncomplicated: Secondary | ICD-10-CM | POA: Diagnosis not present

## 2020-06-27 NOTE — Progress Notes (Signed)
[facility] [place], [place] [address]  Pulmonary Sleep Medicine   Office Visit Note  Patient Name: [name] DOB: 04-Mar-1970 MRN [id number]  Date of Service: 06/28/2020  Complaints/HPI: Patient is here for routine pulmonary follow-up Followed for asthma as well as OSA Asthma remains well controlled as she continues on bi-weekly allergy injections Reports nightly compliance with CPAP for OSA Sleeps well with CPAP--takes Ambien as needed to help with insomnia Denies negative side effects such as congestion, cough or dryness Cleaning machine by hand and changing tubing and filters as directed  ROS  General: (-) fever, (-) chills, (-) night sweats, (-) weakness Skin: (-) rashes, (-) itching,. Eyes: (-) visual changes, (-) redness, (-) itching. Nose and Sinuses: (-) nasal stuffiness or itchiness, (-) postnasal drip, (-) nosebleeds, (-) sinus trouble. Mouth and Throat: (-) sore throat, (-) hoarseness. Neck: (-) swollen glands, (-) enlarged thyroid, (-) neck pain. Respiratory: - cough, (-) bloody sputum, - shortness of breath, - wheezing. Cardiovascular: - ankle swelling, (-) chest pain. Lymphatic: (-) lymph node enlargement. Neurologic: (-) numbness, (-) tingling. Psychiatric: (-) anxiety, (-) depression   Current Medication: Outpatient Encounter Medications as of 06/27/2020  Medication Sig Note  . albuterol (VENTOLIN HFA) 108 (90 Base) MCG/ACT inhaler Inhale 1-2 puffs into the lungs every 4 (four) hours as needed for wheezing or shortness of breath.   Marland Kitchen buPROPion (WELLBUTRIN XL) 300 MG 24 hr tablet TAKE 1 TABLET(300 MG) BY MOUTH DAILY   . cetirizine (ZYRTEC) 10 MG tablet Take 10 mg by mouth daily.   . cyclobenzaprine (FLEXERIL) 10 MG tablet Take 1 tablet (10 mg total) by mouth 3 (three) times daily as needed for muscle spasms.   Marland Kitchen DALIRESP 500 MCG TABS tablet TAKE 1 TABLET BY MOUTH DAILY   . EPINEPHrine (EPIPEN 2-PAK) 0.3 mg/0.3 mL IJ SOAJ injection  Inject 0.3 mLs (0.3 mg total) into the muscle as needed for anaphylaxis.   Marland Kitchen escitalopram (LEXAPRO) 20 MG tablet TAKE 1 TABLET(20 MG) BY MOUTH DAILY   . Insulin Syringe-Needle U-100 (INSULIN SYRINGE 1CC/31GX5/16") 31G X 5/16" 1 ML MISC USE FOR ALLERGIES TWO TIMES A WEEK   . montelukast (SINGULAIR) 10 MG tablet TAKE 1 TABLET(10 MG) BY MOUTH DAILY   . naproxen (NAPROSYN) 500 MG tablet Take 1 tablet (500 mg total) by mouth 2 (two) times daily with a meal.   . omeprazole (PRILOSEC) 40 MG capsule Take 1 capsule (40 mg total) by mouth in the morning and at bedtime. For 30 days.   Marland Kitchen triamcinolone ointment (KENALOG) 0.1 % APPLY TO THE AFFECTED AREA LEG, ARM, AND TRUNK TWICE DAILY. AVOID USE ON FACE   . Vitamin D, Ergocalciferol, (DRISDOL) 50000 units CAPS capsule Take 50,000 Units by mouth once a week. 11/28/2015: Received from: External Pharmacy  . zolpidem (AMBIEN) 10 MG tablet Take 1 tablet (10 mg total) by mouth at bedtime as needed for sleep.   . [DISCONTINUED] cyanocobalamin 2000 MCG tablet Take 2,000 mcg by mouth daily. (Patient not taking: No sig reported)    No facility-administered encounter medications on file as of 06/27/2020.    Surgical History: Past Surgical History:  Procedure Laterality Date  . ABDOMINAL HYSTERECTOMY    . APPENDECTOMY    . ESOPHAGOGASTRODUODENOSCOPY (EGD) WITH PROPOFOL N/A 06/14/2019   Procedure: ESOPHAGOGASTRODUODENOSCOPY (EGD) WITH PROPOFOL;  Surgeon: Lucilla Lame, MD;  Location: [facility] ENDOSCOPY;  Service: Endoscopy;  Laterality: N/A;  . OVARIAN CYST DRAINAGE Right Nov. 2003  . TONSILLECTOMY    .  TUBAL LIGATION      Medical History: Past Medical History:  Diagnosis Date  . Allergic rhinitis   . Anxiety   . Asthma   . Constipation   . COPD (chronic obstructive pulmonary disease) ([place])   . Depression   . Hematuria   . Insomnia   . Reflux     Family History: Family History  Problem Relation Age of Onset  . Cancer Mother        ovarian  . Stroke Father    . Cancer Maternal Uncle        lung  . Diabetes Maternal Grandmother   . Emphysema Maternal Grandfather   . Diabetes Paternal Grandmother   . Stroke Paternal Grandfather   . Breast cancer Neg Hx     Social History: Social History   Socioeconomic History  . Marital status: Married    Spouse name: Not on file  . Number of children: Not on file  . Years of education: Not on file  . Highest education level: Not on file  Occupational History  . Not on file  Tobacco Use  . Smoking status: Never Smoker  . Smokeless tobacco: Never Used  Vaping Use  . Vaping Use: Never used  Substance and Sexual Activity  . Alcohol use: Yes    Comment: occasional  . Drug use: No  . Sexual activity: Not on file  Other Topics Concern  . Not on file  Social History Narrative  . Not on file   Social Determinants of Health   Financial Resource Strain: Not on file  Food Insecurity: Not on file  Transportation Needs: Not on file  Physical Activity: Not on file  Stress: Not on file  Social Connections: Not on file  Intimate Partner Violence: Not on file    Vital Signs: Blood pressure 128/82, pulse 80, temperature (!) 97.1 F (36.2 C), resp. rate 16, height 5\' 8"  (1.727 m), weight 189 lb (85.7 kg), SpO2 98 %.  Examination: General Appearance: The patient is well-developed, well-nourished, and in no distress. Skin: Gross inspection of skin unremarkable. Head: normocephalic, no gross deformities. Eyes: no gross deformities noted. ENT: ears appear grossly normal no exudates. Neck: Supple. No thyromegaly. No LAD. Respiratory: Clear throughout, no rhonchi, wheeze or rales noted. Cardiovascular: Normal S1 and S2 without murmur or rub. Extremities: No cyanosis. pulses are equal. Neurologic: Alert and oriented. No involuntary movements.  LABS: No results found for this or any previous visit (from the past 2160 hour(s)).  Radiology: MM 3D SCREEN BREAST BILATERAL  Result Date:  05/03/2020 CLINICAL DATA:  Screening. EXAM: DIGITAL SCREENING BILATERAL MAMMOGRAM WITH TOMOSYNTHESIS AND CAD TECHNIQUE: Bilateral screening digital craniocaudal and mediolateral oblique mammograms were obtained. Bilateral screening digital breast tomosynthesis was performed. The images were evaluated with computer-aided detection. COMPARISON:  Previous exam(s). ACR Breast Density Category b: There are scattered areas of fibroglandular density. FINDINGS: There are no findings suspicious for malignancy. The images were evaluated with computer-aided detection. IMPRESSION: No mammographic evidence of malignancy. A result letter of this screening mammogram will be mailed directly to the patient. RECOMMENDATION: Screening mammogram in one year. (Code:SM-B-01Y) BI-RADS CATEGORY  1: Negative. Electronically Signed   By: Lajean Manes M.D.   On: 05/03/2020 10:01    No results found.  No results found.    Assessment and Plan: Patient Active Problem List   Diagnosis Date Noted  . Dysphagia   . Stricture and stenosis of esophagus   . Irritable bowel syndrome with diarrhea 09/04/2018  .  Palpitations 08/12/2018  . Hyperlipidemia, mixed 08/02/2018  . Precordial pain 08/02/2018  . Family history of ovarian cancer 03/12/2016  . Vitamin B12 deficiency (non anemic) 11/18/2014  . Constipation   . Asthma   . Allergic rhinitis   . Depression   . Anxiety   . Hematuria   . Insomnia   . GERD without esophagitis    1. OSA on CPAP Encouraged to continue with nightly CPAP compliance  2. CPAP use counseling Discussed importance of adequate CPAP use as well as proper care and cleaning techniques of machine and all supplies.  3. Mild persistent asthma without complication Symptoms remain well controlled while receiving allergy injections, continue with all supportive measures  General Counseling: I have discussed the findings of the evaluation and examination with Karole.  I have also discussed any further  diagnostic evaluation thatmay be needed or ordered today. Farheen verbalizes understanding of the findings of todays visit. We also reviewed her medications today and discussed drug interactions and side effects including but not limited excessive drowsiness and altered mental states. We also discussed that there is always a risk not just to her but also people around her. she has been encouraged to call the office with any questions or concerns that should arise related to todays visit.   Time spent: 25  I have personally obtained a history, examined the patient, evaluated laboratory and imaging results, formulated the assessment and plan and placed orders. This patient was seen by Theodoro Grist, AGNP-C in collaboration with Dr. Devona Konig as a part of collaborative care agreement.     Allyne Gee, MD [facility] Pulmonary and Critical Care Sleep medicine

## 2020-06-28 ENCOUNTER — Encounter: Payer: Self-pay | Admitting: Hospice and Palliative Medicine

## 2020-06-29 ENCOUNTER — Telehealth (INDEPENDENT_AMBULATORY_CARE_PROVIDER_SITE_OTHER): Payer: BC Managed Care – PPO | Admitting: Nurse Practitioner

## 2020-06-29 ENCOUNTER — Encounter: Payer: Self-pay | Admitting: Nurse Practitioner

## 2020-06-29 DIAGNOSIS — J301 Allergic rhinitis due to pollen: Secondary | ICD-10-CM | POA: Diagnosis not present

## 2020-06-29 MED ORDER — PREDNISONE 10 MG PO TABS: ORAL_TABLET | ORAL | 0 refills | Status: DC

## 2020-06-29 NOTE — Assessment & Plan Note (Signed)
Recent allergy flare with the current season and weather. Goes to allergist and gets shots to help with symptoms. With recent, continued symptoms will treat with a prednisone taper. Continue supportive treatment including rest, fluids, singulair, zyrtec, inhaler, and benadryl. Follow-up if symptoms don't improve or worsen.

## 2020-06-29 NOTE — Patient Instructions (Signed)
Allergies, Adult An allergy is a condition in which the body's defense system (immune system) comes in contact with an allergen and reacts to it. An allergen is anything that causes an allergic reaction. Allergens cause the immune system to make proteins for fighting infections (antibodies). These antibodies cause cells to release chemicals called histamines that set off the symptoms of an allergic reaction. Allergies often affect the nasal passages (allergic rhinitis), eyes (allergic conjunctivitis), skin (atopic dermatitis), and stomach. Allergies can be mild, moderate, or severe. They cannot spread from person to person. Allergies can develop at any age and may be outgrown. What are the causes? This condition is caused by allergens. Common allergens include:  Outdoor allergens, such as pollen, car fumes, and mold.  Indoor allergens, such as dust, smoke, mold, and pet dander.  Other allergens, such as foods, medicines, scents, insect bites or stings, and other skin irritants. What increases the risk? You are more likely to develop this condition if you have:  Family members with allergies.  Family members who have any condition that may be caused by allergens, such as asthma. This may make you more likely to have other allergies. What are the signs or symptoms? Symptoms of this condition depend on the severity of the allergy. Mild to moderate symptoms  Runny nose, stuffy nose (nasal congestion), or sneezing.  Itchy mouth, ears, or throat.  A feeling of mucus dripping down the back of your throat (postnasal drip).  Sore throat.  Itchy, red, watery, or puffy eyes.  Skin rash, or itchy, red, swollen areas of skin (hives).  Stomach cramps or bloating. Severe symptoms Severe allergies to food, medicine, or insect bites may cause anaphylaxis, which can be life-threatening. Symptoms include:  A red (flushed) face.  Wheezing or coughing.  Swollen lips, tongue, or mouth.  Tight or  swollen throat.  Chest pain or tightness, or rapid heartbeat.  Trouble breathing or shortness of breath.  Pain in the abdomen, vomiting, or diarrhea.  Dizziness or fainting. How is this diagnosed? This condition is diagnosed based on your symptoms, your family and medical history, and a physical exam. You may also have tests, including:  Skin tests to see how your skin reacts to allergens that may be causing your symptoms. Tests include: ? Skin prick test. For this test, an allergen is introduced to your body through a small opening in the skin. ? Intradermal skin test. For this test, a small amount of allergen is injected under the first layer of your skin. ? Patch test. For this test, a small amount of allergen is placed on your skin. The area is covered and then checked after a few days.  Blood tests.  A challenge test. For this test, you will eat or breathe in a small amount of allergen to see if you have an allergic reaction. You may also be asked to:  Keep a food diary. This is a record of all the foods, drinks, and symptoms you have in a day.  Try an elimination diet. To do this: ? Remove certain foods from your diet. ? Add those foods back one by one to find out if any foods cause an allergic reaction. How is this treated? Treatment for allergies depends on your symptoms. Treatment may include:  Cold, wet cloths (cold compresses) to soothe itching and swelling.  Eye drops or nasal sprays.  Nasal irrigation to help clear your mucus or keep the nasal passages moist.  A humidifier to add moisture to the   air.  Skin creams to treat rashes or itching.  Oral antihistamines or other medicines to block the reaction or to treat inflammation.  Diet changes to remove foods that cause allergies.  Being exposed again and again to tiny amounts of allergens to help you build a defense against it (tolerance). This is called immunotherapy. Examples include: ? Allergy shot. You  receive an injection that contains an allergen. ? Sublingual immunotherapy. You take a small dose of allergen under your tongue.  Emergency injection for anaphylaxis. You give yourself a shot using a syringe (auto-injector) that contains the amount of medicine you need. Your health care provider will teach you how to give yourself an injection.      Follow these instructions at home: Medicines  Take or apply over-the-counter and prescription medicines only as told by your health care provider.  Always carry your auto-injector pen if you are at risk of anaphylaxis. Give yourself an injection as told by your health care provider.   Eating and drinking  Follow instructions from your health care provider about eating or drinking restrictions.  Drink enough fluid to keep your urine pale yellow. General instructions  Wear a medical alert bracelet or necklace to let others know that you have had anaphylaxis before.  Avoid known allergens whenever possible.  Keep all follow-up visits as told by your health care provider. This is important. Contact a health care provider if:  Your symptoms do not get better with treatment. Get help right away if:  You have symptoms of anaphylaxis. These include: ? Swollen mouth, tongue, or throat. ? Pain or tightness in your chest. ? Trouble breathing or shortness of breath. ? Dizziness or fainting. ? Severe abdominal pain, vomiting, or diarrhea. These symptoms may represent a serious problem that is an emergency. Do not wait to see if the symptoms will go away. Get medical help right away. Call your local emergency services (911 in the [place] Do not drive yourself to the hospital. Summary  Take or apply over-the-counter and prescription medicines only as told by your health care provider.  Avoid known allergens when possible.  Always carry your auto-injector pen if you are at risk of anaphylaxis. Give yourself an injection as told by your health  care provider.  Wear a medical alert bracelet or necklace to let others know that you have had anaphylaxis before.  Anaphylaxis is a life-threatening emergency. Get help right away. This information is not intended to replace advice given to you by your health care provider. Make sure you discuss any questions you have with your health care provider. Document Revised: 10/10/2019 Document Reviewed: 12/22/2018 Elsevier Patient Education  2021 [employer] Inc.  

## 2020-06-29 NOTE — Progress Notes (Signed)
Acute Office Visit  Subjective:    Patient ID: [name], female    DOB: 03-Jul-1970, 50 y.o.   MRN: [id number]  Chief Complaint  Patient presents with  . Allergic Rhinitis     Pt states she has been having trouble with her breathing and allergies. States she has been having to use her inhaler a lot more than usual    HPI Patient is in today for worsening of allergies. If this happens, she usually takes prednisone to help with her symptoms.   ALLERGIES  Duration: weeks Runny nose: yes "clear Nasal congestion: yes Nasal itching: yes Sneezing: yes Eye swelling, itching or discharge: yes Post nasal drip: yes Cough: yes Sinus pressure: no  Ear pain: no  Ear pressure: a few weeks ago, subsided  Fever: no  Symptoms occur seasonally: yes Symptoms occur perenially: yes Satisfied with current treatment: yes Allergist evaluation in past: yes Allergen injection immunotherapy: yes Recurrent sinus infections: no ENT evaluation in past: no Known environmental allergy: seasonal in spring Current allergy medications: benadryl, singulair and zyrtec Treatments attempted: benadryl, singulair and zyrtec   Past Medical History:  Diagnosis Date  . Allergic rhinitis   . Anxiety   . Asthma   . Constipation   . COPD (chronic obstructive pulmonary disease) ([place])   . Depression   . Hematuria   . Insomnia   . Reflux     Past Surgical History:  Procedure Laterality Date  . ABDOMINAL HYSTERECTOMY    . APPENDECTOMY    . ESOPHAGOGASTRODUODENOSCOPY (EGD) WITH PROPOFOL N/A 06/14/2019   Procedure: ESOPHAGOGASTRODUODENOSCOPY (EGD) WITH PROPOFOL;  Surgeon: Lucilla Lame, MD;  Location: [facility] ENDOSCOPY;  Service: Endoscopy;  Laterality: N/A;  . OVARIAN CYST DRAINAGE Right Nov. 2003  . TONSILLECTOMY    . TUBAL LIGATION      Family History  Problem Relation Age of Onset  . Cancer Mother        ovarian  . Stroke Father   . Cancer Maternal Uncle        lung  . Diabetes Maternal  Grandmother   . Emphysema Maternal Grandfather   . Diabetes Paternal Grandmother   . Stroke Paternal Grandfather   . Breast cancer Neg Hx     Social History   Socioeconomic History  . Marital status: Married    Spouse name: Not on file  . Number of children: Not on file  . Years of education: Not on file  . Highest education level: Not on file  Occupational History  . Not on file  Tobacco Use  . Smoking status: Never Smoker  . Smokeless tobacco: Never Used  Vaping Use  . Vaping Use: Never used  Substance and Sexual Activity  . Alcohol use: Yes    Comment: occasional  . Drug use: No  . Sexual activity: Not on file  Other Topics Concern  . Not on file  Social History Narrative  . Not on file   Social Determinants of Health   Financial Resource Strain: Not on file  Food Insecurity: Not on file  Transportation Needs: Not on file  Physical Activity: Not on file  Stress: Not on file  Social Connections: Not on file  Intimate Partner Violence: Not on file    Outpatient Medications Prior to Visit  Medication Sig Dispense Refill  . albuterol (VENTOLIN HFA) 108 (90 Base) MCG/ACT inhaler Inhale 1-2 puffs into the lungs every 4 (four) hours as needed for wheezing or shortness of breath. 18 g 6  .  buPROPion (WELLBUTRIN XL) 300 MG 24 hr tablet TAKE 1 TABLET(300 MG) BY MOUTH DAILY 90 tablet 1  . cetirizine (ZYRTEC) 10 MG tablet Take 10 mg by mouth daily.    . cyclobenzaprine (FLEXERIL) 10 MG tablet Take 1 tablet (10 mg total) by mouth 3 (three) times daily as needed for muscle spasms. 30 tablet 1  . DALIRESP 500 MCG TABS tablet TAKE 1 TABLET BY MOUTH DAILY 90 tablet 1  . EPINEPHrine (EPIPEN 2-PAK) 0.3 mg/0.3 mL IJ SOAJ injection Inject 0.3 mLs (0.3 mg total) into the muscle as needed for anaphylaxis. 1 each 1  . escitalopram (LEXAPRO) 20 MG tablet TAKE 1 TABLET(20 MG) BY MOUTH DAILY 90 tablet 1  . Insulin Syringe-Needle U-100 (INSULIN SYRINGE 1CC/31GX5/16") 31G X 5/16" 1 ML MISC  USE FOR ALLERGIES TWO TIMES A WEEK 100 each 3  . montelukast (SINGULAIR) 10 MG tablet TAKE 1 TABLET(10 MG) BY MOUTH DAILY 90 tablet 3  . naproxen (NAPROSYN) 500 MG tablet Take 1 tablet (500 mg total) by mouth 2 (two) times daily with a meal. 60 tablet 1  . omeprazole (PRILOSEC) 40 MG capsule Take 1 capsule (40 mg total) by mouth in the morning and at bedtime. For 30 days. 180 capsule 1  . triamcinolone ointment (KENALOG) 0.1 % APPLY TO THE AFFECTED AREA LEG, ARM, AND TRUNK TWICE DAILY. AVOID USE ON FACE    . Vitamin D, Ergocalciferol, (DRISDOL) 50000 units CAPS capsule Take 50,000 Units by mouth once a week.  1  . zolpidem (AMBIEN) 10 MG tablet Take 1 tablet (10 mg total) by mouth at bedtime as needed for sleep. 90 tablet 2   No facility-administered medications prior to visit.    Allergies  Allergen Reactions  . Penicillins Anaphylaxis  . Sulfa Antibiotics Other (See Comments)    Bad taste in mouth    Review of Systems  Constitutional: Positive for fatigue. Negative for fever.  HENT: Positive for congestion, postnasal drip, rhinorrhea, sneezing and sore throat. Negative for ear pain, sinus pressure and sinus pain.   Eyes: Positive for itching. Negative for pain, discharge and redness.  Respiratory: Positive for cough.   Cardiovascular: Negative.   Gastrointestinal: Negative.   Genitourinary: Negative.   Musculoskeletal: Negative.   Skin: Negative.   Neurological: Negative.        Objective:    Physical Exam Vitals and nursing note reviewed.  Constitutional:      General: She is not in acute distress. HENT:     Head: Normocephalic.     Nose: Congestion present.  Eyes:     Conjunctiva/sclera: Conjunctivae normal.  Pulmonary:     Effort: Pulmonary effort is normal.     Comments: Able to talk in complete sentences Neurological:     Mental Status: She is alert and oriented to person, place, and time.  Psychiatric:        Mood and Affect: Mood normal.        Behavior:  Behavior normal.        Thought Content: Thought content normal.        Judgment: Judgment normal.     There were no vitals taken for this visit. Wt Readings from Last 3 Encounters:  06/27/20 189 lb (85.7 kg)  05/04/20 191 lb 3.2 oz (86.7 kg)  03/29/20 188 lb 6.4 oz (85.5 kg)    Health Maintenance Due  Topic Date Due  . COLONOSCOPY (Pts 45-71yrs Insurance coverage will need to be confirmed)  10/16/2019    There  are no preventive care reminders to display for this patient.   Lab Results  Component Value Date   TSH 2.370 07/27/2019   Lab Results  Component Value Date   WBC 5.5 07/27/2019   HGB 12.9 07/27/2019   HCT 39.1 07/27/2019   MCV 96 07/27/2019   PLT 266 07/27/2019   Lab Results  Component Value Date   NA 139 07/27/2019   K 4.3 07/27/2019   CO2 23 07/27/2019   GLUCOSE 96 07/27/2019   BUN 13 07/27/2019   CREATININE 0.73 07/27/2019   BILITOT <0.2 07/27/2019   ALKPHOS 70 07/27/2019   AST 16 07/27/2019   ALT 12 07/27/2019   PROT 6.9 07/27/2019   ALBUMIN 4.7 07/27/2019   CALCIUM 9.8 07/27/2019   ANIONGAP 6 (L) 03/04/2012   Lab Results  Component Value Date   CHOL 260 (H) 07/27/2019   Lab Results  Component Value Date   HDL 62 07/27/2019   Lab Results  Component Value Date   LDLCALC 149 (H) 07/27/2019   Lab Results  Component Value Date   TRIG 270 (H) 07/27/2019   No results found for: CHOLHDL No results found for: HGBA1C     Assessment & Plan:   Problem List Items Addressed This Visit      Respiratory   Allergic rhinitis - Primary    Recent allergy flare with the current season and weather. Goes to allergist and gets shots to help with symptoms. With recent, continued symptoms will treat with a prednisone taper. Continue supportive treatment including rest, fluids, singulair, zyrtec, inhaler, and benadryl. Follow-up if symptoms don't improve or worsen.           Meds ordered this encounter  Medications  . predniSONE (DELTASONE) 10 MG  tablet    Sig: Take 6 tablets today and tomorrow, then 5 tablets the next two days, then decrease by one tablet every other day until gone    Dispense:  42 tablet    Refill:  0    . This visit was completed via MyChart due to the restrictions of the COVID-19 pandemic. All issues as above were discussed and addressed. Physical exam was done as above through visual confirmation on MyChart. If it was felt that the patient should be evaluated in the office, they were directed there. The patient verbally consented to this visit. . Location of the patient: parking lot . Location of the provider: work . Those involved with this call:  . Provider: Billy Fischer, DNP . CMA: Yvonna Alanis, CMA . Front Desk/Registration: Levert Feinstein  . Time spent on call: 15 minutes with patient face to face via video conference. More than 50% of this time was spent in counseling and coordination of care. 10 minutes total spent in review of patient's record and preparation of their chart.   Charyl Dancer, NP

## 2020-07-01 IMAGING — US US THYROID
1 series · 14 of 25 positions shown · non-contrast
Comparison: None.

CLINICAL DATA: Other.  Dysphagia, possible thyromegaly

EXAM:
THYROID ULTRASOUND
TECHNIQUE: Ultrasound examination of the thyroid gland and adjacent soft
tissues was performed.

[Series 1: us thyroid · 0.07mm/px · 14 of 40 slices shown]
[im 1/40]
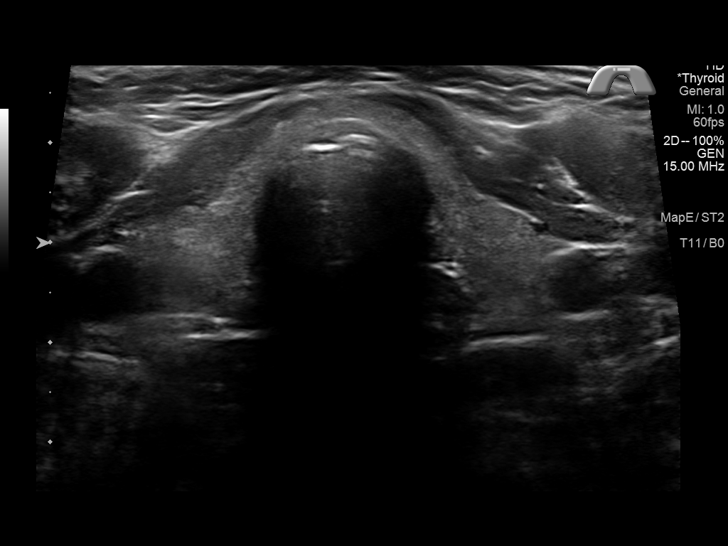
[im 4/40]
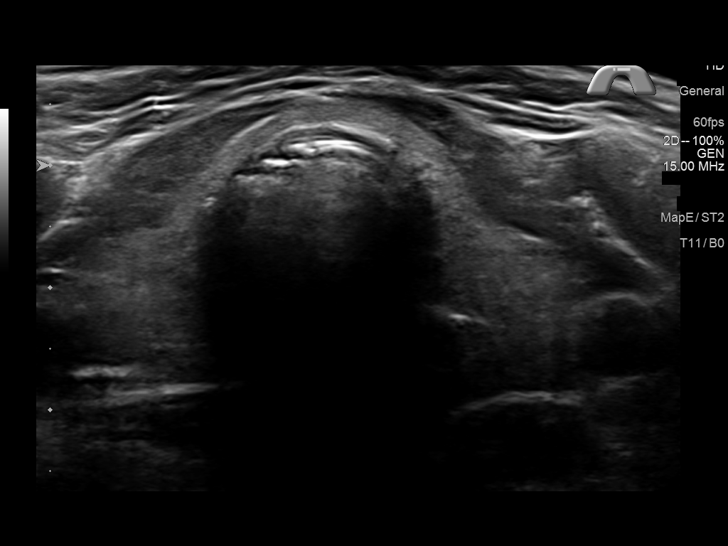
[im 7/40]
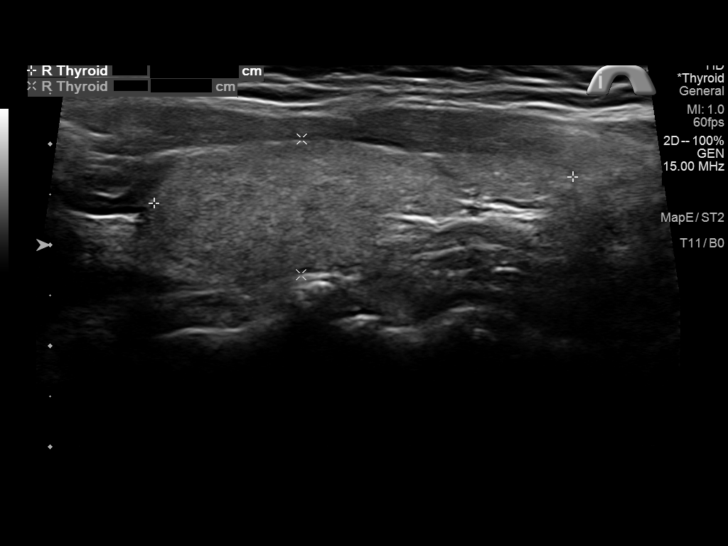
[im 10/40]
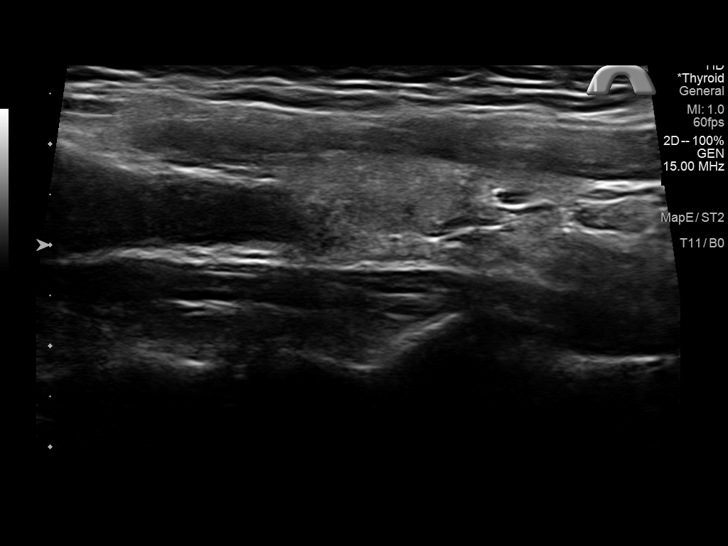
[im 14/40]
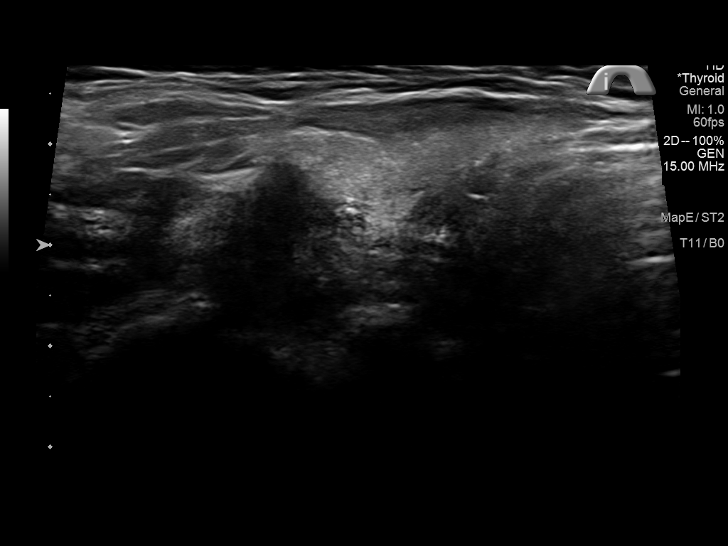
[im 15/40]
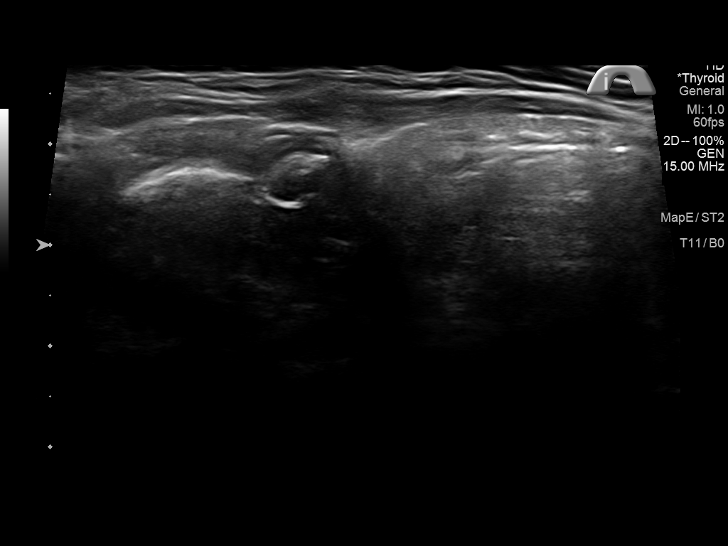
[im 18/40]
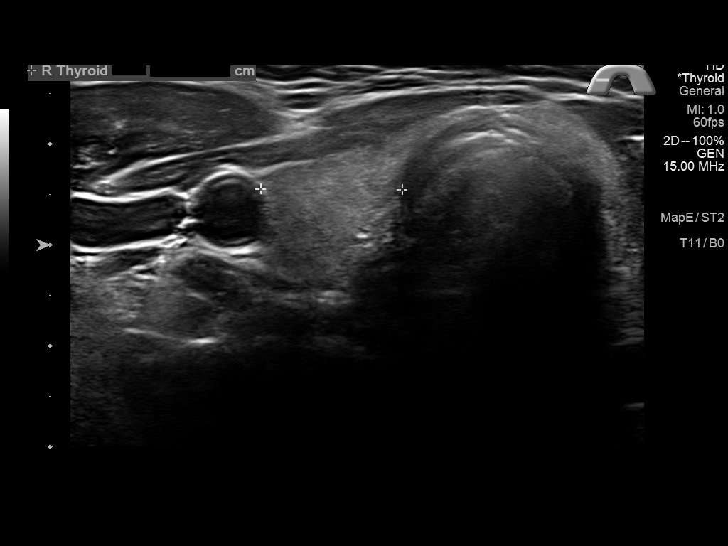
[im 22/40]
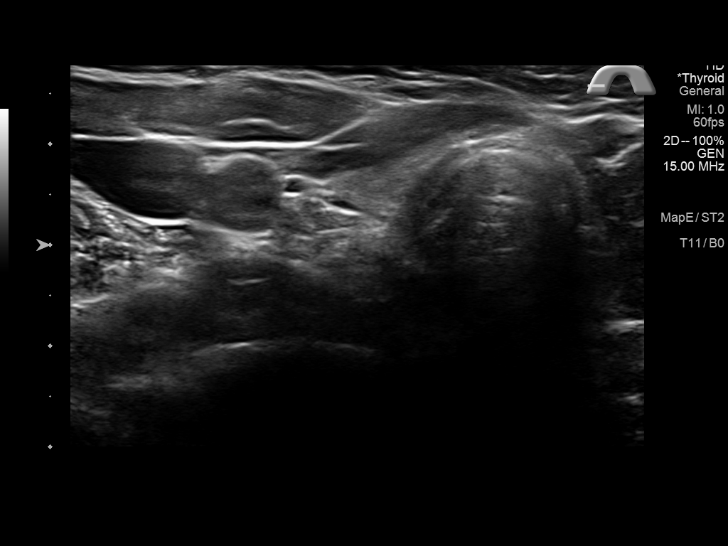
[im 25/40]
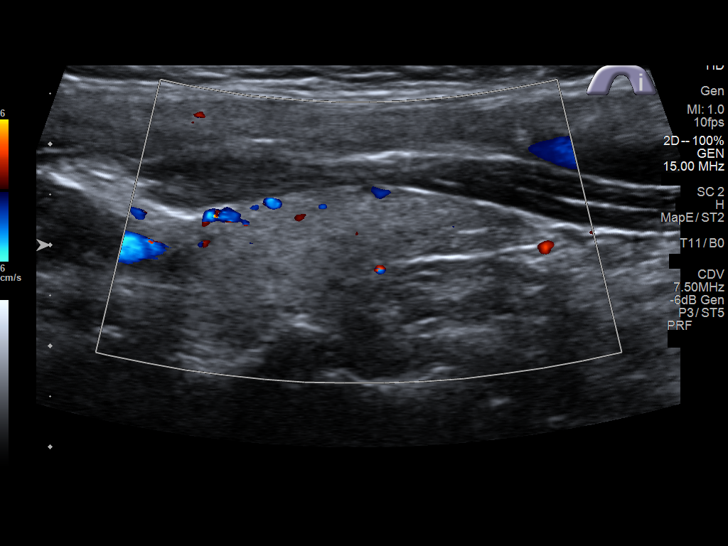
[im 27/40]
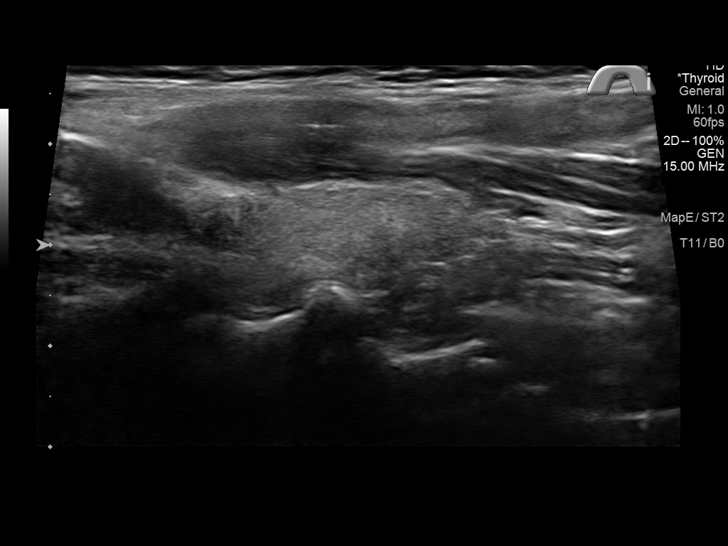
[im 30/40]
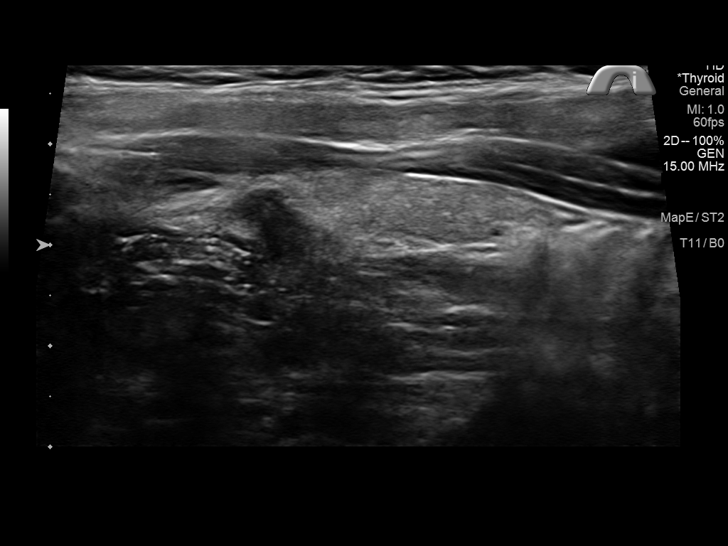
[im 33/40]
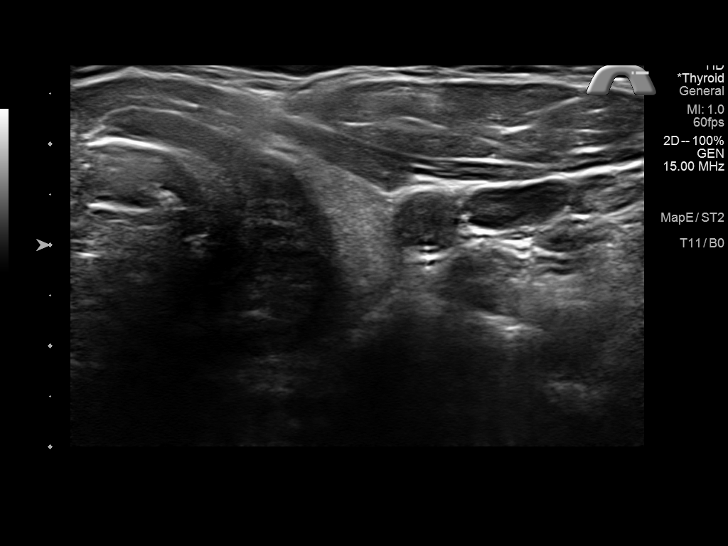
[im 36/40]
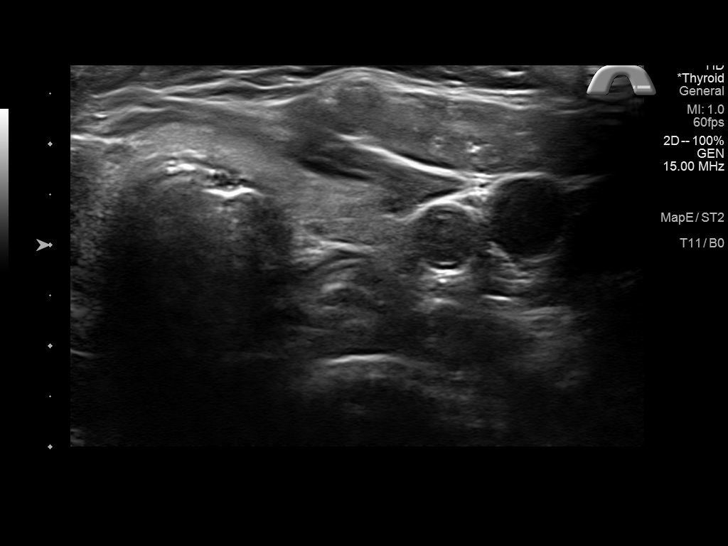
[im 40/40]
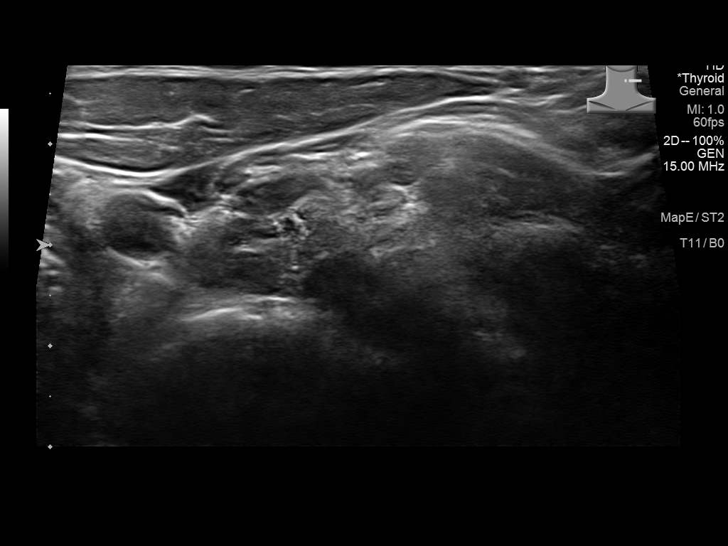

[14 of 25 positions shown; findings below may reference images not displayed]

FINDINGS: Parenchymal Echotexture: Normal

Isthmus: 0.3 cm

Right lobe: 4.2 x 1.3 x 1.4 cm

Left lobe: 3.2 x 1.0 x 1.3 cm

_________________________________________________________

Estimated total number of nodules >/= 1 cm: 0

Number of spongiform nodules >/=  2 cm not described below (TR1): 0

Number of mixed cystic and solid nodules >/= 1.5 cm not described
below (TR2): 0

_________________________________________________________

No discrete nodules are seen within the thyroid gland.
IMPRESSION: Normal sonographic appearance of the thyroid gland.

## 2020-07-05 ENCOUNTER — Other Ambulatory Visit: Payer: Self-pay | Admitting: Family Medicine

## 2020-07-05 NOTE — Telephone Encounter (Signed)
Requested Prescriptions  Pending Prescriptions Disp Refills  . naproxen (NAPROSYN) 500 MG tablet [Pharmacy Med Name: NAPROXEN 500MG  TABLETS] 60 tablet 1    Sig: TAKE 1 TABLET(500 MG) BY MOUTH TWICE DAILY WITH A MEAL     Analgesics:  NSAIDS Passed - 07/05/2020  3:19 AM      Passed - Cr in normal range and within 360 days    Creatinine  Date Value Ref Range Status  03/04/2012 0.73 0.60 - 1.30 mg/dL Final   Creatinine, Ser  Date Value Ref Range Status  07/27/2019 0.73 0.57 - 1.00 mg/dL Final         Passed - HGB in normal range and within 360 days    Hemoglobin  Date Value Ref Range Status  07/27/2019 12.9 11.1 - 15.9 g/dL Final         Passed - Patient is not pregnant      Passed - Valid encounter within last 12 months    Recent Outpatient Visits          6 days ago Seasonal allergic rhinitis due to pollen   [facility], Lauren A, NP   2 months ago Mid back pain   [employer], [name], DO   3 months ago Mild episode of recurrent major depressive disorder ([place])   [place], [name], DO   11 months ago Hand dermatitis   [place], [place], DO   11 months ago Esophageal dysphagia   [place], [place], DO      Future Appointments            In 2 months Wynetta Emery, Barb Merino, DO [employer]

## 2020-07-24 ENCOUNTER — Other Ambulatory Visit: Payer: Self-pay | Admitting: Internal Medicine

## 2020-07-24 DIAGNOSIS — G4733 Obstructive sleep apnea (adult) (pediatric): Secondary | ICD-10-CM

## 2020-07-24 DIAGNOSIS — Z9989 Dependence on other enabling machines and devices: Secondary | ICD-10-CM

## 2020-09-03 ENCOUNTER — Other Ambulatory Visit: Payer: Self-pay

## 2020-09-03 MED ORDER — DALIRESP 500 MCG PO TABS: 500.0000 ug | ORAL_TABLET | Freq: Every day | ORAL | 1 refills | Status: DC

## 2020-09-10 ENCOUNTER — Telehealth: Payer: Self-pay

## 2020-09-11 NOTE — Telephone Encounter (Signed)
Done

## 2020-09-12 ENCOUNTER — Other Ambulatory Visit: Payer: Self-pay

## 2020-09-12 ENCOUNTER — Ambulatory Visit (INDEPENDENT_AMBULATORY_CARE_PROVIDER_SITE_OTHER): Payer: BC Managed Care – PPO

## 2020-09-12 DIAGNOSIS — J301 Allergic rhinitis due to pollen: Secondary | ICD-10-CM

## 2020-09-13 DIAGNOSIS — J301 Allergic rhinitis due to pollen: Secondary | ICD-10-CM | POA: Diagnosis not present

## 2020-09-26 ENCOUNTER — Other Ambulatory Visit: Payer: Self-pay

## 2020-09-26 ENCOUNTER — Encounter: Payer: Self-pay | Admitting: Family Medicine

## 2020-09-26 ENCOUNTER — Ambulatory Visit (INDEPENDENT_AMBULATORY_CARE_PROVIDER_SITE_OTHER): Payer: BC Managed Care – PPO | Admitting: Family Medicine

## 2020-09-26 VITALS — BP 113/70 | HR 73 | Temp 98.3°F | Ht 67.5 in | Wt 190.0 lb

## 2020-09-26 DIAGNOSIS — E782 Mixed hyperlipidemia: Secondary | ICD-10-CM | POA: Diagnosis not present

## 2020-09-26 DIAGNOSIS — F419 Anxiety disorder, unspecified: Secondary | ICD-10-CM | POA: Diagnosis not present

## 2020-09-26 DIAGNOSIS — E538 Deficiency of other specified B group vitamins: Secondary | ICD-10-CM | POA: Diagnosis not present

## 2020-09-26 DIAGNOSIS — F330 Major depressive disorder, recurrent, mild: Secondary | ICD-10-CM | POA: Diagnosis not present

## 2020-09-26 DIAGNOSIS — Z0000 Encounter for general adult medical examination without abnormal findings: Secondary | ICD-10-CM | POA: Diagnosis not present

## 2020-09-26 DIAGNOSIS — Z1211 Encounter for screening for malignant neoplasm of colon: Secondary | ICD-10-CM

## 2020-09-26 LAB — URINALYSIS, ROUTINE W REFLEX MICROSCOPIC
Bilirubin, UA: NEGATIVE
Glucose, UA: NEGATIVE
Ketones, UA: NEGATIVE
Leukocytes,UA: NEGATIVE
Nitrite, UA: NEGATIVE
Specific Gravity, UA: 1.025 (ref 1.005–1.030)
Urobilinogen, Ur: 0.2 mg/dL (ref 0.2–1.0)
pH, UA: 6 (ref 5.0–7.5)

## 2020-09-26 LAB — MICROSCOPIC EXAMINATION: WBC, UA: NONE SEEN /hpf (ref 0–5)

## 2020-09-26 MED ORDER — ALBUTEROL SULFATE HFA 108 (90 BASE) MCG/ACT IN AERS: 1.0000 | INHALATION_SPRAY | RESPIRATORY_TRACT | 6 refills | Status: DC | PRN

## 2020-09-26 MED ORDER — BUPROPION HCL ER (XL) 300 MG PO TB24: ORAL_TABLET | ORAL | 1 refills | Status: DC

## 2020-09-26 MED ORDER — ESCITALOPRAM OXALATE 20 MG PO TABS: 20.0000 mg | ORAL_TABLET | Freq: Every day | ORAL | 1 refills | Status: DC

## 2020-09-26 MED ORDER — LORAZEPAM 0.5 MG PO TABS: 0.5000 mg | ORAL_TABLET | Freq: Every day | ORAL | 0 refills | Status: DC | PRN

## 2020-09-26 MED ORDER — OMEPRAZOLE 40 MG PO CPDR: 40.0000 mg | DELAYED_RELEASE_CAPSULE | Freq: Two times a day (BID) | ORAL | 1 refills | Status: DC

## 2020-09-26 MED ORDER — NAPROXEN 500 MG PO TABS: ORAL_TABLET | ORAL | 1 refills | Status: DC

## 2020-09-26 MED ORDER — MONTELUKAST SODIUM 10 MG PO TABS: ORAL_TABLET | ORAL | 3 refills | Status: DC

## 2020-09-26 MED ORDER — CYCLOBENZAPRINE HCL 10 MG PO TABS: 10.0000 mg | ORAL_TABLET | Freq: Three times a day (TID) | ORAL | 1 refills | Status: DC | PRN

## 2020-09-26 NOTE — Assessment & Plan Note (Signed)
In exacerbation. Will continue meds and add PRN lorazepam. 30 pills should last 6 months. Call with any concerns or if getting worse. Continue to monitor.

## 2020-09-26 NOTE — Assessment & Plan Note (Signed)
Labs checked today. Await results.

## 2020-09-26 NOTE — Assessment & Plan Note (Signed)
Depression doing OK. Call with any concerns. Continue to monitor. Refills given today.

## 2020-09-26 NOTE — Progress Notes (Signed)
BP 113/70   Pulse 73   Temp 98.3 F (36.8 C) (Oral)   Ht 5' 7.5" (1.715 m)   Wt 190 lb (86.2 kg)   SpO2 97%   BMI 29.32 kg/m    Subjective:    Patient ID: [name], female    DOB: 1971-02-01, 50 y.o.   MRN: [id number]  HPI: [name] is a 50 y.o. female presenting on 09/26/2020 for comprehensive medical examination. Current medical complaints include:  DEPRESSION- taking lorazepam about 1x a week Mood status: exacerbated Satisfied with current treatment?: unsure- anxiety has been a lot more for about the past 2-3 months. Currently the age her mother was when she died and just had a new granddaughter Symptom severity: moderate  Duration of current treatment : chronic Side effects: no Medication compliance: excellent compliance Psychotherapy/counseling: no  Depressed mood: yes Anxious mood: yes Anhedonia: no Significant weight loss or gain: no Insomnia: no  Fatigue: yes Feelings of worthlessness or guilt: no Impaired concentration/indecisiveness: no Suicidal ideations: no Hopelessness: no Crying spells: no Depression screen [facility] 2/9 09/26/2020 07/14/2019 02/15/2019 07/20/2018 04/19/2018  Decreased Interest 0 0 0 0 0  Down, Depressed, Hopeless 0 0 0 0 0  PHQ - 2 Score 0 0 0 0 0  Altered sleeping 1 0 - 1 1  Tired, decreased energy 1 0 - 1 1  Change in appetite 0 0 - 0 0  Feeling bad or failure about yourself  0 0 - 0 0  Trouble concentrating 0 0 - 0 0  Moving slowly or fidgety/restless 0 0 - 0 0  Suicidal thoughts 0 0 - 0 0  PHQ-9 Score 2 0 - 2 2  Difficult doing work/chores Not difficult at all Not difficult at all - Not difficult at all Not difficult at all    HYPERLIPIDEMIA Hyperlipidemia status: stable Satisfied with current treatment?  yes Side effects:  not on anything Supplements: none Aspirin:  no The 10-year ASCVD risk score Mikey Bussing DC Jr., et al., 2013) is: 1.4%   Values used to calculate the score:     Age: 57 years     Sex: Female     Is  Non-Hispanic African American: No     Diabetic: No     Tobacco smoker: No     Systolic Blood Pressure: 123456 mmHg     Is BP treated: No     HDL Cholesterol: 62 mg/dL     Total Cholesterol: 260 mg/dL Chest pain:  no Coronary artery disease:  no  She currently lives with: husband Menopausal Symptoms: no  Depression Screen done today and results listed below:  Depression screen [facility] 2/9 09/26/2020 07/14/2019 02/15/2019 07/20/2018 04/19/2018  Decreased Interest 0 0 0 0 0  Down, Depressed, Hopeless 0 0 0 0 0  PHQ - 2 Score 0 0 0 0 0  Altered sleeping 1 0 - 1 1  Tired, decreased energy 1 0 - 1 1  Change in appetite 0 0 - 0 0  Feeling bad or failure about yourself  0 0 - 0 0  Trouble concentrating 0 0 - 0 0  Moving slowly or fidgety/restless 0 0 - 0 0  Suicidal thoughts 0 0 - 0 0  PHQ-9 Score 2 0 - 2 2  Difficult doing work/chores Not difficult at all Not difficult at all - Not difficult at all Not difficult at all    Past Medical History:  Past Medical History:  Diagnosis Date   Allergic  rhinitis    Anxiety    Asthma    Constipation    COPD (chronic obstructive pulmonary disease) (HCC)    Depression    Hematuria    Insomnia    Reflux     Surgical History:  Past Surgical History:  Procedure Laterality Date   ABDOMINAL HYSTERECTOMY     APPENDECTOMY     ESOPHAGOGASTRODUODENOSCOPY (EGD) WITH PROPOFOL N/A 06/14/2019   Procedure: ESOPHAGOGASTRODUODENOSCOPY (EGD) WITH PROPOFOL;  Surgeon: Lucilla Lame, MD;  Location: [facility] ENDOSCOPY;  Service: Endoscopy;  Laterality: N/A;   OVARIAN CYST DRAINAGE Right Nov. 2003   TONSILLECTOMY     TUBAL LIGATION      Medications:  Current Outpatient Medications on File Prior to Visit  Medication Sig   cetirizine (ZYRTEC) 10 MG tablet Take 10 mg by mouth daily.   EPINEPHrine (EPIPEN 2-PAK) 0.3 mg/0.3 mL IJ SOAJ injection Inject 0.3 mLs (0.3 mg total) into the muscle as needed for anaphylaxis.   Insulin Syringe-Needle U-100 (INSULIN SYRINGE  1CC/31GX5/16") 31G X 5/16" 1 ML MISC USE FOR ALLERGIES TWO TIMES A WEEK   roflumilast (DALIRESP) 500 MCG TABS tablet Take 1 tablet (500 mcg total) by mouth daily.   triamcinolone ointment (KENALOG) 0.1 % APPLY TO THE AFFECTED AREA LEG, ARM, AND TRUNK TWICE DAILY. AVOID USE ON FACE   Vitamin D, Ergocalciferol, (DRISDOL) 50000 units CAPS capsule Take 50,000 Units by mouth once a week.   zolpidem (AMBIEN) 10 MG tablet TAKE ONE TABLET BY MOUTH AT BEDTIME AS NEEDED FOR SLEEP   No current facility-administered medications on file prior to visit.    Allergies:  Allergies  Allergen Reactions   Penicillins Anaphylaxis   Sulfa Antibiotics Other (See Comments)    Bad taste in mouth    Social History:  Social History   Socioeconomic History   Marital status: Married    Spouse name: Not on file   Number of children: Not on file   Years of education: Not on file   Highest education level: Not on file  Occupational History   Not on file  Tobacco Use   Smoking status: Never   Smokeless tobacco: Never  Vaping Use   Vaping Use: Never used  Substance and Sexual Activity   Alcohol use: Yes    Comment: occasional   Drug use: No   Sexual activity: Yes  Other Topics Concern   Not on file  Social History Narrative   Not on file   Social Determinants of Health   Financial Resource Strain: Not on file  Food Insecurity: Not on file  Transportation Needs: Not on file  Physical Activity: Not on file  Stress: Not on file  Social Connections: Not on file  Intimate Partner Violence: Not on file   Social History   Tobacco Use  Smoking Status Never  Smokeless Tobacco Never   Social History   Substance and Sexual Activity  Alcohol Use Yes   Comment: occasional    Family History:  Family History  Problem Relation Age of Onset   Cancer Mother        ovarian   Stroke Father    Cancer Maternal Uncle        lung   Diabetes Maternal Grandmother    Emphysema Maternal Grandfather     Diabetes Paternal Grandmother    Stroke Paternal Grandfather    Breast cancer Neg Hx     Past medical history, surgical history, medications, allergies, family history and social history reviewed with patient  today and changes made to appropriate areas of the chart.   Review of Systems  Constitutional: Negative.   HENT: Negative.    Eyes: Negative.   Respiratory: Negative.    Cardiovascular: Negative.   Gastrointestinal: Negative.   Genitourinary: Negative.   Musculoskeletal: Negative.   Skin: Negative.   Neurological: Negative.   Endo/Heme/Allergies: Negative.   Psychiatric/Behavioral:  Negative for depression, hallucinations, memory loss, substance abuse and suicidal ideas. The patient is nervous/anxious. The patient does not have insomnia.   All other ROS negative except what is listed above and in the HPI.      Objective:    BP 113/70   Pulse 73   Temp 98.3 F (36.8 C) (Oral)   Ht 5' 7.5" (1.715 m)   Wt 190 lb (86.2 kg)   SpO2 97%   BMI 29.32 kg/m   Wt Readings from Last 3 Encounters:  09/26/20 190 lb (86.2 kg)  06/27/20 189 lb (85.7 kg)  05/04/20 191 lb 3.2 oz (86.7 kg)    Physical Exam Vitals and nursing note reviewed.  Constitutional:      General: She is not in acute distress.    Appearance: Normal appearance. She is not ill-appearing, toxic-appearing or diaphoretic.  HENT:     Head: Normocephalic and atraumatic.     Right Ear: Tympanic membrane, ear canal and external ear normal. There is no impacted cerumen.     Left Ear: Tympanic membrane, ear canal and external ear normal. There is no impacted cerumen.     Nose: Nose normal. No congestion or rhinorrhea.     Mouth/Throat:     Mouth: Mucous membranes are moist.     Pharynx: Oropharynx is clear. No oropharyngeal exudate or posterior oropharyngeal erythema.  Eyes:     General: No scleral icterus.       Right eye: No discharge.        Left eye: No discharge.     Extraocular Movements: Extraocular  movements intact.     Conjunctiva/sclera: Conjunctivae normal.     Pupils: Pupils are equal, round, and reactive to light.  Neck:     Vascular: No carotid bruit.  Cardiovascular:     Rate and Rhythm: Normal rate and regular rhythm.     Pulses: Normal pulses.     Heart sounds: No murmur heard.   No friction rub. No gallop.  Pulmonary:     Effort: Pulmonary effort is normal. No respiratory distress.     Breath sounds: Normal breath sounds. No stridor. No wheezing, rhonchi or rales.  Chest:     Chest wall: No tenderness.  Abdominal:     General: Abdomen is flat. Bowel sounds are normal. There is no distension.     Palpations: Abdomen is soft. There is no mass.     Tenderness: There is no abdominal tenderness. There is no right CVA tenderness, left CVA tenderness, guarding or rebound.     Hernia: No hernia is present.  Genitourinary:    Comments: Breast and pelvic exams deferred with shared decision making Musculoskeletal:        General: No swelling, tenderness, deformity or signs of injury.     Cervical back: Normal range of motion and neck supple. No rigidity. No muscular tenderness.     Right lower leg: No edema.     Left lower leg: No edema.  Lymphadenopathy:     Cervical: No cervical adenopathy.  Skin:    General: Skin is warm and dry.  Capillary Refill: Capillary refill takes less than 2 seconds.     Coloration: Skin is not jaundiced or pale.     Findings: No bruising, erythema, lesion or rash.  Neurological:     General: No focal deficit present.     Mental Status: She is alert and oriented to person, place, and time. Mental status is at baseline.     Cranial Nerves: No cranial nerve deficit.     Sensory: No sensory deficit.     Motor: No weakness.     Coordination: Coordination normal.     Gait: Gait normal.     Deep Tendon Reflexes: Reflexes normal.  Psychiatric:        Mood and Affect: Mood normal.        Behavior: Behavior normal.        Thought Content:  Thought content normal.        Judgment: Judgment normal.    Results for orders placed or performed in visit on 07/27/19  CBC with Differential/Platelet  Result Value Ref Range   WBC 5.5 3.4 - 10.8 x10E3/uL   RBC 4.07 3.77 - 5.28 x10E6/uL   Hemoglobin 12.9 11.1 - 15.9 g/dL   Hematocrit 39.1 34.0 - 46.6 %   MCV 96 79 - 97 fL   MCH 31.7 26.6 - 33.0 pg   MCHC 33.0 31.5 - 35.7 g/dL   RDW 12.7 11.7 - 15.4 %   Platelets 266 150 - 450 x10E3/uL   Neutrophils 46 Not Estab. %   Lymphs 37 Not Estab. %   Monocytes 8 Not Estab. %   Eos 7 Not Estab. %   Basos 2 Not Estab. %   Neutrophils Absolute 2.6 1.4 - 7.0 x10E3/uL   Lymphocytes Absolute 2.0 0.7 - 3.1 x10E3/uL   Monocytes Absolute 0.4 0.1 - 0.9 x10E3/uL   EOS (ABSOLUTE) 0.4 0.0 - 0.4 x10E3/uL   Basophils Absolute 0.1 0.0 - 0.2 x10E3/uL   Immature Granulocytes 0 Not Estab. %   Immature Grans (Abs) 0.0 0.0 - 0.1 x10E3/uL  Comprehensive metabolic panel  Result Value Ref Range   Glucose 96 65 - 99 mg/dL   BUN 13 6 - 24 mg/dL   Creatinine, Ser 0.73 0.57 - 1.00 mg/dL   GFR calc non Af Amer 96 >59 mL/min/1.73   GFR calc Af Amer 111 >59 mL/min/1.73   BUN/Creatinine Ratio 18 9 - 23   Sodium 139 134 - 144 mmol/L   Potassium 4.3 3.5 - 5.2 mmol/L   Chloride 101 96 - 106 mmol/L   CO2 23 20 - 29 mmol/L   Calcium 9.8 8.7 - 10.2 mg/dL   Total Protein 6.9 6.0 - 8.5 g/dL   Albumin 4.7 3.8 - 4.8 g/dL   Globulin, Total 2.2 1.5 - 4.5 g/dL   Albumin/Globulin Ratio 2.1 1.2 - 2.2   Bilirubin Total <0.2 0.0 - 1.2 mg/dL   Alkaline Phosphatase 70 48 - 121 IU/L   AST 16 0 - 40 IU/L   ALT 12 0 - 32 IU/L  TSH  Result Value Ref Range   TSH 2.370 0.450 - 4.500 uIU/mL  VITAMIN D 25 Hydroxy (Vit-D Deficiency, Fractures)  Result Value Ref Range   Vit D, 25-Hydroxy 43.6 30.0 - 100.0 ng/mL  Lipid Panel w/o Chol/HDL Ratio  Result Value Ref Range   Cholesterol, Total 260 (H) 100 - 199 mg/dL   Triglycerides 270 (H) 0 - 149 mg/dL   HDL 62 >39 mg/dL   VLDL  Cholesterol Cal 49 (  H) 5 - 40 mg/dL   LDL Chol Calc (NIH) 149 (H) 0 - 99 mg/dL  B12  Result Value Ref Range   Vitamin B-12 1,430 (H) 232 - 1,245 pg/mL      Assessment & Plan:   Problem List Items Addressed This Visit       Other   Depression    Depression doing OK. Call with any concerns. Continue to monitor. Refills given today.       Relevant Medications   LORazepam (ATIVAN) 0.5 MG tablet   escitalopram (LEXAPRO) 20 MG tablet   buPROPion (WELLBUTRIN XL) 300 MG 24 hr tablet   Anxiety    In exacerbation. Will continue meds and add PRN lorazepam. 30 pills should last 6 months. Call with any concerns or if getting worse. Continue to monitor.        Relevant Medications   LORazepam (ATIVAN) 0.5 MG tablet   escitalopram (LEXAPRO) 20 MG tablet   buPROPion (WELLBUTRIN XL) 300 MG 24 hr tablet   Vitamin B12 deficiency (non anemic)    Labs checked today. Await results.        Relevant Orders   B12   Hyperlipidemia, mixed    Labs checked today. Await results.        Relevant Orders   Lipid Panel w/o Chol/HDL Ratio   Other Visit Diagnoses     Routine general medical examination at a health care facility    -  Primary   Vaccines up to date. Screening labs checked today. Pap and mammo up to date. Continue diet and exercise. Call with any concerns.    Relevant Orders   CBC with Differential/Platelet   Comprehensive metabolic panel   Lipid Panel w/o Chol/HDL Ratio   Urinalysis, Routine w reflex microscopic   TSH   Hepatitis C Antibody   Screening for colon cancer       Cologuard ordered today.   Relevant Orders   Cologuard        Follow up plan: Return in about 6 months (around 03/29/2021).   LABORATORY TESTING:  - Pap smear: up to date  IMMUNIZATIONS:   - Tdap: Tetanus vaccination status reviewed: last tetanus booster within 10 years. - Influenza: Postponed to flu season - Pneumovax: Up to date - Prevnar: Not applicable - HPV: Up to date - Zostavax  vaccine: Refused  SCREENING: -Mammogram: Up to date  - Colonoscopy: Up to date   PATIENT COUNSELING:   Advised to take 1 mg of folate supplement per day if capable of pregnancy.   Sexuality: Discussed sexually transmitted diseases, partner selection, use of condoms, avoidance of unintended pregnancy  and contraceptive alternatives.   Advised to avoid cigarette smoking.  I discussed with the patient that most people either abstain from alcohol or drink within safe limits (<=14/week and <=4 drinks/occasion for males, <=7/weeks and <= 3 drinks/occasion for females) and that the risk for alcohol disorders and other health effects rises proportionally with the number of drinks per week and how often a drinker exceeds daily limits.  Discussed cessation/primary prevention of drug use and availability of treatment for abuse.   Diet: Encouraged to adjust caloric intake to maintain  or achieve ideal body weight, to reduce intake of dietary saturated fat and total fat, to limit sodium intake by avoiding high sodium foods and not adding table salt, and to maintain adequate dietary potassium and calcium preferably from fresh fruits, vegetables, and low-fat dairy products.    stressed the importance of regular  exercise  Injury prevention: Discussed safety belts, safety helmets, smoke detector, smoking near bedding or upholstery.   Dental health: Discussed importance of regular tooth brushing, flossing, and dental visits.    NEXT PREVENTATIVE PHYSICAL DUE IN 1 YEAR. Return in about 6 months (around 03/29/2021).

## 2020-09-27 LAB — CBC WITH DIFFERENTIAL/PLATELET
Basophils Absolute: 0.1 10*3/uL (ref 0.0–0.2)
Basos: 1 %
EOS (ABSOLUTE): 0.2 10*3/uL (ref 0.0–0.4)
Eos: 3 %
Hematocrit: 37.4 % (ref 34.0–46.6)
Hemoglobin: 12.7 g/dL (ref 11.1–15.9)
Immature Grans (Abs): 0 10*3/uL (ref 0.0–0.1)
Immature Granulocytes: 0 %
Lymphocytes Absolute: 2.2 10*3/uL (ref 0.7–3.1)
Lymphs: 38 %
MCH: 31.7 pg (ref 26.6–33.0)
MCHC: 34 g/dL (ref 31.5–35.7)
MCV: 93 fL (ref 79–97)
Monocytes Absolute: 0.4 10*3/uL (ref 0.1–0.9)
Monocytes: 6 %
Neutrophils Absolute: 2.8 10*3/uL (ref 1.4–7.0)
Neutrophils: 52 %
Platelets: 252 10*3/uL (ref 150–450)
RBC: 4.01 x10E6/uL (ref 3.77–5.28)
RDW: 12.1 % (ref 11.7–15.4)
WBC: 5.6 10*3/uL (ref 3.4–10.8)

## 2020-09-27 LAB — COMPREHENSIVE METABOLIC PANEL
ALT: 13 IU/L (ref 0–32)
AST: 13 IU/L (ref 0–40)
Albumin/Globulin Ratio: 2.2 (ref 1.2–2.2)
Albumin: 4.9 g/dL (ref 3.8–4.9)
Alkaline Phosphatase: 71 IU/L (ref 44–121)
BUN/Creatinine Ratio: 19 (ref 9–23)
BUN: 11 mg/dL (ref 6–24)
Bilirubin Total: 0.3 mg/dL (ref 0.0–1.2)
CO2: 24 mmol/L (ref 20–29)
Calcium: 9.8 mg/dL (ref 8.7–10.2)
Chloride: 101 mmol/L (ref 96–106)
Creatinine, Ser: 0.58 mg/dL (ref 0.57–1.00)
Globulin, Total: 2.2 g/dL (ref 1.5–4.5)
Glucose: 99 mg/dL (ref 65–99)
Potassium: 4.4 mmol/L (ref 3.5–5.2)
Sodium: 141 mmol/L (ref 134–144)
Total Protein: 7.1 g/dL (ref 6.0–8.5)
eGFR: 109 mL/min/{1.73_m2} (ref >59)

## 2020-09-27 LAB — LIPID PANEL W/O CHOL/HDL RATIO
Cholesterol, Total: 247 mg/dL — ABNORMAL HIGH (ref 100–199)
HDL: 60 mg/dL (ref >39)
LDL Chol Calc (NIH): 128 mg/dL — ABNORMAL HIGH (ref 0–99)
Triglycerides: 336 mg/dL — ABNORMAL HIGH (ref 0–149)
VLDL Cholesterol Cal: 59 mg/dL — ABNORMAL HIGH (ref 5–40)

## 2020-09-27 LAB — TSH: TSH: 2.13 u[IU]/mL (ref 0.450–4.500)

## 2020-09-27 LAB — VITAMIN B12: Vitamin B-12: 487 pg/mL (ref 232–1245)

## 2020-09-27 LAB — HEPATITIS C ANTIBODY: Hep C Virus Ab: <0.1 s/co ratio (ref 0.0–0.9)

## 2020-09-29 ENCOUNTER — Other Ambulatory Visit: Payer: Self-pay | Admitting: Family Medicine

## 2020-09-29 NOTE — Telephone Encounter (Signed)
last RF 09/26/20 #90 1 RF

## 2020-10-01 DIAGNOSIS — Z1211 Encounter for screening for malignant neoplasm of colon: Secondary | ICD-10-CM | POA: Diagnosis not present

## 2020-10-05 LAB — COLOGUARD: Cologuard: NEGATIVE

## 2020-10-22 ENCOUNTER — Other Ambulatory Visit: Payer: Self-pay | Admitting: Internal Medicine

## 2020-10-22 DIAGNOSIS — Z9989 Dependence on other enabling machines and devices: Secondary | ICD-10-CM

## 2020-10-22 DIAGNOSIS — G4733 Obstructive sleep apnea (adult) (pediatric): Secondary | ICD-10-CM

## 2020-10-22 MED ORDER — ZOLPIDEM TARTRATE 10 MG PO TABS: 10.0000 mg | ORAL_TABLET | Freq: Every evening | ORAL | 0 refills | Status: DC | PRN

## 2020-10-23 ENCOUNTER — Telehealth: Payer: Self-pay

## 2020-10-23 ENCOUNTER — Other Ambulatory Visit: Payer: Self-pay

## 2020-10-23 DIAGNOSIS — G4733 Obstructive sleep apnea (adult) (pediatric): Secondary | ICD-10-CM

## 2020-10-23 MED ORDER — ZOLPIDEM TARTRATE 10 MG PO TABS: 10.0000 mg | ORAL_TABLET | Freq: Every evening | ORAL | 0 refills | Status: DC | PRN

## 2020-10-23 NOTE — Telephone Encounter (Signed)
Called [facility] and canceled pres for zolpidem '10mg'$  due to pt used harris tetter and called in harris tetter ambien 10 mg #30 with no refills as per dfk

## 2020-11-12 ENCOUNTER — Ambulatory Visit: Payer: BC Managed Care – PPO | Admitting: Internal Medicine

## 2020-11-12 ENCOUNTER — Encounter: Payer: Self-pay | Admitting: Internal Medicine

## 2020-11-12 ENCOUNTER — Other Ambulatory Visit: Payer: Self-pay

## 2020-11-12 VITALS — BP 128/82 | HR 92 | Temp 98.3°F | Resp 16 | Ht 68.0 in | Wt 191.4 lb

## 2020-11-12 DIAGNOSIS — J301 Allergic rhinitis due to pollen: Secondary | ICD-10-CM | POA: Diagnosis not present

## 2020-11-12 DIAGNOSIS — J4530 Mild persistent asthma, uncomplicated: Secondary | ICD-10-CM | POA: Diagnosis not present

## 2020-11-12 DIAGNOSIS — G4733 Obstructive sleep apnea (adult) (pediatric): Secondary | ICD-10-CM

## 2020-11-12 DIAGNOSIS — Z9989 Dependence on other enabling machines and devices: Secondary | ICD-10-CM

## 2020-11-12 DIAGNOSIS — Z6829 Body mass index (BMI) 29.0-29.9, adult: Secondary | ICD-10-CM

## 2020-11-12 MED ORDER — DALIRESP 500 MCG PO TABS: 500.0000 ug | ORAL_TABLET | Freq: Every day | ORAL | 3 refills | Status: DC

## 2020-11-12 MED ORDER — "INSULIN SYRINGE 31G X 5/16"" 1 ML MISC": 5 refills | Status: DC

## 2020-11-12 MED ORDER — ZOLPIDEM TARTRATE 10 MG PO TABS: 10.0000 mg | ORAL_TABLET | Freq: Every evening | ORAL | 3 refills | Status: DC | PRN

## 2020-11-12 MED ORDER — "INSULIN SYRINGE 31G X 5/16"" 1 ML MISC": 3 refills | Status: DC

## 2020-11-12 NOTE — Patient Instructions (Signed)
Asthma, Adult Asthma is a long-term (chronic) condition in which the airways get tight and narrow. The airways are the breathing passages that lead from the nose and mouth down into the lungs. A person with asthma will have times when symptoms get worse. These are called asthma attacks. They can cause coughing, whistling sounds when you breathe (wheezing), shortness of breath, and chest pain. They can make it hard to breathe. There is no cure for asthma, but medicines and lifestyle changes can help control it. There are many things that can bring on an asthma attack or make asthma symptoms worse (triggers). Common triggers include: Mold. Dust. Cigarette smoke. Cockroaches. Things that can cause allergy symptoms (allergens). These include animal skin flakes (dander) and pollen from trees or grass. Things that pollute the air. These may include household cleaners, wood smoke, smog, or chemical odors. Cold air, weather changes, and wind. Crying or laughing hard. Stress. Certain medicines or drugs. Certain foods such as dried fruit, potato chips, and grape juice. Infections, such as a cold or the flu. Certain medical conditions or diseases. Exercise or tiring activities. Asthma may be treated with medicines and by staying away from the things that cause asthma attacks. Types of medicines may include: Controller medicines. These help prevent asthma symptoms. They are usually taken every day. Fast-acting reliever or rescue medicines. These quickly relieve asthma symptoms. They are used as needed and provide short-term relief. Allergy medicines if your attacks are brought on by allergens. Medicines to help control the body's defense (immune) system. Follow these instructions at home: Avoiding triggers in your home Change your heating and air conditioning filter often. Limit your use of fireplaces and wood stoves. Get rid of pests (such as roaches and mice) and their droppings. Throw away plants  if you see mold on them. Clean your floors. Dust regularly. Use cleaning products that do not smell. Have someone vacuum when you are not home. Use a vacuum cleaner with a HEPA filter if possible. Replace carpet with wood, tile, or vinyl flooring. Carpet can trap animal skin flakes and dust. Use allergy-proof pillows, mattress covers, and box spring covers. Wash bed sheets and blankets every week in hot water. Dry them in a dryer. Keep your bedroom free of any triggers. Avoid pets and keep windows closed when things that cause allergy symptoms are in the air. Use blankets that are made of polyester or cotton. Clean bathrooms and kitchens with bleach. If possible, have someone repaint the walls in these rooms with mold-resistant paint. Keep out of the rooms that are being cleaned and painted. Wash your hands often with soap and water. If soap and water are not available, use hand sanitizer. Do not allow anyone to smoke in your home. General instructions Take over-the-counter and prescription medicines only as told by your doctor. Talk with your doctor if you have questions about how or when to take your medicines. Make note if you need to use your medicines more often than usual. Do not use any products that contain nicotine or tobacco, such as cigarettes and e-cigarettes. If you need help quitting, ask your doctor. Stay away from secondhand smoke. Avoid doing things outdoors when allergen counts are high and when air quality is low. Wear a ski mask when doing outdoor activities in the winter. The mask should cover your nose and mouth. Exercise indoors on cold days if you can. Warm up before you exercise. Take time to cool down after exercise. Use a peak flow meter as  told by your doctor. A peak flow meter is a tool that measures how well the lungs are working. Keep track of the peak flow meter's readings. Write them down. Follow your asthma action plan. This is a written plan for taking care  of your asthma and treating your attacks. Make sure you get all the shots (vaccines) that your doctor recommends. Ask your doctor about a flu shot and a pneumonia shot. Keep all follow-up visits as told by your doctor. This is important. Contact a doctor if: You have wheezing, shortness of breath, or a cough even while taking medicine to prevent attacks. The mucus you cough up (sputum) is thicker than usual. The mucus you cough up changes from clear or white to yellow, green, gray, or bloody. You have problems from the medicine you are taking, such as: A rash. Itching. Swelling. Trouble breathing. You need reliever medicines more than 2-3 times a week. Your peak flow reading is still at 50-79% of your personal best after following the action plan for 1 hour. You have a fever. Get help right away if: You seem to be worse and are not responding to medicine during an asthma attack. You are short of breath even at rest. You get short of breath when doing very little activity. You have trouble eating, drinking, or talking. You have chest pain or tightness. You have a fast heartbeat. Your lips or fingernails start to turn blue. You are light-headed or dizzy, or you faint. Your peak flow is less than 50% of your personal best. You feel too tired to breathe normally. Summary Asthma is a long-term (chronic) condition in which the airways get tight and narrow. An asthma attack can make it hard to breathe. Asthma cannot be cured, but medicines and lifestyle changes can help control it. Make sure you understand how to avoid triggers and how and when to use your medicines. This information is not intended to replace advice given to you by your health care provider. Make sure you discuss any questions you have with your health care provider. Document Revised: 06/15/2019 Document Reviewed: 06/15/2019 Elsevier Patient Education  2022 [employer].

## 2020-11-12 NOTE — Progress Notes (Signed)
[facility] [address],  [address]  Pulmonary Sleep Medicine   Office Visit Note  Patient Name: [name] DOB: 1970/07/31 MRN [id number]  Date of Service: 11/12/2020  Complaints/HPI: OSA Asthma Allergic rhinitis Insomnia.  She is actually been doing quite well with her treatments.  Continues to use the allergy shots and she has been well maintained as far as her breathing is concerned.  No admissions to the hospital.  Denies having any wheezing.  Occasional cough is noted.  She does have some rhinitis symptoms.  In addition she also complained about having some difficulty with her sleep.  She feels the difficulty is initiating sleep.  She was asking about may be some short-term use of Ambien to see if this makes any difference  ROS  General: (-) fever, (-) chills, (-) night sweats, (-) weakness Skin: (-) rashes, (-) itching,. Eyes: (-) visual changes, (-) redness, (-) itching. Nose and Sinuses: (-) nasal stuffiness or itchiness, (-) postnasal drip, (-) nosebleeds, (-) sinus trouble. Mouth and Throat: (-) sore throat, (-) hoarseness. Neck: (-) swollen glands, (-) enlarged thyroid, (-) neck pain. Respiratory: + cough, (-) bloody sputum, - shortness of breath, + wheezing. Cardiovascular: - ankle swelling, (-) chest pain. Lymphatic: (-) lymph node enlargement. Neurologic: (-) numbness, (-) tingling. Psychiatric: (-) anxiety, (-) depression   Current Medication: Outpatient Encounter Medications as of 11/12/2020  Medication Sig Note   albuterol (VENTOLIN HFA) 108 (90 Base) MCG/ACT inhaler Inhale 1-2 puffs into the lungs every 4 (four) hours as needed for wheezing or shortness of breath.    buPROPion (WELLBUTRIN XL) 300 MG 24 hr tablet TAKE 1 TABLET(300 MG) BY MOUTH DAILY    cetirizine (ZYRTEC) 10 MG tablet Take 10 mg by mouth daily.    cyclobenzaprine (FLEXERIL) 10 MG tablet Take 1 tablet (10 mg total) by mouth 3 (three) times daily as needed for muscle  spasms.    EPINEPHrine (EPIPEN 2-PAK) 0.3 mg/0.3 mL IJ SOAJ injection Inject 0.3 mLs (0.3 mg total) into the muscle as needed for anaphylaxis.    escitalopram (LEXAPRO) 20 MG tablet Take 1 tablet (20 mg total) by mouth daily.    Insulin Syringe-Needle U-100 (INSULIN SYRINGE 1CC/31GX5/16") 31G X 5/16" 1 ML MISC USE FOR ALLERGIES TWO TIMES A WEEK    LORazepam (ATIVAN) 0.5 MG tablet Take 1 tablet (0.5 mg total) by mouth daily as needed for anxiety.    montelukast (SINGULAIR) 10 MG tablet TAKE 1 TABLET(10 MG) BY MOUTH DAILY    naproxen (NAPROSYN) 500 MG tablet TAKE 1 TABLET(500 MG) BY MOUTH TWICE DAILY WITH A MEAL    omeprazole (PRILOSEC) 40 MG capsule Take 1 capsule (40 mg total) by mouth in the morning and at bedtime. For 30 days.    roflumilast (DALIRESP) 500 MCG TABS tablet Take 1 tablet (500 mcg total) by mouth daily.    triamcinolone ointment (KENALOG) 0.1 % APPLY TO THE AFFECTED AREA LEG, ARM, AND TRUNK TWICE DAILY. AVOID USE ON FACE    Vitamin D, Ergocalciferol, (DRISDOL) 50000 units CAPS capsule Take 50,000 Units by mouth once a week. 11/28/2015: Received from: External Pharmacy   zolpidem (AMBIEN) 10 MG tablet Take 1 tablet (10 mg total) by mouth at bedtime as needed. for sleep    [DISCONTINUED] Insulin Syringe-Needle U-100 (INSULIN SYRINGE 1CC/31GX5/16") 31G X 5/16" 1 ML MISC USE FOR ALLERGIES TWO TIMES A WEEK    [DISCONTINUED] roflumilast (DALIRESP) 500 MCG TABS tablet Take 1 tablet (500 mcg total) by mouth  daily.    No facility-administered encounter medications on file as of 11/12/2020.    Surgical History: Past Surgical History:  Procedure Laterality Date   ABDOMINAL HYSTERECTOMY     APPENDECTOMY     ESOPHAGOGASTRODUODENOSCOPY (EGD) WITH PROPOFOL N/A 06/14/2019   Procedure: ESOPHAGOGASTRODUODENOSCOPY (EGD) WITH PROPOFOL;  Surgeon: Lucilla Lame, MD;  Location: [facility] ENDOSCOPY;  Service: Endoscopy;  Laterality: N/A;   OVARIAN CYST DRAINAGE Right Nov. 2003   TONSILLECTOMY     TUBAL  LIGATION      Medical History: Past Medical History:  Diagnosis Date   Allergic rhinitis    Anxiety    Asthma    Constipation    COPD (chronic obstructive pulmonary disease) (HCC)    Depression    Hematuria    Insomnia    Reflux     Family History: Family History  Problem Relation Age of Onset   Cancer Mother        ovarian   Stroke Father    Cancer Maternal Uncle        lung   Diabetes Maternal Grandmother    Emphysema Maternal Grandfather    Diabetes Paternal Grandmother    Stroke Paternal Grandfather    Breast cancer Neg Hx     Social History: Social History   Socioeconomic History   Marital status: Married    Spouse name: Not on file   Number of children: Not on file   Years of education: Not on file   Highest education level: Not on file  Occupational History   Not on file  Tobacco Use   Smoking status: Never   Smokeless tobacco: Never  Vaping Use   Vaping Use: Never used  Substance and Sexual Activity   Alcohol use: Yes    Comment: occasional   Drug use: No   Sexual activity: Yes  Other Topics Concern   Not on file  Social History Narrative   Not on file   Social Determinants of Health   Financial Resource Strain: Not on file  Food Insecurity: Not on file  Transportation Needs: Not on file  Physical Activity: Not on file  Stress: Not on file  Social Connections: Not on file  Intimate Partner Violence: Not on file    Vital Signs: Blood pressure 128/82, pulse 92, temperature 98.3 F (36.8 C), resp. rate 16, height _0  (1.727 m), weight 191 lb 6.4 oz (86.8 kg), SpO2 97 %.  Examination: General Appearance: The patient is well-developed, well-nourished, and in no distress. Skin: Gross inspection of skin unremarkable. Head: normocephalic, no gross deformities. Eyes: no gross deformities noted. ENT: ears appear grossly normal no exudates. Neck: Supple. No thyromegaly. No LAD. Respiratory: No rhonchi no rales are noted at this  time. Cardiovascular: Normal S1 and S2 without murmur or rub. Extremities: No cyanosis. pulses are equal. Neurologic: Alert and oriented. No involuntary movements.  LABS: Recent Results (from the past 2160 hour(s))  Urinalysis, Routine w reflex microscopic     Status: Abnormal   Collection Time: 09/26/20 11:16 AM  Result Value Ref Range   Specific Gravity, UA 1.025 1.005 - 1.030   pH, UA 6.0 5.0 - 7.5   Color, UA Yellow Yellow   Appearance Ur Clear Clear   Leukocytes,UA Negative Negative   Protein,UA Trace (A) Negative/Trace   Glucose, UA Negative Negative   Ketones, UA Negative Negative   RBC, UA Trace (A) Negative   Bilirubin, UA Negative Negative   Urobilinogen, Ur 0.2 0.2 - 1.0 mg/dL  Nitrite, UA Negative Negative   Microscopic Examination See below:   Microscopic Examination     Status: Abnormal   Collection Time: 09/26/20 11:16 AM   Urine  Result Value Ref Range   WBC, UA None seen 0 - 5 /hpf   RBC 0-2 0 - 2 /hpf   Epithelial Cells (non renal) 0-10 0 - 10 /hpf   Mucus, UA Present (A) Not Estab.   Bacteria, UA Moderate (A) None seen/Few  CBC with Differential/Platelet     Status: None   Collection Time: 09/26/20 11:21 AM  Result Value Ref Range   WBC 5.6 3.4 - 10.8 x10E3/uL   RBC 4.01 3.77 - 5.28 x10E6/uL   Hemoglobin 12.7 11.1 - 15.9 g/dL   Hematocrit 37.4 34.0 - 46.6 %   MCV 93 79 - 97 fL   MCH 31.7 26.6 - 33.0 pg   MCHC 34.0 31.5 - 35.7 g/dL   RDW 12.1 11.7 - 15.4 %   Platelets 252 150 - 450 x10E3/uL   Neutrophils 52 Not Estab. %   Lymphs 38 Not Estab. %   Monocytes 6 Not Estab. %   Eos 3 Not Estab. %   Basos 1 Not Estab. %   Neutrophils Absolute 2.8 1.4 - 7.0 x10E3/uL   Lymphocytes Absolute 2.2 0.7 - 3.1 x10E3/uL   Monocytes Absolute 0.4 0.1 - 0.9 x10E3/uL   EOS (ABSOLUTE) 0.2 0.0 - 0.4 x10E3/uL   Basophils Absolute 0.1 0.0 - 0.2 x10E3/uL   Immature Granulocytes 0 Not Estab. %   Immature Grans (Abs) 0.0 0.0 - 0.1 x10E3/uL  Comprehensive metabolic  panel     Status: None   Collection Time: 09/26/20 11:21 AM  Result Value Ref Range   Glucose 99 65 - 99 mg/dL   BUN 11 6 - 24 mg/dL   Creatinine, Ser 0.58 0.57 - 1.00 mg/dL   eGFR 109 >59 mL/min/1.73   BUN/Creatinine Ratio 19 9 - 23   Sodium 141 134 - 144 mmol/L   Potassium 4.4 3.5 - 5.2 mmol/L   Chloride 101 96 - 106 mmol/L   CO2 24 20 - 29 mmol/L   Calcium 9.8 8.7 - 10.2 mg/dL   Total Protein 7.1 6.0 - 8.5 g/dL   Albumin 4.9 3.8 - 4.9 g/dL   Globulin, Total 2.2 1.5 - 4.5 g/dL   Albumin/Globulin Ratio 2.2 1.2 - 2.2   Bilirubin Total 0.3 0.0 - 1.2 mg/dL   Alkaline Phosphatase 71 44 - 121 IU/L   AST 13 0 - 40 IU/L   ALT 13 0 - 32 IU/L  Lipid Panel w/o Chol/HDL Ratio     Status: Abnormal   Collection Time: 09/26/20 11:21 AM  Result Value Ref Range   Cholesterol, Total 247 (H) 100 - 199 mg/dL   Triglycerides 336 (H) 0 - 149 mg/dL   HDL 60 >39 mg/dL   VLDL Cholesterol Cal 59 (H) 5 - 40 mg/dL   LDL Chol Calc (NIH) 128 (H) 0 - 99 mg/dL  TSH     Status: None   Collection Time: 09/26/20 11:21 AM  Result Value Ref Range   TSH 2.130 0.450 - 4.500 uIU/mL  B12     Status: None   Collection Time: 09/26/20 11:21 AM  Result Value Ref Range   Vitamin B-12 487 232 - 1,245 pg/mL  Hepatitis C Antibody     Status: None   Collection Time: 09/26/20 11:21 AM  Result Value Ref Range   Hep C Virus Ab <0.1  0.0 - 0.9 s/co ratio    Comment:                                   Negative:     < 0.8                              Indeterminate: 0.8 - 0.9                                   Positive:     > 0.9  HCV antibody alone does not differentiate between  previous resolved infection and active infection.  The CDC and current clinical guidelines recommend  that a positive HCV antibody result be followed up  with an HCV RNA test to support the diagnosis of  acute HCV infection. [facility] offers Hepatitis C  Virus (HCV) RNA, Diagnosis, NAA (944967) and  Hepatitis C Virus (HCV) Antibody with reflex to   Quantitative Real-time PCR (144050).   Cologuard     Status: None   Collection Time: 10/01/20 12:01 PM  Result Value Ref Range   Cologuard Negative Negative    Comment:  NEGATIVE TEST RESULT. A negative Cologuard result indicates a low likelihood that a colorectal cancer (CRC) or advanced adenoma (adenomatous polyps with more advanced pre-malignant features)  is present. The chance that a person with a negative Cologuard test has a colorectal cancer is less than 1 in 1500 (negative predictive value >99.9%) or has an  advanced adenoma is less than  5.3% (negative predictive value 94.7%). These data are based on a prospective cross-sectional study of 10,000 individuals at average risk for colorectal cancer who were screened with both Cologuard and colonoscopy. (Imperiale T. et al, N Engl J Med 2014;370(14):1286-1297) The normal value (reference range) for this assay is negative.  COLOGUARD RE-SCREENING RECOMMENDATION: Periodic colorectal cancer screening is an important part of preventive healthcare for asymptomatic individuals at average risk for colorectal cancer.  Following a negative Cologuard result, the [place] Task Force screening guidelines recommend a Cologuard re-screening interval of 3 years.  References: American Cancer Society Guideline for Colorectal Cancer Screening: https://www.cancer.org/cancer/colon-rectal-cancer/detection-diagnosis-staging/acs-recommendations.html.; Rex DK, Boland CR, Dominitz JK, Colorectal Cancer Screening: Recommendations for Physicians and Patients from the [place] Task Force on Colorectal Cancer Screening , Am J Gastroenterology 2017; 591:6384-6659.  TEST DESCRIPTION: Composite algorithmic analysis of stool DNA-biomarkers with hemoglobin immunoassay.   Quantitative values of individual biomarkers are not reportable and are not associated with individual biomarker result reference ranges. Cologuard is intended for  colorectal cancer screening of adults of either sex, 60 years or older, who are at average-risk for colorectal cancer (CRC). Cologuard has been approved for use by the U.S. FDA. The performance of Cologuard was  established in a cross sectional study of average-risk adults aged 53-84. Cologuard performance in patients ages 68 to 68 years was estimated by sub-group analysis of near-age groups. Colonoscopies performed for a positive result may find as the most clinically significant lesion: colorectal cancer [4.0%], advanced adenoma (including sessile serrated polyps greater than or equal to 1cm diameter) [20%] or non- advanced adenoma [31%]; or no colorectal neoplasia [45%]. These estimates are derived from a prospective cross-sectional screening study of 10,000 individuals at average risk for colorectal cancer who were screened with both  Cologuard and colonoscopy. (Imperiale T. et al, Alison Stalling J Med 2014;370(14):1286-1297.) Cologuard may produce a false negative or false positive result (no colorectal cancer or precancerous polyp present at colonoscopy follow up). A negative Cologuard test result does not guarantee the absence of CRC or advanced adenoma (pre-cancer). The current Cologuard  screening interval is every 3 years. 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 Cologuard performance data in a 10,000 patient pivotal study using colonoscopy as the reference method can be accessed at the following location: www.exactlabs.com/results. Additional description of the Cologuard test process, warnings and precautions can be found at www.cologuard.com.     Radiology: MM 3D SCREEN BREAST BILATERAL  Result Date: 05/03/2020 CLINICAL DATA:  Screening. EXAM: DIGITAL SCREENING BILATERAL MAMMOGRAM WITH TOMOSYNTHESIS AND CAD TECHNIQUE: Bilateral screening digital craniocaudal and mediolateral oblique mammograms were obtained. Bilateral screening digital breast tomosynthesis was performed. The images were  evaluated with computer-aided detection. COMPARISON:  Previous exam(s). ACR Breast Density Category b: There are scattered areas of fibroglandular density. FINDINGS: There are no findings suspicious for malignancy. The images were evaluated with computer-aided detection. IMPRESSION: No mammographic evidence of malignancy. A result letter of this screening mammogram will be mailed directly to the patient. RECOMMENDATION: Screening mammogram in one year. (Code:SM-B-01Y) BI-RADS CATEGORY  1: Negative. Electronically Signed   By: Lajean Manes M.D.   On: 05/03/2020 10:01    No results found.  No results found.    Assessment and Plan: Patient Active Problem List   Diagnosis Date Noted   Dysphagia    Stricture and stenosis of esophagus    Irritable bowel syndrome with diarrhea 09/04/2018   Palpitations 08/12/2018   Hyperlipidemia, mixed 08/02/2018   Precordial pain 08/02/2018   Family history of ovarian cancer 03/12/2016   Vitamin B12 deficiency (non anemic) 11/18/2014   Constipation    Asthma    Allergic rhinitis    Depression    Anxiety    Hematuria    Insomnia    GERD without esophagitis     1. OSA on CPAP She has been compliant with recommended therapy has had some short-term insomnia issues.  I think it would be safe to give her Ambien on an ongoing basis as she does actually seem to gain benefit from it - zolpidem (AMBIEN) 10 MG tablet; Take 1 tablet (10 mg total) by mouth at bedtime as needed. for sleep  Dispense: 90 tablet; Refill: 3  2. Mild persistent asthma without complication This is under good control she will continue with her allergy shots in addition gave her prescription for Daliresp. - roflumilast (DALIRESP) 500 MCG TABS tablet; Take 1 tablet (500 mcg total) by mouth daily.  Dispense: 90 tablet; Refill: 3  3. Seasonal allergic rhinitis due to pollen As above on allergy shots refills for syringes given - Insulin Syringe-Needle U-100 (INSULIN SYRINGE 1CC/31GX5/16")  31G X 5/16" 1 ML MISC; USE FOR ALLERGIES TWO TIMES A WEEK  Dispense: 100 each; Refill: 5  4. BMI 29.0-29.9,adult Obesity Counseling: Had a lengthy discussion regarding patients BMI and weight issues. Patient was instructed on portion control as well as increased activity. Also discussed caloric restrictions with trying to maintain intake less than 2000 Kcal. Discussions were made in accordance with the 5As of weight management. Simple actions such as not eating late and if able to, taking a walk is suggested.    General Counseling: I have discussed the findings of the evaluation and examination with [name].  I have also discussed any further diagnostic  evaluation thatmay be needed or ordered today. [name] verbalizes understanding of the findings of todays visit. We also reviewed her medications today and discussed drug interactions and side effects including but not limited excessive drowsiness and altered mental states. We also discussed that there is always a risk not just to her but also people around her. she has been encouraged to call the office with any questions or concerns that should arise related to todays visit.  No orders of the defined types were placed in this encounter.    Time spent: 66  I have personally obtained a history, examined the patient, evaluated laboratory and imaging results, formulated the assessment and plan and placed orders.    Allyne Gee, MD [facility] Pulmonary and Critical Care Sleep medicine

## 2020-11-13 ENCOUNTER — Other Ambulatory Visit: Payer: Self-pay

## 2020-11-13 MED ORDER — ZOLPIDEM TARTRATE 10 MG PO TABS: 10.0000 mg | ORAL_TABLET | Freq: Every evening | ORAL | 3 refills | Status: DC | PRN

## 2020-12-17 ENCOUNTER — Encounter: Payer: Self-pay | Admitting: Family Medicine

## 2020-12-17 ENCOUNTER — Other Ambulatory Visit: Payer: Self-pay

## 2020-12-17 ENCOUNTER — Ambulatory Visit: Payer: BC Managed Care – PPO | Admitting: Family Medicine

## 2020-12-17 VITALS — BP 134/87 | HR 80 | Temp 99.2°F | Ht 67.6 in | Wt 190.0 lb

## 2020-12-17 DIAGNOSIS — N951 Menopausal and female climacteric states: Secondary | ICD-10-CM | POA: Diagnosis not present

## 2020-12-17 DIAGNOSIS — Z23 Encounter for immunization: Secondary | ICD-10-CM

## 2020-12-17 DIAGNOSIS — R8290 Unspecified abnormal findings in urine: Secondary | ICD-10-CM

## 2020-12-17 LAB — URINALYSIS, ROUTINE W REFLEX MICROSCOPIC
Bilirubin, UA: NEGATIVE
Glucose, UA: NEGATIVE
Ketones, UA: NEGATIVE
Leukocytes,UA: NEGATIVE
Nitrite, UA: NEGATIVE
Protein,UA: NEGATIVE
Specific Gravity, UA: 1.03 (ref 1.005–1.030)
Urobilinogen, Ur: 0.2 mg/dL (ref 0.2–1.0)
pH, UA: 5.5 (ref 5.0–7.5)

## 2020-12-17 LAB — MICROSCOPIC EXAMINATION: WBC, UA: NONE SEEN /hpf (ref 0–5)

## 2020-12-17 MED ORDER — ESTRADIOL 0.025 MG/24HR TD PTWK: 0.0250 mg | MEDICATED_PATCH | TRANSDERMAL | 12 refills | Status: DC

## 2020-12-17 NOTE — Progress Notes (Signed)
BP 134/87   Pulse 80   Temp 99.2 F (37.3 C) (Oral)   Ht 5' 7.6" (1.717 m)   Wt 190 lb (86.2 kg)   SpO2 98%   BMI 29.23 kg/m    Subjective:    Patient ID: [name], female    DOB: 1970-04-22, 50 y.o.   MRN: [id number]  HPI: [name] is a 49 y.o. female  Chief Complaint  Patient presents with   Menopause   MENOPAUSAL SYMPTOMS- black cohash, estroven  Duration: couple of years, way worse in the past 3-4 months Symptom severity: severe Hot flashes: yes Night sweats: yes Sleep disturbances: yes Vaginal dryness: no Dyspareunia:no Decreased libido: no Emotional lability: no Stress incontinence: no Previous HRT/pharmacotherapy: no Hysterectomy: yes Absolute Contraindications to Hormonal Therapy:     Undiagnosed vaginal bleeding: no    Breast cancer: no    Endometrial cancer: no    Coronary disease: no    Cerebrovascular disease: no    Venous thromboembolic disease: no   Relevant past medical, surgical, family and social history reviewed and updated as indicated. Interim medical history since our last visit reviewed. Allergies and medications reviewed and updated.  Review of Systems  Constitutional:  Positive for chills, diaphoresis and fatigue. Negative for activity change, appetite change, fever and unexpected weight change.  HENT: Negative.    Respiratory: Negative.    Cardiovascular:  Positive for palpitations. Negative for chest pain and leg swelling.  Gastrointestinal: Negative.   Musculoskeletal: Negative.   Skin: Negative.   Psychiatric/Behavioral:  Positive for sleep disturbance. Negative for agitation, behavioral problems, confusion, decreased concentration, dysphoric mood, hallucinations, self-injury and suicidal ideas. The patient is not nervous/anxious and is not hyperactive.    Per HPI unless specifically indicated above     Objective:    BP 134/87   Pulse 80   Temp 99.2 F (37.3 C) (Oral)   Ht 5' 7.6" (1.717 m)   Wt 190 lb (86.2 kg)    SpO2 98%   BMI 29.23 kg/m   Wt Readings from Last 3 Encounters:  12/17/20 190 lb (86.2 kg)  11/12/20 191 lb 6.4 oz (86.8 kg)  09/26/20 190 lb (86.2 kg)    Physical Exam Vitals and nursing note reviewed.  Constitutional:      General: She is not in acute distress.    Appearance: Normal appearance. She is not ill-appearing, toxic-appearing or diaphoretic.  HENT:     Head: Normocephalic and atraumatic.     Right Ear: External ear normal.     Left Ear: External ear normal.     Nose: Nose normal.     Mouth/Throat:     Mouth: Mucous membranes are moist.     Pharynx: Oropharynx is clear.  Eyes:     General: No scleral icterus.       Right eye: No discharge.        Left eye: No discharge.     Extraocular Movements: Extraocular movements intact.     Conjunctiva/sclera: Conjunctivae normal.     Pupils: Pupils are equal, round, and reactive to light.  Cardiovascular:     Rate and Rhythm: Normal rate and regular rhythm.     Pulses: Normal pulses.     Heart sounds: Normal heart sounds. No murmur heard.   No friction rub. No gallop.  Pulmonary:     Effort: Pulmonary effort is normal. No respiratory distress.     Breath sounds: Normal breath sounds. No stridor. No wheezing, rhonchi or  rales.  Chest:     Chest wall: No tenderness.  Musculoskeletal:        General: Normal range of motion.     Cervical back: Normal range of motion and neck supple.  Skin:    General: Skin is warm and dry.     Capillary Refill: Capillary refill takes less than 2 seconds.     Coloration: Skin is not jaundiced or pale.     Findings: No bruising, erythema, lesion or rash.  Neurological:     General: No focal deficit present.     Mental Status: She is alert and oriented to person, place, and time. Mental status is at baseline.  Psychiatric:        Mood and Affect: Mood normal.        Behavior: Behavior normal.        Thought Content: Thought content normal.        Judgment: Judgment normal.     Results for orders placed or performed in visit on 09/26/20  Microscopic Examination   Urine  Result Value Ref Range   WBC, UA None seen 0 - 5 /hpf   RBC 0-2 0 - 2 /hpf   Epithelial Cells (non renal) 0-10 0 - 10 /hpf   Mucus, UA Present (A) Not Estab.   Bacteria, UA Moderate (A) None seen/Few  CBC with Differential/Platelet  Result Value Ref Range   WBC 5.6 3.4 - 10.8 x10E3/uL   RBC 4.01 3.77 - 5.28 x10E6/uL   Hemoglobin 12.7 11.1 - 15.9 g/dL   Hematocrit 37.4 34.0 - 46.6 %   MCV 93 79 - 97 fL   MCH 31.7 26.6 - 33.0 pg   MCHC 34.0 31.5 - 35.7 g/dL   RDW 12.1 11.7 - 15.4 %   Platelets 252 150 - 450 x10E3/uL   Neutrophils 52 Not Estab. %   Lymphs 38 Not Estab. %   Monocytes 6 Not Estab. %   Eos 3 Not Estab. %   Basos 1 Not Estab. %   Neutrophils Absolute 2.8 1.4 - 7.0 x10E3/uL   Lymphocytes Absolute 2.2 0.7 - 3.1 x10E3/uL   Monocytes Absolute 0.4 0.1 - 0.9 x10E3/uL   EOS (ABSOLUTE) 0.2 0.0 - 0.4 x10E3/uL   Basophils Absolute 0.1 0.0 - 0.2 x10E3/uL   Immature Granulocytes 0 Not Estab. %   Immature Grans (Abs) 0.0 0.0 - 0.1 x10E3/uL  Comprehensive metabolic panel  Result Value Ref Range   Glucose 99 65 - 99 mg/dL   BUN 11 6 - 24 mg/dL   Creatinine, Ser 0.58 0.57 - 1.00 mg/dL   eGFR 109 >59 mL/min/1.73   BUN/Creatinine Ratio 19 9 - 23   Sodium 141 134 - 144 mmol/L   Potassium 4.4 3.5 - 5.2 mmol/L   Chloride 101 96 - 106 mmol/L   CO2 24 20 - 29 mmol/L   Calcium 9.8 8.7 - 10.2 mg/dL   Total Protein 7.1 6.0 - 8.5 g/dL   Albumin 4.9 3.8 - 4.9 g/dL   Globulin, Total 2.2 1.5 - 4.5 g/dL   Albumin/Globulin Ratio 2.2 1.2 - 2.2   Bilirubin Total 0.3 0.0 - 1.2 mg/dL   Alkaline Phosphatase 71 44 - 121 IU/L   AST 13 0 - 40 IU/L   ALT 13 0 - 32 IU/L  Lipid Panel w/o Chol/HDL Ratio  Result Value Ref Range   Cholesterol, Total 247 (H) 100 - 199 mg/dL   Triglycerides 336 (H) 0 - 149 mg/dL   HDL  60 >39 mg/dL   VLDL Cholesterol Cal 59 (H) 5 - 40 mg/dL   LDL Chol Calc (NIH) 128  (H) 0 - 99 mg/dL  Urinalysis, Routine w reflex microscopic  Result Value Ref Range   Specific Gravity, UA 1.025 1.005 - 1.030   pH, UA 6.0 5.0 - 7.5   Color, UA Yellow Yellow   Appearance Ur Clear Clear   Leukocytes,UA Negative Negative   Protein,UA Trace (A) Negative/Trace   Glucose, UA Negative Negative   Ketones, UA Negative Negative   RBC, UA Trace (A) Negative   Bilirubin, UA Negative Negative   Urobilinogen, Ur 0.2 0.2 - 1.0 mg/dL   Nitrite, UA Negative Negative   Microscopic Examination See below:   TSH  Result Value Ref Range   TSH 2.130 0.450 - 4.500 uIU/mL  B12  Result Value Ref Range   Vitamin B-12 487 232 - 1,245 pg/mL  Hepatitis C Antibody  Result Value Ref Range   Hep C Virus Ab <0.1 0.0 - 0.9 s/co ratio  Cologuard  Result Value Ref Range   Cologuard Negative Negative      Assessment & Plan:   Problem List Items Addressed This Visit   None Visit Diagnoses     Perimenopausal    -  Primary   Will treat with climera patch and recheck on symptoms in 1 month. Call with any concerns. Continue to monitor.    Relevant Orders   Estradiol   LH   FSH   Testosterone, free, total([facility])   TSH   CBC with Differential/Platelet   Foul smelling urine       Will check urine today. Await results. Treat as needed.    Relevant Orders   Urinalysis, Routine w reflex microscopic        Follow up plan: Return in about 4 weeks (around 01/14/2021), or follow up menopause (virtual OK).

## 2020-12-19 LAB — CBC WITH DIFFERENTIAL/PLATELET
Basophils Absolute: 0.1 10*3/uL (ref 0.0–0.2)
Basos: 2 %
EOS (ABSOLUTE): 0.4 10*3/uL (ref 0.0–0.4)
Eos: 7 %
Hematocrit: 38.5 % (ref 34.0–46.6)
Hemoglobin: 12.8 g/dL (ref 11.1–15.9)
Immature Grans (Abs): 0 10*3/uL (ref 0.0–0.1)
Immature Granulocytes: 1 %
Lymphocytes Absolute: 2.6 10*3/uL (ref 0.7–3.1)
Lymphs: 41 %
MCH: 31.4 pg (ref 26.6–33.0)
MCHC: 33.2 g/dL (ref 31.5–35.7)
MCV: 95 fL (ref 79–97)
Monocytes Absolute: 0.4 10*3/uL (ref 0.1–0.9)
Monocytes: 6 %
Neutrophils Absolute: 2.8 10*3/uL (ref 1.4–7.0)
Neutrophils: 43 %
Platelets: 264 10*3/uL (ref 150–450)
RBC: 4.07 x10E6/uL (ref 3.77–5.28)
RDW: 12.5 % (ref 11.7–15.4)
WBC: 6.3 10*3/uL (ref 3.4–10.8)

## 2020-12-19 LAB — TESTOSTERONE, FREE, TOTAL, SHBG
Sex Hormone Binding: 27.6 nmol/L (ref 17.3–125.0)
Testosterone, Free: 0.8 pg/mL (ref 0.0–4.2)
Testosterone: <3 ng/dL — ABNORMAL LOW (ref 4–50)

## 2020-12-19 LAB — FOLLICLE STIMULATING HORMONE: FSH: 60.4 m[IU]/mL

## 2020-12-19 LAB — TSH: TSH: 2.31 u[IU]/mL (ref 0.450–4.500)

## 2020-12-19 LAB — LUTEINIZING HORMONE: LH: 42.7 m[IU]/mL

## 2020-12-19 LAB — ESTRADIOL: Estradiol: <5 pg/mL

## 2020-12-27 ENCOUNTER — Ambulatory Visit: Payer: BC Managed Care – PPO | Admitting: Physician Assistant

## 2021-01-02 ENCOUNTER — Encounter: Payer: Self-pay | Admitting: Family Medicine

## 2021-01-11 MED ORDER — ESTRADIOL 0.5 MG PO TABS: 0.5000 mg | ORAL_TABLET | Freq: Every day | ORAL | 2 refills | Status: DC

## 2021-01-14 ENCOUNTER — Telehealth (INDEPENDENT_AMBULATORY_CARE_PROVIDER_SITE_OTHER): Payer: BC Managed Care – PPO | Admitting: Family Medicine

## 2021-01-14 ENCOUNTER — Encounter: Payer: Self-pay | Admitting: Family Medicine

## 2021-01-14 DIAGNOSIS — R232 Flushing: Secondary | ICD-10-CM | POA: Diagnosis not present

## 2021-01-14 MED ORDER — ESTRADIOL 0.5 MG PO TABS: 0.5000 mg | ORAL_TABLET | Freq: Every day | ORAL | 1 refills | Status: DC

## 2021-01-14 NOTE — Progress Notes (Signed)
There were no vitals taken for this visit.   Subjective:    Patient ID: [name], female    DOB: 1971/02/04, 50 y.o.   MRN: [id number]  HPI: [name] is a 50 y.o. female  Chief Complaint  Patient presents with   Menopause    Medication is doing well   MENOPAUSAL SYMPTOMS Duration: controlled Symptom severity: mild Hot flashes: no Night sweats: no Sleep disturbances: no Vaginal dryness: no Dyspareunia:no Decreased libido: no Emotional lability: no Stress incontinence: no Previous HRT/pharmacotherapy: yes Hysterectomy: no Absolute Contraindications to Hormonal Therapy:     Undiagnosed vaginal bleeding: no    Breast cancer: no    Endometrial cancer: no    Coronary disease: no    Cerebrovascular disease: no    Venous thromboembolic disease: no  Relevant past medical, surgical, family and social history reviewed and updated as indicated. Interim medical history since our last visit reviewed. Allergies and medications reviewed and updated.  Review of Systems  Constitutional: Negative.   Respiratory: Negative.    Cardiovascular: Negative.   Gastrointestinal: Negative.   Neurological: Negative.   Psychiatric/Behavioral: Negative.     Per HPI unless specifically indicated above     Objective:    There were no vitals taken for this visit.  Wt Readings from Last 3 Encounters:  12/17/20 190 lb (86.2 kg)  11/12/20 191 lb 6.4 oz (86.8 kg)  09/26/20 190 lb (86.2 kg)    Physical Exam Vitals and nursing note reviewed.  Constitutional:      General: She is not in acute distress.    Appearance: Normal appearance. She is not ill-appearing, toxic-appearing or diaphoretic.  HENT:     Head: Normocephalic and atraumatic.     Right Ear: External ear normal.     Left Ear: External ear normal.     Nose: Nose normal.     Mouth/Throat:     Mouth: Mucous membranes are moist.     Pharynx: Oropharynx is clear.  Eyes:     General: No scleral icterus.       Right eye:  No discharge.        Left eye: No discharge.     Conjunctiva/sclera: Conjunctivae normal.     Pupils: Pupils are equal, round, and reactive to light.  Pulmonary:     Effort: Pulmonary effort is normal. No respiratory distress.     Comments: Speaking in full sentences Musculoskeletal:        General: Normal range of motion.     Cervical back: Normal range of motion.  Skin:    Coloration: Skin is not jaundiced or pale.     Findings: No bruising, erythema, lesion or rash.  Neurological:     Mental Status: She is alert and oriented to person, place, and time. Mental status is at baseline.  Psychiatric:        Mood and Affect: Mood normal.        Behavior: Behavior normal.        Thought Content: Thought content normal.        Judgment: Judgment normal.    Results for orders placed or performed in visit on 12/17/20  Microscopic Examination   Urine  Result Value Ref Range   WBC, UA None seen 0 - 5 /hpf   RBC 0-2 0 - 2 /hpf   Epithelial Cells (non renal) 0-10 0 - 10 /hpf   Mucus, UA Present (A) Not Estab.   Bacteria, UA Few (A) None  seen/Few  Estradiol  Result Value Ref Range   Estradiol <5.0 pg/mL  LH  Result Value Ref Range   LH 42.7 mIU/mL  FSH  Result Value Ref Range   FSH 60.4 mIU/mL  Testosterone, free, total([facility])  Result Value Ref Range   Testosterone <3 (L) 4 - 50 ng/dL   Testosterone, Free 0.8 0.0 - 4.2 pg/mL   Sex Hormone Binding 27.6 17.3 - 125.0 nmol/L  TSH  Result Value Ref Range   TSH 2.310 0.450 - 4.500 uIU/mL  CBC with Differential/Platelet  Result Value Ref Range   WBC 6.3 3.4 - 10.8 x10E3/uL   RBC 4.07 3.77 - 5.28 x10E6/uL   Hemoglobin 12.8 11.1 - 15.9 g/dL   Hematocrit 38.5 34.0 - 46.6 %   MCV 95 79 - 97 fL   MCH 31.4 26.6 - 33.0 pg   MCHC 33.2 31.5 - 35.7 g/dL   RDW 12.5 11.7 - 15.4 %   Platelets 264 150 - 450 x10E3/uL   Neutrophils 43 Not Estab. %   Lymphs 41 Not Estab. %   Monocytes 6 Not Estab. %   Eos 7 Not Estab. %   Basos  2 Not Estab. %   Neutrophils Absolute 2.8 1.4 - 7.0 x10E3/uL   Lymphocytes Absolute 2.6 0.7 - 3.1 x10E3/uL   Monocytes Absolute 0.4 0.1 - 0.9 x10E3/uL   EOS (ABSOLUTE) 0.4 0.0 - 0.4 x10E3/uL   Basophils Absolute 0.1 0.0 - 0.2 x10E3/uL   Immature Granulocytes 1 Not Estab. %   Immature Grans (Abs) 0.0 0.0 - 0.1 x10E3/uL  Urinalysis, Routine w reflex microscopic  Result Value Ref Range   Specific Gravity, UA 1.030 1.005 - 1.030   pH, UA 5.5 5.0 - 7.5   Color, UA Yellow Yellow   Appearance Ur Clear Clear   Leukocytes,UA Negative Negative   Protein,UA Negative Negative/Trace   Glucose, UA Negative Negative   Ketones, UA Negative Negative   RBC, UA Trace (A) Negative   Bilirubin, UA Negative Negative   Urobilinogen, Ur 0.2 0.2 - 1.0 mg/dL   Nitrite, UA Negative Negative   Microscopic Examination See below:       Assessment & Plan:   Problem List Items Addressed This Visit       Cardiovascular and Mediastinum   Hot flashes - Primary    Tolerating her medicine well. Continue to monitor. Refills given.         Follow up plan: Return in about 3 months (around 04/16/2021).   This visit was completed via video visit through MyChart due to the restrictions of the COVID-19 pandemic. All issues as above were discussed and addressed. Physical exam was done as above through visual confirmation on video through MyChart. If it was felt that the patient should be evaluated in the office, they were directed there. The patient verbally consented to this visit. Location of the patient: home Location of the provider: work Those involved with this call:  Provider: Park Liter, DO CMA: Frazier Butt, CMA Front Desk/Registration: [employer]  Time spent on call:  15 minutes with patient face to face via video conference. More than 50% of this time was spent in counseling and coordination of care. 23 minutes total spent in review of patient's record and preparation of their chart.

## 2021-01-14 NOTE — Assessment & Plan Note (Signed)
Tolerating her medicine well. Continue to monitor. Refills given.

## 2021-01-15 NOTE — Progress Notes (Signed)
Appointment scheduled.

## 2021-01-16 ENCOUNTER — Other Ambulatory Visit: Payer: Self-pay | Admitting: Family Medicine

## 2021-01-16 ENCOUNTER — Encounter: Payer: Self-pay | Admitting: Family Medicine

## 2021-01-16 MED ORDER — VITAMIN D (ERGOCALCIFEROL) 1.25 MG (50000 UNIT) PO CAPS: 50000.0000 [IU] | ORAL_CAPSULE | ORAL | 1 refills | Status: DC

## 2021-01-22 ENCOUNTER — Telehealth: Payer: Self-pay

## 2021-01-22 NOTE — Telephone Encounter (Signed)
Pt called and advised she needs a refill on allergy serum.  Gave chart to Tat to check benefits.  [place] staff message

## 2021-01-29 DIAGNOSIS — Z85828 Personal history of other malignant neoplasm of skin: Secondary | ICD-10-CM | POA: Insufficient documentation

## 2021-01-30 DIAGNOSIS — D229 Melanocytic nevi, unspecified: Secondary | ICD-10-CM | POA: Diagnosis not present

## 2021-01-30 DIAGNOSIS — D492 Neoplasm of unspecified behavior of bone, soft tissue, and skin: Secondary | ICD-10-CM | POA: Diagnosis not present

## 2021-01-30 DIAGNOSIS — L438 Other lichen planus: Secondary | ICD-10-CM | POA: Diagnosis not present

## 2021-01-30 DIAGNOSIS — L814 Other melanin hyperpigmentation: Secondary | ICD-10-CM | POA: Diagnosis not present

## 2021-01-30 DIAGNOSIS — L578 Other skin changes due to chronic exposure to nonionizing radiation: Secondary | ICD-10-CM | POA: Diagnosis not present

## 2021-02-07 ENCOUNTER — Telehealth: Payer: Self-pay

## 2021-02-07 NOTE — Telephone Encounter (Signed)
Patient's benefits checked for allergy injection/serum.  Patient will be responsible for $30.00 copay per injection.   No ded 100% allowance No auth 3000 oop max 374.59 met  [phone]

## 2021-02-14 DIAGNOSIS — J301 Allergic rhinitis due to pollen: Secondary | ICD-10-CM | POA: Diagnosis not present

## 2021-02-15 ENCOUNTER — Other Ambulatory Visit: Payer: Self-pay

## 2021-02-15 ENCOUNTER — Ambulatory Visit: Payer: BC Managed Care – PPO

## 2021-02-15 DIAGNOSIS — J301 Allergic rhinitis due to pollen: Secondary | ICD-10-CM

## 2021-02-15 NOTE — Progress Notes (Signed)
Aeroallergen Immunotherapy    Patient Details  Name: [name] MRN: [id number] Date of Birth: May 03, 1970  Order 1 of 2  Vial Label: Trees, [place], Weeds   0.20 mL of each antigen: [place], [employer], American [place], [place], Dock Sorrel Mix, English [place], Lambs [place], [place] Box Elder, Starwood Hotels, Pigweed Mix, M[employer] Holdings, Privet, Ragweed Mix, B[employer]on[employer] and Company, [place] Thistle, Western Water Hemp, and White/Red Mulberry   3.4   mL Extract Subtotal 2.6   mL Normal Saline Diluent 5.0  mL Maintenance Total   Final Concentration above is stated in weight/volume (wt/vol).  Allergen units (AU/ml) biological units (BAU/ml).  The total volume is 5 ml.      Special Instructions: none    Order 2 of 2  Vial Label: Dust mite, Cockroach, Mold  0.20 mL of each antigen: Aureobasidium, Candida, Cat Hair, and Dust Mite Mix   0.8   mL Extract Subtotal 4.2   mL Normal Saline Diluent 5.0  mL Maintenance Total   Final Concentration above is stated in weight/volume (wt/vol).  Allergen units (AU/ml) biological units (BAU/ml).  The total volume is 5 ml.      Special Instructions: none

## 2021-02-20 ENCOUNTER — Ambulatory Visit (INDEPENDENT_AMBULATORY_CARE_PROVIDER_SITE_OTHER): Payer: BC Managed Care – PPO

## 2021-02-20 ENCOUNTER — Other Ambulatory Visit: Payer: Self-pay

## 2021-02-20 DIAGNOSIS — J301 Allergic rhinitis due to pollen: Secondary | ICD-10-CM

## 2021-03-26 ENCOUNTER — Other Ambulatory Visit: Payer: Self-pay | Admitting: Family Medicine

## 2021-03-26 NOTE — Telephone Encounter (Signed)
Requested medication (s) are due for refill today: Yes  Requested medication (s) are on the active medication list: Yes  Last refill:  01/16/21  Future visit scheduled: Yes  Notes to clinic:  See request.    Requested Prescriptions  Pending Prescriptions Disp Refills   Vitamin D, Ergocalciferol, (DRISDOL) 1.25 MG (50000 UNIT) CAPS capsule [Pharmacy Med Name: VITAMIN D2 50,000IU (ERGO) CAP RX] 5 capsule 1    Sig: TAKE 1 CAPSULE BY MOUTH 1 TIME A WEEK     Endocrinology:  Vitamins - Vitamin D Supplementation Failed - 03/26/2021 10:51 AM      Failed - Manual Review: Route requests for 50,000 IU strength to the provider      Failed - Phosphate in normal range and within 360 days    No results found for: PHOS        Failed - Vitamin D in normal range and within 360 days    Vit D, 25-Hydroxy  Date Value Ref Range Status  07/27/2019 43.6 30.0 - 100.0 ng/mL Final    Comment:    Vitamin D deficiency has been defined by the Institute of Medicine and an Endocrine Society practice guideline as a level of serum 25-OH vitamin D less than 20 ng/mL (1,2). The Endocrine Society went on to further define vitamin D insufficiency as a level between 21 and 29 ng/mL (2). 1. IOM (Institute of Medicine). 2010. Dietary reference    intakes for calcium and D. [place]: The    [employer]. 2. Holick MF, Binkley [place], Bischoff-Ferrari HA, et al.    Evaluation, treatment, and prevention of vitamin D    deficiency: an Endocrine Society clinical practice    guideline. JCEM. 2011 Jul; 96(7):1911-30.           Passed - Ca in normal range and within 360 days    Calcium  Date Value Ref Range Status  09/26/2020 9.8 8.7 - 10.2 mg/dL Final   Calcium, Total  Date Value Ref Range Status  03/04/2012 9.5 8.5 - 10.1 mg/dL Final          Passed - Valid encounter within last 12 months    Recent Outpatient Visits           2 months ago Hot flashes   [place],  [place], DO   3 months ago [place], [name], DO   6 months ago Routine general medical examination at a health care facility   [facility], [place], DO   9 months ago Seasonal allergic rhinitis due to pollen   [facility], Scheryl Darter, NP   10 months ago Mid back pain   [place], Barb Merino, DO       Future Appointments             In 3 days Wynetta Emery, Barb Merino, DO [employer], [place]   In 3 weeks Wynetta Emery, Barb Merino, DO [employer], [facility]

## 2021-03-29 ENCOUNTER — Ambulatory Visit: Payer: BC Managed Care – PPO | Admitting: Family Medicine

## 2021-03-29 ENCOUNTER — Other Ambulatory Visit: Payer: Self-pay

## 2021-03-29 ENCOUNTER — Encounter: Payer: Self-pay | Admitting: Family Medicine

## 2021-03-29 VITALS — BP 125/78 | HR 85 | Temp 98.3°F | Ht 67.5 in | Wt 188.0 lb

## 2021-03-29 DIAGNOSIS — H9313 Tinnitus, bilateral: Secondary | ICD-10-CM

## 2021-03-29 DIAGNOSIS — F330 Major depressive disorder, recurrent, mild: Secondary | ICD-10-CM

## 2021-03-29 DIAGNOSIS — K219 Gastro-esophageal reflux disease without esophagitis: Secondary | ICD-10-CM | POA: Diagnosis not present

## 2021-03-29 DIAGNOSIS — E782 Mixed hyperlipidemia: Secondary | ICD-10-CM

## 2021-03-29 DIAGNOSIS — E559 Vitamin D deficiency, unspecified: Secondary | ICD-10-CM | POA: Diagnosis not present

## 2021-03-29 DIAGNOSIS — F419 Anxiety disorder, unspecified: Secondary | ICD-10-CM

## 2021-03-29 MED ORDER — ESTRADIOL 0.5 MG PO TABS: 0.5000 mg | ORAL_TABLET | Freq: Every day | ORAL | 1 refills | Status: DC

## 2021-03-29 MED ORDER — LORAZEPAM 0.5 MG PO TABS: 0.5000 mg | ORAL_TABLET | Freq: Every day | ORAL | 0 refills | Status: AC | PRN

## 2021-03-29 MED ORDER — VITAMIN D (ERGOCALCIFEROL) 1.25 MG (50000 UNIT) PO CAPS: 50000.0000 [IU] | ORAL_CAPSULE | ORAL | 4 refills | Status: DC

## 2021-03-29 MED ORDER — OMEPRAZOLE 40 MG PO CPDR: 40.0000 mg | DELAYED_RELEASE_CAPSULE | Freq: Two times a day (BID) | ORAL | 1 refills | Status: DC

## 2021-03-29 MED ORDER — CYCLOBENZAPRINE HCL 10 MG PO TABS: 10.0000 mg | ORAL_TABLET | Freq: Three times a day (TID) | ORAL | 1 refills | Status: DC | PRN

## 2021-03-29 MED ORDER — BUPROPION HCL ER (XL) 300 MG PO TB24: ORAL_TABLET | ORAL | 1 refills | Status: DC

## 2021-03-29 MED ORDER — ESCITALOPRAM OXALATE 20 MG PO TABS
20.0000 mg | ORAL_TABLET | Freq: Every day | ORAL | 1 refills | Status: DC
Start: 2021-03-29 — End: 2021-09-24

## 2021-03-29 MED ORDER — NAPROXEN 500 MG PO TABS: ORAL_TABLET | ORAL | 1 refills | Status: DC

## 2021-03-29 MED ORDER — ALBUTEROL SULFATE HFA 108 (90 BASE) MCG/ACT IN AERS: 1.0000 | INHALATION_SPRAY | RESPIRATORY_TRACT | 6 refills | Status: DC | PRN

## 2021-03-29 NOTE — Assessment & Plan Note (Signed)
Under good control on current regimen. Continue current regimen. Continue to monitor. Call with any concerns. Refills given. Labs drawn today.   

## 2021-03-29 NOTE — Progress Notes (Signed)
BP 125/78    Pulse 85    Temp 98.3 F (36.8 C) (Oral)    Ht 5' 7.5" (1.715 m)    Wt 188 lb (85.3 kg)    SpO2 97%    BMI 29.01 kg/m    Subjective:    Patient ID: [name], female    DOB: October 14, 1970, 51 y.o.   MRN: [id number]  HPI: [name] is a 52 y.o. female  Chief Complaint  Patient presents with   Anxiety   Depression   ANXIETY/DEPRESSION Duration: chronic Status:stable Anxious mood: yes  Excessive worrying: no Irritability: no  Sweating: no Nausea: no Palpitations:no Hyperventilation: no Panic attacks: no Agoraphobia: no  Obscessions/compulsions: no Depressed mood: yes Depression screen [facility] 2/9 03/29/2021 01/14/2021 12/17/2020 09/26/2020 07/14/2019  Decreased Interest 0 0 0 0 0  Down, Depressed, Hopeless 0 0 0 0 0  PHQ - 2 Score 0 0 0 0 0  Altered sleeping 1 0 1 1 0  Tired, decreased energy 1 0 1 1 0  Change in appetite 0 0 0 0 0  Feeling bad or failure about yourself  0 0 0 0 0  Trouble concentrating 1 0 0 0 0  Moving slowly or fidgety/restless 0 0 0 0 0  Suicidal thoughts 0 0 0 0 0  PHQ-9 Score 3 0 2 2 0  Difficult doing work/chores Somewhat difficult Not difficult at all Not difficult at all Not difficult at all Not difficult at all  Some recent data might be hidden   Anhedonia: no Weight changes: no Insomnia: yes   Hypersomnia: no Fatigue/loss of energy: no Feelings of worthlessness: no Feelings of guilt: no Impaired concentration/indecisiveness: no Suicidal ideations: no  Crying spells: no Recent Stressors/Life Changes: no   Relationship problems: no   Family stress: no     Financial stress: no    Job stress: no    Recent death/loss: no  HYPERLIPIDEMIA Hyperlipidemia status: stable Satisfied with current treatment?  no Side effects:  no Medication compliance: not on anything Past cholesterol meds: none Supplements: none Aspirin:  no The 10-year ASCVD risk score (Arnett DK, et al., 2019) is: 1.7%   Values used to calculate the  score:     Age: 68 years     Sex: Female     Is Non-Hispanic African American: No     Diabetic: No     Tobacco smoker: No     Systolic Blood Pressure: 621 mmHg     Is BP treated: No     HDL Cholesterol: 60 mg/dL     Total Cholesterol: 247 mg/dL Chest pain:  no Coronary artery disease:  no  Relevant past medical, surgical, family and social history reviewed and updated as indicated. Interim medical history since our last visit reviewed. Allergies and medications reviewed and updated.  Review of Systems  Constitutional: Negative.   Respiratory: Negative.    Cardiovascular: Negative.   Gastrointestinal: Negative.   Musculoskeletal: Negative.   Skin: Negative.   Neurological: Negative.   Psychiatric/Behavioral: Negative.     Per HPI unless specifically indicated above     Objective:    BP 125/78    Pulse 85    Temp 98.3 F (36.8 C) (Oral)    Ht 5' 7.5" (1.715 m)    Wt 188 lb (85.3 kg)    SpO2 97%    BMI 29.01 kg/m   Wt Readings from Last 3 Encounters:  03/29/21 188 lb (85.3 kg)  12/17/20 190  lb (86.2 kg)  11/12/20 191 lb 6.4 oz (86.8 kg)    Physical Exam Vitals and nursing note reviewed.  Constitutional:      General: She is not in acute distress.    Appearance: Normal appearance. She is not ill-appearing, toxic-appearing or diaphoretic.  HENT:     Head: Normocephalic and atraumatic.     Right Ear: External ear normal.     Left Ear: External ear normal.     Nose: Nose normal.     Mouth/Throat:     Mouth: Mucous membranes are moist.     Pharynx: Oropharynx is clear.  Eyes:     General: No scleral icterus.       Right eye: No discharge.        Left eye: No discharge.     Extraocular Movements: Extraocular movements intact.     Conjunctiva/sclera: Conjunctivae normal.     Pupils: Pupils are equal, round, and reactive to light.  Cardiovascular:     Rate and Rhythm: Normal rate and regular rhythm.     Pulses: Normal pulses.     Heart sounds: Normal heart sounds.  No murmur heard.   No friction rub. No gallop.  Pulmonary:     Effort: Pulmonary effort is normal. No respiratory distress.     Breath sounds: Normal breath sounds. No stridor. No wheezing, rhonchi or rales.  Chest:     Chest wall: No tenderness.  Musculoskeletal:        General: Normal range of motion.     Cervical back: Normal range of motion and neck supple.  Skin:    General: Skin is warm and dry.     Capillary Refill: Capillary refill takes less than 2 seconds.     Coloration: Skin is not jaundiced or pale.     Findings: No bruising, erythema, lesion or rash.  Neurological:     General: No focal deficit present.     Mental Status: She is alert and oriented to person, place, and time. Mental status is at baseline.  Psychiatric:        Mood and Affect: Mood normal.        Behavior: Behavior normal.        Thought Content: Thought content normal.        Judgment: Judgment normal.    Results for orders placed or performed in visit on 12/17/20  Microscopic Examination   Urine  Result Value Ref Range   WBC, UA None seen 0 - 5 /hpf   RBC 0-2 0 - 2 /hpf   Epithelial Cells (non renal) 0-10 0 - 10 /hpf   Mucus, UA Present (A) Not Estab.   Bacteria, UA Few (A) None seen/Few  Estradiol  Result Value Ref Range   Estradiol <5.0 pg/mL  LH  Result Value Ref Range   LH 42.7 mIU/mL  [facility]  Result Value Ref Range   FSH 60.4 mIU/mL  Testosterone, free, total([facility])  Result Value Ref Range   Testosterone <3 (L) 4 - 50 ng/dL   Testosterone, Free 0.8 0.0 - 4.2 pg/mL   Sex Hormone Binding 27.6 17.3 - 125.0 nmol/L  TSH  Result Value Ref Range   TSH 2.310 0.450 - 4.500 uIU/mL  CBC with Differential/Platelet  Result Value Ref Range   WBC 6.3 3.4 - 10.8 x10E3/uL   RBC 4.07 3.77 - 5.28 x10E6/uL   Hemoglobin 12.8 11.1 - 15.9 g/dL   Hematocrit 38.5 34.0 - 46.6 %   MCV 95 79 -  97 fL   MCH 31.4 26.6 - 33.0 pg   MCHC 33.2 31.5 - 35.7 g/dL   RDW 12.5 11.7 - 15.4 %   Platelets  264 150 - 450 x10E3/uL   Neutrophils 43 Not Estab. %   Lymphs 41 Not Estab. %   Monocytes 6 Not Estab. %   Eos 7 Not Estab. %   Basos 2 Not Estab. %   Neutrophils Absolute 2.8 1.4 - 7.0 x10E3/uL   Lymphocytes Absolute 2.6 0.7 - 3.1 x10E3/uL   Monocytes Absolute 0.4 0.1 - 0.9 x10E3/uL   EOS (ABSOLUTE) 0.4 0.0 - 0.4 x10E3/uL   Basophils Absolute 0.1 0.0 - 0.2 x10E3/uL   Immature Granulocytes 1 Not Estab. %   Immature Grans (Abs) 0.0 0.0 - 0.1 x10E3/uL  Urinalysis, Routine w reflex microscopic  Result Value Ref Range   Specific Gravity, UA 1.030 1.005 - 1.030   pH, UA 5.5 5.0 - 7.5   Color, UA Yellow Yellow   Appearance Ur Clear Clear   Leukocytes,UA Negative Negative   Protein,UA Negative Negative/Trace   Glucose, UA Negative Negative   Ketones, UA Negative Negative   RBC, UA Trace (A) Negative   Bilirubin, UA Negative Negative   Urobilinogen, Ur 0.2 0.2 - 1.0 mg/dL   Nitrite, UA Negative Negative   Microscopic Examination See below:       Assessment & Plan:   Problem List Items Addressed This Visit       Digestive   GERD without esophagitis    Under good control on current regimen. Continue current regimen. Continue to monitor. Call with any concerns. Refills given. Labs drawn today.        Relevant Medications   omeprazole (PRILOSEC) 40 MG capsule     Other   Depression    Under good control on current regimen. Continue current regimen. Continue to monitor. Call with any concerns. Refills given. Labs drawn today.        Relevant Medications   buPROPion (WELLBUTRIN XL) 300 MG 24 hr tablet   escitalopram (LEXAPRO) 20 MG tablet   LORazepam (ATIVAN) 0.5 MG tablet   Anxiety    Under good control on current regimen. Continue current regimen. Continue to monitor. Call with any concerns. Refills given. Labs drawn today.        Relevant Medications   buPROPion (WELLBUTRIN XL) 300 MG 24 hr tablet   escitalopram (LEXAPRO) 20 MG tablet   LORazepam (ATIVAN) 0.5 MG  tablet   Hyperlipidemia, mixed - Primary    Rechecking labs today. Await results. Treat as needed.      Relevant Orders   Comprehensive metabolic panel   Lipid Panel w/o Chol/HDL Ratio   Vitamin D deficiency    Under good control on current regimen. Continue current regimen. Continue to monitor. Call with any concerns. Refills given. Labs drawn today.        Relevant Orders   VITAMIN D 25 Hydroxy (Vit-D Deficiency, Fractures)   Tinnitus of both ears    Will get her into ENT. Call with any concerns. Continue to monitor.       Relevant Orders   Ambulatory referral to ENT     Follow up plan: Return in about 6 months (around 09/26/2021) for physical.

## 2021-03-29 NOTE — Assessment & Plan Note (Signed)
Will get her into ENT. Call with any concerns. Continue to monitor.

## 2021-03-29 NOTE — Assessment & Plan Note (Signed)
Rechecking labs today. Await results. Treat as needed.  °

## 2021-03-30 LAB — VITAMIN D 25 HYDROXY (VIT D DEFICIENCY, FRACTURES): Vit D, 25-Hydroxy: 54.4 ng/mL (ref 30.0–100.0)

## 2021-03-30 LAB — COMPREHENSIVE METABOLIC PANEL
ALT: 13 IU/L (ref 0–32)
AST: 15 IU/L (ref 0–40)
Albumin/Globulin Ratio: 2 (ref 1.2–2.2)
Albumin: 4.7 g/dL (ref 3.8–4.9)
Alkaline Phosphatase: 72 IU/L (ref 44–121)
BUN/Creatinine Ratio: 13 (ref 9–23)
BUN: 10 mg/dL (ref 6–24)
Bilirubin Total: 0.2 mg/dL (ref 0.0–1.2)
CO2: 22 mmol/L (ref 20–29)
Calcium: 9.8 mg/dL (ref 8.7–10.2)
Chloride: 101 mmol/L (ref 96–106)
Creatinine, Ser: 0.8 mg/dL (ref 0.57–1.00)
Globulin, Total: 2.4 g/dL (ref 1.5–4.5)
Glucose: 99 mg/dL (ref 70–99)
Potassium: 4.4 mmol/L (ref 3.5–5.2)
Sodium: 141 mmol/L (ref 134–144)
Total Protein: 7.1 g/dL (ref 6.0–8.5)
eGFR: 89 mL/min/{1.73_m2} (ref >59)

## 2021-03-30 LAB — LIPID PANEL W/O CHOL/HDL RATIO
Cholesterol, Total: 233 mg/dL — ABNORMAL HIGH (ref 100–199)
HDL: 59 mg/dL (ref >39)
LDL Chol Calc (NIH): 115 mg/dL — ABNORMAL HIGH (ref 0–99)
Triglycerides: 341 mg/dL — ABNORMAL HIGH (ref 0–149)
VLDL Cholesterol Cal: 59 mg/dL — ABNORMAL HIGH (ref 5–40)

## 2021-04-01 DIAGNOSIS — H9313 Tinnitus, bilateral: Secondary | ICD-10-CM | POA: Diagnosis not present

## 2021-04-17 ENCOUNTER — Ambulatory Visit: Payer: BC Managed Care – PPO | Admitting: Family Medicine

## 2021-04-17 ENCOUNTER — Other Ambulatory Visit: Payer: Self-pay

## 2021-05-24 ENCOUNTER — Encounter: Payer: Self-pay | Admitting: Family Medicine

## 2021-05-24 ENCOUNTER — Other Ambulatory Visit: Payer: Self-pay

## 2021-05-24 DIAGNOSIS — Z1231 Encounter for screening mammogram for malignant neoplasm of breast: Secondary | ICD-10-CM

## 2021-05-27 ENCOUNTER — Other Ambulatory Visit: Payer: Self-pay | Admitting: Internal Medicine

## 2021-05-27 DIAGNOSIS — J4530 Mild persistent asthma, uncomplicated: Secondary | ICD-10-CM

## 2021-05-30 ENCOUNTER — Ambulatory Visit
Admission: RE | Admit: 2021-05-30 | Discharge: 2021-05-30 | Disposition: A | Discharge status: Home / Self Care | Payer: BC Managed Care – PPO | Source: Ambulatory Visit | Attending: Family Medicine | Admitting: Family Medicine

## 2021-05-30 DIAGNOSIS — Z1231 Encounter for screening mammogram for malignant neoplasm of breast: Secondary | ICD-10-CM | POA: Diagnosis not present

## 2021-05-31 MED ORDER — SEMAGLUTIDE-WEIGHT MANAGEMENT 1.7 MG/0.75ML ~~LOC~~ SOAJ
1.7000 mg | SUBCUTANEOUS | 0 refills | Status: DC
Start: 2021-08-26 — End: 2021-07-28

## 2021-05-31 MED ORDER — SEMAGLUTIDE-WEIGHT MANAGEMENT 1 MG/0.5ML ~~LOC~~ SOAJ
1.0000 mg | SUBCUTANEOUS | 0 refills | Status: DC
Start: 2021-07-28 — End: 2021-07-28

## 2021-05-31 MED ORDER — SEMAGLUTIDE-WEIGHT MANAGEMENT 2.4 MG/0.75ML ~~LOC~~ SOAJ: 2.4000 mg | SUBCUTANEOUS | 1 refills | Status: DC

## 2021-05-31 MED ORDER — SEMAGLUTIDE-WEIGHT MANAGEMENT 0.25 MG/0.5ML ~~LOC~~ SOAJ
0.2500 mg | SUBCUTANEOUS | 0 refills | Status: AC
Start: 2021-05-31 — End: 2021-06-28

## 2021-05-31 MED ORDER — SEMAGLUTIDE-WEIGHT MANAGEMENT 0.5 MG/0.5ML ~~LOC~~ SOAJ: 0.5000 mg | SUBCUTANEOUS | 0 refills | Status: AC

## 2021-05-31 NOTE — Addendum Note (Signed)
Addended by: Valerie Roys on: 05/31/2021 11:30 AM ? ? Modules accepted: Orders ? ?

## 2021-06-04 ENCOUNTER — Telehealth: Payer: Self-pay

## 2021-06-04 NOTE — Telephone Encounter (Signed)
PA initiated via CoverMyMeds for Wegovy 0.25 MG/0.5ML ?KEY: [id number] ?Waiting on determination  ?

## 2021-06-06 NOTE — Telephone Encounter (Signed)
Medication has been approved, contacted patient to make aware. No answer LVM advising patient medication is approved.  ?

## 2021-07-25 ENCOUNTER — Encounter: Payer: Self-pay | Admitting: Family Medicine

## 2021-07-28 ENCOUNTER — Other Ambulatory Visit: Payer: Self-pay | Admitting: Family Medicine

## 2021-07-28 MED ORDER — SEMAGLUTIDE-WEIGHT MANAGEMENT 1 MG/0.5ML ~~LOC~~ SOAJ: 1.0000 mg | SUBCUTANEOUS | 0 refills | Status: AC

## 2021-07-28 MED ORDER — SEMAGLUTIDE-WEIGHT MANAGEMENT 2.4 MG/0.75ML ~~LOC~~ SOAJ: 2.4000 mg | SUBCUTANEOUS | 1 refills | Status: DC

## 2021-07-28 MED ORDER — SEMAGLUTIDE-WEIGHT MANAGEMENT 1.7 MG/0.75ML ~~LOC~~ SOAJ: 1.7000 mg | SUBCUTANEOUS | 0 refills | Status: AC

## 2021-07-28 NOTE — Telephone Encounter (Signed)
I"m not sure we need to fill out this form as I've sent the medicine electronically, but if we need to we can start it and I'll sign it when I get bck

## 2021-07-29 ENCOUNTER — Telehealth: Payer: Self-pay

## 2021-07-29 NOTE — Telephone Encounter (Signed)
PA initiated via CoverMyMeds for [facility] '1MG'$ /0.5ML  Key: [id number] Medication has been approved, will notify patient via [place].

## 2021-07-31 DIAGNOSIS — J301 Allergic rhinitis due to pollen: Secondary | ICD-10-CM | POA: Diagnosis not present

## 2021-08-01 ENCOUNTER — Ambulatory Visit: Payer: Managed Care, Other (non HMO)

## 2021-08-01 ENCOUNTER — Telehealth: Payer: Self-pay

## 2021-08-01 DIAGNOSIS — J301 Allergic rhinitis due to pollen: Secondary | ICD-10-CM

## 2021-08-01 NOTE — Progress Notes (Signed)
  Aeroallergen Immunotherapy    Patient Details  Name: AMELLIA PANIK MRN: [id number] Date of Birth: Mar 09, 1970  Order 1 of 2  Vial Label: T-G-W  0.20 mL of each antigen: [place], American [place], [place], Dock Sorrel Mix, [place], English [place], Lambs [place], [place] Box Elder, [employer], Pigweed Mix, [employer] Holdings, Privet, Ragweed Mix, B[employer] and [employer], [place] Thistle, Western Water Hemp, and White/Red Mulberry   6.0   mL Extract Subtotal 2.6   mL Normal Saline Diluent 5.0  mL Maintenance Total   Final Concentration above is stated in weight/volume (wt/vol).  Allergen units (AU/ml) biological units (BAU/ml).  The total volume is 5 ml.      Special Instructions: None   Aeroallergen Immunotherapy    Patient Details  Name: AALYIAH CAMBEROS MRN: [id number] Date of Birth: 1970/07/09  Order 2 of 2  Vial Label: DM-C-M  0.20 mL of each antigen: Aureobasidium, Candida, Cat Hair, and Dust Mite Mix   5.0   mL Extract Subtotal 4.2   mL Normal Saline Diluent 5.0  mL Maintenance Total   Final Concentration above is stated in weight/volume (wt/vol).  Allergen units (AU/ml) biological units (BAU/ml).  The total volume is 5 ml.      Special Instructions: None

## 2021-08-01 NOTE — Telephone Encounter (Signed)
Benefits has been checked for allergy serum:  [employer] active 07/15/21-current/1200 ded/ 270.39 met/30% coins/7200 oop/ dx driven/ no auth /ref# [id number]

## 2021-08-13 ENCOUNTER — Other Ambulatory Visit: Payer: Self-pay | Admitting: Internal Medicine

## 2021-08-13 DIAGNOSIS — G4733 Obstructive sleep apnea (adult) (pediatric): Secondary | ICD-10-CM

## 2021-08-15 ENCOUNTER — Ambulatory Visit (INDEPENDENT_AMBULATORY_CARE_PROVIDER_SITE_OTHER): Payer: Managed Care, Other (non HMO)

## 2021-08-15 DIAGNOSIS — J301 Allergic rhinitis due to pollen: Secondary | ICD-10-CM

## 2021-09-05 ENCOUNTER — Other Ambulatory Visit: Payer: Self-pay | Admitting: Family Medicine

## 2021-09-05 NOTE — Telephone Encounter (Signed)
Requested Prescriptions  Pending Prescriptions Disp Refills  . buPROPion (WELLBUTRIN XL) 300 MG 24 hr tablet [Pharmacy Med Name: BUPROPION HCL XL 300 MG TABLET] 90 tablet 1    Sig: TAKE 1 TABLET BY MOUTH DAILY     Psychiatry: Antidepressants - bupropion Passed - 09/05/2021 11:19 AM      Passed - Cr in normal range and within 360 days    Creatinine  Date Value Ref Range Status  03/04/2012 0.73 0.60 - 1.30 mg/dL Final   Creatinine, Ser  Date Value Ref Range Status  03/29/2021 0.80 0.57 - 1.00 mg/dL Final         Passed - AST in normal range and within 360 days    AST  Date Value Ref Range Status  03/29/2021 15 0 - 40 IU/L Final         Passed - ALT in normal range and within 360 days    ALT  Date Value Ref Range Status  03/29/2021 13 0 - 32 IU/L Final         Passed - Completed PHQ-2 or PHQ-9 in the last 360 days      Passed - Last BP in normal range    BP Readings from Last 1 Encounters:  03/29/21 125/78         Passed - Valid encounter within last 6 months    Recent Outpatient Visits          5 months ago Hyperlipidemia, mixed   [facility] [place], [place], DO   7 months ago Hot flashes   [place], [place], DO   8 months ago [place], [place], DO   11 months ago Routine general medical examination at a health care facility   [facility], [place], DO   1 year ago Seasonal allergic rhinitis due to pollen   [facility], Scheryl Darter, NP      Future Appointments            In 3 weeks Wynetta Emery, Barb Merino, DO [place], [facility]

## 2021-09-16 ENCOUNTER — Encounter: Payer: Self-pay | Admitting: Internal Medicine

## 2021-09-22 ENCOUNTER — Telehealth: Payer: Self-pay

## 2021-09-22 NOTE — Telephone Encounter (Signed)
PA for [facility] sent 09/22/21 @ 10:45 pm

## 2021-09-24 ENCOUNTER — Other Ambulatory Visit: Payer: Self-pay | Admitting: Family Medicine

## 2021-09-24 NOTE — Telephone Encounter (Signed)
Cindy with [facility] pharmacy is calling in to request refills for patient.   Vitamin D, Ergocalciferol, (DRISDOL) 1.25 MG (50000 UNIT) CAPS capsule   omeprazole (PRILOSEC) 40 MG capsule  montelukast (SINGULAIR) 10 MG tablet  buPROPion (WELLBUTRIN XL) 300 MG 24 hr tablet   escitalopram (LEXAPRO) 20 MG tablet   estradiol (ESTRACE) 0.5 MG tablet     Pharmacy:  [place], [place] Phone:  [phone]  Fax:  [phone]

## 2021-09-25 MED ORDER — OMEPRAZOLE 40 MG PO CPDR: 40.0000 mg | DELAYED_RELEASE_CAPSULE | Freq: Two times a day (BID) | ORAL | 0 refills | Status: DC

## 2021-09-25 MED ORDER — ESCITALOPRAM OXALATE 20 MG PO TABS: 20.0000 mg | ORAL_TABLET | Freq: Every day | ORAL | 0 refills | Status: DC

## 2021-09-25 MED ORDER — MONTELUKAST SODIUM 10 MG PO TABS: ORAL_TABLET | ORAL | 0 refills | Status: DC

## 2021-09-25 MED ORDER — BUPROPION HCL ER (XL) 300 MG PO TB24: ORAL_TABLET | ORAL | 0 refills | Status: DC

## 2021-09-25 MED ORDER — ESTRADIOL 0.5 MG PO TABS: 0.5000 mg | ORAL_TABLET | Freq: Every day | ORAL | 1 refills | Status: DC

## 2021-09-25 NOTE — Telephone Encounter (Signed)
Requested Prescriptions  Pending Prescriptions Disp Refills  . Vitamin D, Ergocalciferol, (DRISDOL) 1.25 MG (50000 UNIT) CAPS capsule 12 capsule     Sig: Take 1 capsule (50,000 Units total) by mouth once a week.     Endocrinology:  Vitamins - Vitamin D Supplementation 2 Failed - 09/24/2021 10:11 AM      Failed - Manual Review: Route requests for 50,000 IU strength to the provider      Passed - Ca in normal range and within 360 days    Calcium  Date Value Ref Range Status  03/29/2021 9.8 8.7 - 10.2 mg/dL Final   Calcium, Total  Date Value Ref Range Status  03/04/2012 9.5 8.5 - 10.1 mg/dL Final         Passed - Vitamin D in normal range and within 360 days    Vit D, 25-Hydroxy  Date Value Ref Range Status  03/29/2021 54.4 30.0 - 100.0 ng/mL Final    Comment:    Vitamin D deficiency has been defined by the [place] practice guideline as a level of serum 25-OH vitamin D less than 20 ng/mL (1,2). The Endocrine Society went on to further define vitamin D insufficiency as a level between 21 and 29 ng/mL (2). 1. IOM (Institute of Medicine). 2010. Dietary reference    intakes for calcium and D. [place]: The    [employer]. 2. Holick MF, Binkley , Bischoff-Ferrari HA, et al.    Evaluation, treatment, and prevention of vitamin D    deficiency: an Endocrine Society clinical practice    guideline. JCEM. 2011 Jul; 96(7):1911-30.          Passed - Valid encounter within last 12 months    Recent Outpatient Visits          6 months ago Hyperlipidemia, mixed   [place], [place], DO   8 months ago Hot flashes   [place], [place], DO   9 months ago [place], [place], DO   12 months ago Routine general medical examination at a health care facility   [facility], [place], DO   1 year ago Seasonal allergic rhinitis due to  pollen   [facility], Scheryl Darter, NP      Future Appointments            In 3 weeks Wynetta Emery, Barb Merino, DO [place], [facility]           . omeprazole (PRILOSEC) 40 MG capsule 180 capsule 0    Sig: Take 1 capsule (40 mg total) by mouth in the morning and at bedtime. For 30 days.     Gastroenterology: Proton Pump Inhibitors Passed - 09/24/2021 10:11 AM      Passed - Valid encounter within last 12 months    Recent Outpatient Visits          6 months ago Hyperlipidemia, mixed   [facility] [place], [place], DO   8 months ago Hot flashes   [place], [place], DO   9 months ago [place], [place], DO   12 months ago Routine general medical examination at a health care facility   [facility], [place], DO   1 year ago Seasonal allergic rhinitis due to pollen   [facility], Scheryl Darter, NP  Future Appointments            In 3 weeks Johnson, [name], DO [facility], [facility]           . montelukast (SINGULAIR) 10 MG tablet 90 tablet 0    Sig: TAKE 1 TABLET(10 MG) BY MOUTH DAILY     Pulmonology:  Leukotriene Inhibitors Passed - 09/24/2021 10:11 AM      Passed - Valid encounter within last 12 months    Recent Outpatient Visits          6 months ago Hyperlipidemia, mixed   [facility] [place], [place], DO   8 months ago Hot flashes   [employer], [place], DO   9 months ago [place], [name], DO   12 months ago Routine general medical examination at a health care facility   [facility], [place], DO   1 year ago Seasonal allergic rhinitis due to pollen   [facility], Scheryl Darter, NP      Future Appointments            In 3 weeks Johnson, [name], DO [place], [facility]           . escitalopram  (LEXAPRO) 20 MG tablet 90 tablet 0    Sig: Take 1 tablet (20 mg total) by mouth daily.     Psychiatry:  Antidepressants - SSRI Passed - 09/24/2021 10:11 AM      Passed - Completed PHQ-2 or PHQ-9 in the last 360 days      Passed - Valid encounter within last 6 months    Recent Outpatient Visits          6 months ago Hyperlipidemia, mixed   [facility] [place], [place], DO   8 months ago Hot flashes   [place], [place], DO   9 months ago [place], [name], DO   12 months ago Routine general medical examination at a health care facility   [facility], [place], DO   1 year ago Seasonal allergic rhinitis due to pollen   [facility], Scheryl Darter, NP      Future Appointments            In 3 weeks Wynetta Emery, Barb Merino, DO [place], [facility]           . estradiol (ESTRACE) 0.5 MG tablet 90 tablet 1    Sig: Take 1 tablet (0.5 mg total) by mouth daily.     OB/GYN:  Estrogens Passed - 09/24/2021 10:11 AM      Passed - Mammogram is up-to-date per Health Maintenance      Passed - Last BP in normal range    BP Readings from Last 1 Encounters:  03/29/21 125/78         Passed - Valid encounter within last 12 months    Recent Outpatient Visits          6 months ago Hyperlipidemia, mixed   [facility] [place], [place], DO   8 months ago Hot flashes   [place], [place], DO   9 months ago [place], DO   12 months ago Routine general medical examination at a health care facility   [facility], [place], DO  1 year ago Seasonal allergic rhinitis due to pollen   [facility], Scheryl Darter, NP      Future Appointments            In 3 weeks Johnson, [name], DO [facility], [facility]           . buPROPion (WELLBUTRIN XL) 300 MG 24  hr tablet 90 tablet 0    Sig: TAKE 1 TABLET BY MOUTH DAILY     Psychiatry: Antidepressants - bupropion Passed - 09/24/2021 10:11 AM      Passed - Cr in normal range and within 360 days    Creatinine  Date Value Ref Range Status  03/04/2012 0.73 0.60 - 1.30 mg/dL Final   Creatinine, Ser  Date Value Ref Range Status  03/29/2021 0.80 0.57 - 1.00 mg/dL Final         Passed - AST in normal range and within 360 days    AST  Date Value Ref Range Status  03/29/2021 15 0 - 40 IU/L Final         Passed - ALT in normal range and within 360 days    ALT  Date Value Ref Range Status  03/29/2021 13 0 - 32 IU/L Final         Passed - Completed PHQ-2 or PHQ-9 in the last 360 days      Passed - Last BP in normal range    BP Readings from Last 1 Encounters:  03/29/21 125/78         Passed - Valid encounter within last 6 months    Recent Outpatient Visits          6 months ago Hyperlipidemia, mixed   [facility] [place], [place], DO   8 months ago Hot flashes   [employer], [place], DO   9 months ago [place], [name], DO   12 months ago Routine general medical examination at a health care facility   [facility] [place], [place] P, DO   1 year ago Seasonal allergic rhinitis due to pollen   [facility], Scheryl Darter, NP      Future Appointments            In 3 weeks Wynetta Emery, Barb Merino, DO [place], [facility]            Pt requests refill to mail order

## 2021-09-25 NOTE — Telephone Encounter (Signed)
Requested medication (s) are due for refill today: no  Requested medication (s) are on the active medication list: yes  Last refill:  03/29/21 #12 4 RF  Future visit scheduled: yes  Notes to clinic: NT not delegated to refill or refuse- please reorder and send to mail order ([facility])   Requested Prescriptions  Pending Prescriptions Disp Refills   Vitamin D, Ergocalciferol, (DRISDOL) 1.25 MG (50000 UNIT) CAPS capsule 12 capsule     Sig: Take 1 capsule (50,000 Units total) by mouth once a week.     Endocrinology:  Vitamins - Vitamin D Supplementation 2 Failed - 09/24/2021 10:11 AM      Failed - Manual Review: Route requests for 50,000 IU strength to the provider      Passed - Ca in normal range and within 360 days    Calcium  Date Value Ref Range Status  03/29/2021 9.8 8.7 - 10.2 mg/dL Final   Calcium, Total  Date Value Ref Range Status  03/04/2012 9.5 8.5 - 10.1 mg/dL Final         Passed - Vitamin D in normal range and within 360 days    Vit D, 25-Hydroxy  Date Value Ref Range Status  03/29/2021 54.4 30.0 - 100.0 ng/mL Final    Comment:    Vitamin D deficiency has been defined by the [place] practice guideline as a level of serum 25-OH vitamin D less than 20 ng/mL (1,2). The Endocrine Society went on to further define vitamin D insufficiency as a level between 21 and 29 ng/mL (2). 1. IOM (Institute of Medicine). 2010. Dietary reference    intakes for calcium and D. [place]: The    [employer]. 2. Holick MF, Binkley [place], Bischoff-Ferrari HA, et al.    Evaluation, treatment, and prevention of vitamin D    deficiency: an Endocrine Society clinical practice    guideline. JCEM. 2011 Jul; 96(7):1911-30.          Passed - Valid encounter within last 12 months    Recent Outpatient Visits           6 months ago Hyperlipidemia, mixed   [place], [place], DO   8 months ago Hot flashes    [place], [place], DO   9 months ago [place], [place], DO   12 months ago Routine general medical examination at a health care facility   [facility], [place], DO   1 year ago Seasonal allergic rhinitis due to pollen   [facility], Scheryl Darter, NP       Future Appointments             In 3 weeks Wynetta Emery, Barb Merino, DO [place], [facility]            Signed Prescriptions Disp Refills   omeprazole (PRILOSEC) 40 MG capsule 180 capsule 0    Sig: Take 1 capsule (40 mg total) by mouth in the morning and at bedtime. For 30 days.     Gastroenterology: Proton Pump Inhibitors Passed - 09/24/2021 10:11 AM      Passed - Valid encounter within last 12 months    Recent Outpatient Visits           6 months ago Hyperlipidemia, mixed   [facility] [place], [name], DO   8 months ago Hot flashes   [facility] [place], [name], DO   9 months ago [place], [place], DO   12 months ago Routine general medical examination at a health care facility   [facility], [place], DO   1 year ago Seasonal allergic rhinitis due to pollen   [place], NP       Future Appointments             In 3 weeks Johnson, [name], DO [place], [facility]             montelukast (SINGULAIR) 10 MG tablet 90 tablet 0    Sig: TAKE 1 TABLET(10 MG) BY MOUTH DAILY     Pulmonology:  Leukotriene Inhibitors Passed - 09/24/2021 10:11 AM      Passed - Valid encounter within last 12 months    Recent Outpatient Visits           6 months ago Hyperlipidemia, mixed   [place], [place], DO   8 months ago Hot flashes   [employer], [place], DO   9 months ago [place], [name], DO   12 months ago  Routine general medical examination at a health care facility   [facility], [place], DO   1 year ago Seasonal allergic rhinitis due to pollen   [facility], Scheryl Darter, NP       Future Appointments             In 3 weeks Johnson, [name], DO [place], [facility]             escitalopram (LEXAPRO) 20 MG tablet 90 tablet 0    Sig: Take 1 tablet (20 mg total) by mouth daily.     Psychiatry:  Antidepressants - SSRI Passed - 09/24/2021 10:11 AM      Passed - Completed PHQ-2 or PHQ-9 in the last 360 days      Passed - Valid encounter within last 6 months    Recent Outpatient Visits           6 months ago Hyperlipidemia, mixed   [facility] [place], [place], DO   8 months ago Hot flashes   [place], [place], DO   9 months ago [place], [name], DO   12 months ago Routine general medical examination at a health care facility   [facility], [place], DO   1 year ago Seasonal allergic rhinitis due to pollen   [facility], Scheryl Darter, NP       Future Appointments             In 3 weeks Wynetta Emery, [name], DO [place], [facility]             estradiol (ESTRACE) 0.5 MG tablet 90 tablet 1    Sig: Take 1 tablet (0.5 mg total) by mouth daily.     OB/GYN:  Estrogens Passed - 09/24/2021 10:11 AM      Passed - Mammogram is up-to-date per Health Maintenance      Passed - Last BP in normal range    BP Readings from Last 1 Encounters:  03/29/21 125/78         Passed - Valid encounter within last 12 months  Recent Outpatient Visits           6 months ago Hyperlipidemia, mixed   [facility] [place], [name], DO   8 months ago Hot flashes   [place], [place], DO   9 months ago [place], [place], DO   12 months ago Routine  general medical examination at a health care facility   [facility], [place], DO   1 year ago Seasonal allergic rhinitis due to pollen   [facility], Scheryl Darter, NP       Future Appointments             In 3 weeks Johnson, [name], DO [place] [facility]             buPROPion (WELLBUTRIN XL) 300 MG 24 hr tablet 90 tablet 0    Sig: TAKE 1 TABLET BY MOUTH DAILY     Psychiatry: Antidepressants - bupropion Passed - 09/24/2021 10:11 AM      Passed - Cr in normal range and within 360 days    Creatinine  Date Value Ref Range Status  03/04/2012 0.73 0.60 - 1.30 mg/dL Final   Creatinine, Ser  Date Value Ref Range Status  03/29/2021 0.80 0.57 - 1.00 mg/dL Final         Passed - AST in normal range and within 360 days    AST  Date Value Ref Range Status  03/29/2021 15 0 - 40 IU/L Final         Passed - ALT in normal range and within 360 days    ALT  Date Value Ref Range Status  03/29/2021 13 0 - 32 IU/L Final         Passed - Completed PHQ-2 or PHQ-9 in the last 360 days      Passed - Last BP in normal range    BP Readings from Last 1 Encounters:  03/29/21 125/78         Passed - Valid encounter within last 6 months    Recent Outpatient Visits           6 months ago Hyperlipidemia, mixed   [facility] [place], [place], DO   8 months ago Hot flashes   [employer], [place], DO   9 months ago [place], [name], DO   12 months ago Routine general medical examination at a health care facility   [facility], [place], DO   1 year ago Seasonal allergic rhinitis due to pollen   [facility], Scheryl Darter, NP       Future Appointments             In 3 weeks Wynetta Emery, Barb Merino, DO [place], [facility]

## 2021-09-26 ENCOUNTER — Encounter: Payer: BC Managed Care – PPO | Admitting: Family Medicine

## 2021-09-27 ENCOUNTER — Encounter: Payer: BC Managed Care – PPO | Admitting: Family Medicine

## 2021-10-07 ENCOUNTER — Other Ambulatory Visit: Payer: Self-pay

## 2021-10-07 DIAGNOSIS — J4530 Mild persistent asthma, uncomplicated: Secondary | ICD-10-CM

## 2021-10-07 MED ORDER — ROFLUMILAST 500 MCG PO TABS: 500.0000 ug | ORAL_TABLET | Freq: Every day | ORAL | 3 refills | Status: DC

## 2021-10-17 ENCOUNTER — Encounter: Payer: Self-pay | Admitting: Family Medicine

## 2021-10-17 ENCOUNTER — Ambulatory Visit (INDEPENDENT_AMBULATORY_CARE_PROVIDER_SITE_OTHER): Payer: Managed Care, Other (non HMO) | Admitting: Family Medicine

## 2021-10-17 ENCOUNTER — Other Ambulatory Visit (HOSPITAL_COMMUNITY)
Admission: RE | Admit: 2021-10-17 | Discharge: 2021-10-17 | Disposition: A | Discharge status: Home / Self Care | Payer: Managed Care, Other (non HMO) | Source: Ambulatory Visit | Attending: Family Medicine | Admitting: Family Medicine

## 2021-10-17 VITALS — BP 105/67 | HR 70 | Temp 98.1°F | Ht 68.0 in | Wt 173.7 lb

## 2021-10-17 DIAGNOSIS — F419 Anxiety disorder, unspecified: Secondary | ICD-10-CM | POA: Diagnosis not present

## 2021-10-17 DIAGNOSIS — E663 Overweight: Secondary | ICD-10-CM

## 2021-10-17 DIAGNOSIS — E559 Vitamin D deficiency, unspecified: Secondary | ICD-10-CM

## 2021-10-17 DIAGNOSIS — K219 Gastro-esophageal reflux disease without esophagitis: Secondary | ICD-10-CM

## 2021-10-17 DIAGNOSIS — Z0000 Encounter for general adult medical examination without abnormal findings: Secondary | ICD-10-CM

## 2021-10-17 DIAGNOSIS — E538 Deficiency of other specified B group vitamins: Secondary | ICD-10-CM

## 2021-10-17 DIAGNOSIS — F330 Major depressive disorder, recurrent, mild: Secondary | ICD-10-CM

## 2021-10-17 LAB — MICROSCOPIC EXAMINATION

## 2021-10-17 LAB — URINALYSIS, ROUTINE W REFLEX MICROSCOPIC
Bilirubin, UA: NEGATIVE
Glucose, UA: NEGATIVE
Ketones, UA: NEGATIVE
Leukocytes,UA: NEGATIVE
Nitrite, UA: NEGATIVE
Protein,UA: NEGATIVE
Specific Gravity, UA: >1.03 — ABNORMAL HIGH (ref 1.005–1.030)
Urobilinogen, Ur: 0.2 mg/dL (ref 0.2–1.0)
pH, UA: 5.5 (ref 5.0–7.5)

## 2021-10-17 MED ORDER — OMEPRAZOLE 40 MG PO CPDR: 40.0000 mg | DELAYED_RELEASE_CAPSULE | Freq: Two times a day (BID) | ORAL | 1 refills | Status: DC

## 2021-10-17 MED ORDER — ALBUTEROL SULFATE HFA 108 (90 BASE) MCG/ACT IN AERS: 1.0000 | INHALATION_SPRAY | RESPIRATORY_TRACT | 6 refills | Status: DC | PRN

## 2021-10-17 MED ORDER — SEMAGLUTIDE-WEIGHT MANAGEMENT 2.4 MG/0.75ML ~~LOC~~ SOAJ
2.4000 mg | SUBCUTANEOUS | 1 refills | Status: DC
Start: 2021-10-17 — End: 2022-04-21

## 2021-10-17 MED ORDER — ESCITALOPRAM OXALATE 20 MG PO TABS: 20.0000 mg | ORAL_TABLET | Freq: Every day | ORAL | 1 refills | Status: DC

## 2021-10-17 MED ORDER — MONTELUKAST SODIUM 10 MG PO TABS
ORAL_TABLET | ORAL | 1 refills | Status: DC
Start: 2021-10-17 — End: 2022-04-21

## 2021-10-17 MED ORDER — NAPROXEN 500 MG PO TABS
ORAL_TABLET | ORAL | 1 refills | Status: DC
Start: 2021-10-17 — End: 2022-04-21

## 2021-10-17 MED ORDER — BUPROPION HCL ER (XL) 300 MG PO TB24: ORAL_TABLET | ORAL | 1 refills | Status: DC

## 2021-10-17 NOTE — Progress Notes (Signed)
BP 105/67   Pulse 70   Temp 98.1 F (36.7 C)   Ht 5' 8"  (1.727 m)   Wt 173 lb 11.2 oz (78.8 kg)   SpO2 99%   BMI 26.41 kg/m    Subjective:    Patient ID: [name], female    DOB: 06-05-1970, 51 y.o.   MRN: [id number]  HPI: [name] is a 51 y.o. female presenting on 10/17/2021 for comprehensive medical examination. Current medical complaints include:  Obesity Duration: chronic  Previous attempts at weight loss: yes Complications of obesity: depression, GERD Peak weight: 192 Weight loss goal: to maintain Weight loss to date: 19lbs Requesting obesity pharmacotherapy: yes Current weight loss supplements/medications: yes Previous weight loss supplements/meds: no  ANXIETY/DEPRESSION Duration: chronic Status:controlled Anxious mood: no  Excessive worrying: no Irritability: no  Sweating: no Nausea: no Palpitations:no Hyperventilation: no Panic attacks: no Agoraphobia: no  Obscessions/compulsions: no Depressed mood: no    10/17/2021    1:23 PM 03/29/2021   10:09 AM 01/14/2021    4:18 PM 12/17/2020   11:13 AM 09/26/2020   11:10 AM  Depression screen PHQ 2/9  Decreased Interest 0 0 0 0 0  Down, Depressed, Hopeless 0 0 0 0 0  PHQ - 2 Score 0 0 0 0 0  Altered sleeping 0 1 0 1 1  Tired, decreased energy 0 1 0 1 1  Change in appetite 0 0 0 0 0  Feeling bad or failure about yourself  0 0 0 0 0  Trouble concentrating 0 1 0 0 0  Moving slowly or fidgety/restless 0 0 0 0 0  Suicidal thoughts 0 0 0 0 0  PHQ-9 Score 0 3 0 2 2  Difficult doing work/chores Not difficult at all Somewhat difficult Not difficult at all Not difficult at all Not difficult at all   Anhedonia: no Weight changes: no Insomnia: no   Hypersomnia: no Fatigue/loss of energy: no Feelings of worthlessness: no Feelings of guilt: no Impaired concentration/indecisiveness: no Suicidal ideations: no  Crying spells: no Recent Stressors/Life Changes: no   Relationship problems: no   Family stress:  no     Financial stress: no    Job stress: no    Recent death/loss: no   She currently lives with: husband Menopausal Symptoms: no  Depression Screen done today and results listed below:     10/17/2021    1:23 PM 03/29/2021   10:09 AM 01/14/2021    4:18 PM 12/17/2020   11:13 AM 09/26/2020   11:10 AM  Depression screen PHQ 2/9  Decreased Interest 0 0 0 0 0  Down, Depressed, Hopeless 0 0 0 0 0  PHQ - 2 Score 0 0 0 0 0  Altered sleeping 0 1 0 1 1  Tired, decreased energy 0 1 0 1 1  Change in appetite 0 0 0 0 0  Feeling bad or failure about yourself  0 0 0 0 0  Trouble concentrating 0 1 0 0 0  Moving slowly or fidgety/restless 0 0 0 0 0  Suicidal thoughts 0 0 0 0 0  PHQ-9 Score 0 3 0 2 2  Difficult doing work/chores Not difficult at all Somewhat difficult Not difficult at all Not difficult at all Not difficult at all    Past Medical History:  Past Medical History:  Diagnosis Date  . Allergic rhinitis   . Anxiety   . Asthma   . Constipation   . COPD (chronic obstructive pulmonary disease) (  [place])   . Depression   . Hematuria   . Insomnia   . Reflux     Surgical History:  Past Surgical History:  Procedure Laterality Date  . APPENDECTOMY    . ESOPHAGOGASTRODUODENOSCOPY (EGD) WITH PROPOFOL N/A 06/14/2019   Procedure: ESOPHAGOGASTRODUODENOSCOPY (EGD) WITH PROPOFOL;  Surgeon: Lucilla Lame, MD;  Location: [facility] ENDOSCOPY;  Service: Endoscopy;  Laterality: N/A;  . OVARIAN CYST DRAINAGE Right 12/25/2001  . supracervial hysterectomy    . TONSILLECTOMY    . TUBAL LIGATION      Medications:  Current Outpatient Medications on File Prior to Visit  Medication Sig  . cetirizine (ZYRTEC) 10 MG tablet Take 10 mg by mouth daily.  . cyclobenzaprine (FLEXERIL) 10 MG tablet Take 1 tablet (10 mg total) by mouth 3 (three) times daily as needed for muscle spasms.  Marland Kitchen EPINEPHrine (EPIPEN 2-PAK) 0.3 mg/0.3 mL IJ SOAJ injection Inject 0.3 mLs (0.3 mg total) into the muscle as needed for  anaphylaxis.  Marland Kitchen estradiol (ESTRACE) 0.5 MG tablet Take 1 tablet (0.5 mg total) by mouth daily.  . Insulin Syringe-Needle U-100 (INSULIN SYRINGE 1CC/31GX5/16") 31G X 5/16" 1 ML MISC USE FOR ALLERGIES TWO TIMES A WEEK  . LORazepam (ATIVAN) 0.5 MG tablet Take 1 tablet (0.5 mg total) by mouth daily as needed for anxiety.  . roflumilast (DALIRESP) 500 MCG TABS tablet Take 1 tablet (500 mcg total) by mouth daily.  Marland Kitchen triamcinolone ointment (KENALOG) 0.1 % APPLY TO THE AFFECTED AREA LEG, ARM, AND TRUNK TWICE DAILY. AVOID USE ON FACE  . Vitamin D, Ergocalciferol, (DRISDOL) 1.25 MG (50000 UNIT) CAPS capsule Take 1 capsule (50,000 Units total) by mouth once a week.  . zolpidem (AMBIEN) 10 MG tablet TAKE ONE TABLET BY MOUTH EVERY NIGHT AT BEDTIME AS NEEDED FOR SLEEP   No current facility-administered medications on file prior to visit.    Allergies:  Allergies  Allergen Reactions  . Penicillins Anaphylaxis  . Sulfa Antibiotics Other (See Comments)    Bad taste in mouth    Social History:  Social History   Socioeconomic History  . Marital status: Married    Spouse name: Not on file  . Number of children: Not on file  . Years of education: Not on file  . Highest education level: Not on file  Occupational History  . Not on file  Tobacco Use  . Smoking status: Never  . Smokeless tobacco: Never  Vaping Use  . Vaping Use: Never used  Substance and Sexual Activity  . Alcohol use: Yes    Comment: occasional  . Drug use: No  . Sexual activity: Yes  Other Topics Concern  . Not on file  Social History Narrative  . Not on file   Social Determinants of Health   Financial Resource Strain: Not on file  Food Insecurity: Not on file  Transportation Needs: Not on file  Physical Activity: Not on file  Stress: Not on file  Social Connections: Not on file  Intimate Partner Violence: Not on file   Social History   Tobacco Use  Smoking Status Never  Smokeless Tobacco Never   Social History    Substance and Sexual Activity  Alcohol Use Yes   Comment: occasional    Family History:  Family History  Problem Relation Age of Onset  . Cancer Mother        ovarian  . Stroke Father   . Cancer Maternal Uncle        lung  . Diabetes Maternal  Grandmother   . Emphysema Maternal Grandfather   . Diabetes Paternal Grandmother   . Stroke Paternal Grandfather   . Breast cancer Neg Hx     Past medical history, surgical history, medications, allergies, family history and social history reviewed with patient today and changes made to appropriate areas of the chart.   Review of Systems  Constitutional: Negative.   HENT: Negative.    Eyes: Negative.   Respiratory: Negative.    Cardiovascular: Negative.   Gastrointestinal: Negative.   Genitourinary: Negative.   Musculoskeletal: Negative.   Skin: Negative.   Neurological: Negative.   Endo/Heme/Allergies: Negative.   Psychiatric/Behavioral: Negative.     All other ROS negative except what is listed above and in the HPI.      Objective:    BP 105/67   Pulse 70   Temp 98.1 F (36.7 C)   Ht 5' 8"  (1.727 m)   Wt 173 lb 11.2 oz (78.8 kg)   SpO2 99%   BMI 26.41 kg/m   Wt Readings from Last 3 Encounters:  10/17/21 173 lb 11.2 oz (78.8 kg)  03/29/21 188 lb (85.3 kg)  12/17/20 190 lb (86.2 kg)    Physical Exam Vitals and nursing note reviewed. Exam conducted with a chaperone present.  Constitutional:      General: She is not in acute distress.    Appearance: Normal appearance. She is normal weight. She is not ill-appearing, toxic-appearing or diaphoretic.  HENT:     Head: Normocephalic and atraumatic.     Right Ear: Tympanic membrane, ear canal and external ear normal. There is no impacted cerumen.     Left Ear: Tympanic membrane, ear canal and external ear normal. There is no impacted cerumen.     Nose: Nose normal. No congestion or rhinorrhea.     Mouth/Throat:     Mouth: Mucous membranes are moist.     Pharynx:  Oropharynx is clear. No oropharyngeal exudate or posterior oropharyngeal erythema.  Eyes:     General: No scleral icterus.       Right eye: No discharge.        Left eye: No discharge.     Extraocular Movements: Extraocular movements intact.     Conjunctiva/sclera: Conjunctivae normal.     Pupils: Pupils are equal, round, and reactive to light.  Neck:     Vascular: No carotid bruit.  Cardiovascular:     Rate and Rhythm: Normal rate and regular rhythm.     Pulses: Normal pulses.     Heart sounds: No murmur heard.    No friction rub. No gallop.  Pulmonary:     Effort: Pulmonary effort is normal. No respiratory distress.     Breath sounds: Normal breath sounds. No stridor. No wheezing, rhonchi or rales.  Chest:     Chest wall: No tenderness.  Breasts:    Right: Normal.     Left: Normal.  Abdominal:     General: Abdomen is flat. Bowel sounds are normal. There is no distension.     Palpations: Abdomen is soft. There is no mass.     Tenderness: There is no abdominal tenderness. There is no right CVA tenderness, left CVA tenderness, guarding or rebound.     Hernia: No hernia is present. There is no hernia in the left inguinal area or right inguinal area.  Genitourinary:    Labia:        Right: No rash, tenderness, lesion or injury.        Left:  No rash, tenderness, lesion or injury.      Urethra: No prolapse, urethral pain, urethral swelling or urethral lesion.     Cervix: Normal.     Uterus: Absent.      Adnexa: Right adnexa normal and left adnexa normal.  Musculoskeletal:        General: No swelling, tenderness, deformity or signs of injury.     Cervical back: Normal range of motion and neck supple. No rigidity. No muscular tenderness.     Right lower leg: No edema.     Left lower leg: No edema.  Lymphadenopathy:     Cervical: No cervical adenopathy.  Skin:    General: Skin is warm and dry.     Capillary Refill: Capillary refill takes less than 2 seconds.     Coloration: Skin  is not jaundiced or pale.     Findings: No bruising, erythema, lesion or rash.  Neurological:     General: No focal deficit present.     Mental Status: She is alert and oriented to person, place, and time. Mental status is at baseline.     Cranial Nerves: No cranial nerve deficit.     Sensory: No sensory deficit.     Motor: No weakness.     Coordination: Coordination normal.     Gait: Gait normal.     Deep Tendon Reflexes: Reflexes normal.  Psychiatric:        Mood and Affect: Mood normal.        Behavior: Behavior normal.        Thought Content: Thought content normal.        Judgment: Judgment normal.    Results for orders placed or performed in visit on 03/29/21  Comprehensive metabolic panel  Result Value Ref Range   Glucose 99 70 - 99 mg/dL   BUN 10 6 - 24 mg/dL   Creatinine, Ser 0.80 0.57 - 1.00 mg/dL   eGFR 89 >59 mL/min/1.73   BUN/Creatinine Ratio 13 9 - 23   Sodium 141 134 - 144 mmol/L   Potassium 4.4 3.5 - 5.2 mmol/L   Chloride 101 96 - 106 mmol/L   CO2 22 20 - 29 mmol/L   Calcium 9.8 8.7 - 10.2 mg/dL   Total Protein 7.1 6.0 - 8.5 g/dL   Albumin 4.7 3.8 - 4.9 g/dL   Globulin, Total 2.4 1.5 - 4.5 g/dL   Albumin/Globulin Ratio 2.0 1.2 - 2.2   Bilirubin Total 0.2 0.0 - 1.2 mg/dL   Alkaline Phosphatase 72 44 - 121 IU/L   AST 15 0 - 40 IU/L   ALT 13 0 - 32 IU/L  Lipid Panel w/o Chol/HDL Ratio  Result Value Ref Range   Cholesterol, Total 233 (H) 100 - 199 mg/dL   Triglycerides 341 (H) 0 - 149 mg/dL   HDL 59 >39 mg/dL   VLDL Cholesterol Cal 59 (H) 5 - 40 mg/dL   LDL Chol Calc (NIH) 115 (H) 0 - 99 mg/dL  VITAMIN D 25 Hydroxy (Vit-D Deficiency, Fractures)  Result Value Ref Range   Vit D, 25-Hydroxy 54.4 30.0 - 100.0 ng/mL      Assessment & Plan:   Problem List Items Addressed This Visit       Digestive   GERD without esophagitis    Under good control on current regimen. Continue current regimen. Continue to monitor. Call with any concerns. Refills given.  Labs drawn today.       Relevant Medications   omeprazole (PRILOSEC) 40  MG capsule     Other   Depression    Under good control on current regimen. Continue current regimen. Continue to monitor. Call with any concerns. Refills given. Labs drawn today.       Relevant Medications   escitalopram (LEXAPRO) 20 MG tablet   buPROPion (WELLBUTRIN XL) 300 MG 24 hr tablet   Anxiety    Under good control on current regimen. Continue current regimen. Continue to monitor. Call with any concerns. Refills given. Labs drawn today.       Relevant Medications   escitalopram (LEXAPRO) 20 MG tablet   buPROPion (WELLBUTRIN XL) 300 MG 24 hr tablet   Vitamin B12 deficiency (non anemic)    Rechecking labs today. Await results.      Relevant Orders   B12   Vitamin D deficiency    Rechecking labs today. Await results.      Relevant Orders   VITAMIN D 25 Hydroxy (Vit-D Deficiency, Fractures)   Other Visit Diagnoses     Routine general medical examination at a health care facility    -  Primary   Vaccines up to date/declined. Screening labs checked today. Pap done. Mammogram and cologuard up to date. Continue diet and exercise. Call with any concerns.    Relevant Orders   CBC with Differential/Platelet   Comprehensive metabolic panel   Lipid Panel w/o Chol/HDL Ratio   Cytology - PAP   Urinalysis, Routine w reflex microscopic   TSH   Overweight       Congratulated patient on 19lb weight loss. Continue diet and exercise. Continue wegovy. Call with any concerns.         Follow up plan: Return in about 6 months (around 04/19/2022), or and ASAP for shave bx of spot on ankle.   LABORATORY TESTING:  - Pap smear: up to date  IMMUNIZATIONS:   - Tdap: Tetanus vaccination status reviewed: last tetanus booster within 10 years. - Influenza: Postponed to flu season - Pneumovax: Not applicable - Prevnar: Not applicable - COVID: Up to date - HPV: Not applicable - Shingrix vaccine:  Refused  SCREENING: -Mammogram: Up to date  - Colonoscopy: Up to date   PATIENT COUNSELING:   Advised to take 1 mg of folate supplement per day if capable of pregnancy.   Sexuality: Discussed sexually transmitted diseases, partner selection, use of condoms, avoidance of unintended pregnancy  and contraceptive alternatives.   Advised to avoid cigarette smoking.  I discussed with the patient that most people either abstain from alcohol or drink within safe limits (<=14/week and <=4 drinks/occasion for males, <=7/weeks and <= 3 drinks/occasion for females) and that the risk for alcohol disorders and other health effects rises proportionally with the number of drinks per week and how often a drinker exceeds daily limits.  Discussed cessation/primary prevention of drug use and availability of treatment for abuse.   Diet: Encouraged to adjust caloric intake to maintain  or achieve ideal body weight, to reduce intake of dietary saturated fat and total fat, to limit sodium intake by avoiding high sodium foods and not adding table salt, and to maintain adequate dietary potassium and calcium preferably from fresh fruits, vegetables, and low-fat dairy products.    stressed the importance of regular exercise  Injury prevention: Discussed safety belts, safety helmets, smoke detector, smoking near bedding or upholstery.   Dental health: Discussed importance of regular tooth brushing, flossing, and dental visits.    NEXT PREVENTATIVE PHYSICAL DUE IN 1 YEAR. Return in about 6  months (around 04/19/2022), or and ASAP for shave bx of spot on ankle.

## 2021-10-17 NOTE — Assessment & Plan Note (Signed)
Under good control on current regimen. Continue current regimen. Continue to monitor. Call with any concerns. Refills given. Labs drawn today.   

## 2021-10-17 NOTE — Assessment & Plan Note (Signed)
Rechecking labs today. Await results.  

## 2021-10-18 LAB — LIPID PANEL W/O CHOL/HDL RATIO
Cholesterol, Total: 252 mg/dL — ABNORMAL HIGH (ref 100–199)
HDL: 75 mg/dL (ref >39)
LDL Chol Calc (NIH): 137 mg/dL — ABNORMAL HIGH (ref 0–99)
Triglycerides: 227 mg/dL — ABNORMAL HIGH (ref 0–149)
VLDL Cholesterol Cal: 40 mg/dL (ref 5–40)

## 2021-10-18 LAB — VITAMIN D 25 HYDROXY (VIT D DEFICIENCY, FRACTURES): Vit D, 25-Hydroxy: 62.4 ng/mL (ref 30.0–100.0)

## 2021-10-18 LAB — COMPREHENSIVE METABOLIC PANEL
ALT: 11 IU/L (ref 0–32)
AST: 14 IU/L (ref 0–40)
Albumin/Globulin Ratio: 2 (ref 1.2–2.2)
Albumin: 4.8 g/dL (ref 3.8–4.9)
Alkaline Phosphatase: 67 IU/L (ref 44–121)
BUN/Creatinine Ratio: 16 (ref 9–23)
BUN: 11 mg/dL (ref 6–24)
Bilirubin Total: 0.3 mg/dL (ref 0.0–1.2)
CO2: 24 mmol/L (ref 20–29)
Calcium: 9.9 mg/dL (ref 8.7–10.2)
Chloride: 101 mmol/L (ref 96–106)
Creatinine, Ser: 0.68 mg/dL (ref 0.57–1.00)
Globulin, Total: 2.4 g/dL (ref 1.5–4.5)
Glucose: 92 mg/dL (ref 70–99)
Potassium: 4.1 mmol/L (ref 3.5–5.2)
Sodium: 141 mmol/L (ref 134–144)
Total Protein: 7.2 g/dL (ref 6.0–8.5)
eGFR: 104 mL/min/{1.73_m2} (ref >59)

## 2021-10-18 LAB — CBC WITH DIFFERENTIAL/PLATELET
Basophils Absolute: 0.1 10*3/uL (ref 0.0–0.2)
Basos: 1 %
EOS (ABSOLUTE): 0.1 10*3/uL (ref 0.0–0.4)
Eos: 2 %
Hematocrit: 38.9 % (ref 34.0–46.6)
Hemoglobin: 13 g/dL (ref 11.1–15.9)
Immature Grans (Abs): 0 10*3/uL (ref 0.0–0.1)
Immature Granulocytes: 1 %
Lymphocytes Absolute: 2.6 10*3/uL (ref 0.7–3.1)
Lymphs: 43 %
MCH: 31.2 pg (ref 26.6–33.0)
MCHC: 33.4 g/dL (ref 31.5–35.7)
MCV: 93 fL (ref 79–97)
Monocytes Absolute: 0.4 10*3/uL (ref 0.1–0.9)
Monocytes: 6 %
Neutrophils Absolute: 2.8 10*3/uL (ref 1.4–7.0)
Neutrophils: 47 %
Platelets: 278 10*3/uL (ref 150–450)
RBC: 4.17 x10E6/uL (ref 3.77–5.28)
RDW: 12.1 % (ref 11.7–15.4)
WBC: 5.9 10*3/uL (ref 3.4–10.8)

## 2021-10-18 LAB — VITAMIN B12: Vitamin B-12: 1645 pg/mL — ABNORMAL HIGH (ref 232–1245)

## 2021-10-18 LAB — TSH: TSH: 2.51 u[IU]/mL (ref 0.450–4.500)

## 2021-10-21 ENCOUNTER — Ambulatory Visit: Payer: Managed Care, Other (non HMO) | Admitting: Family Medicine

## 2021-10-22 ENCOUNTER — Other Ambulatory Visit (HOSPITAL_COMMUNITY)
Admission: RE | Admit: 2021-10-22 | Discharge: 2021-10-22 | Disposition: A | Discharge status: Home / Self Care | Payer: Managed Care, Other (non HMO) | Source: Ambulatory Visit | Attending: Family Medicine | Admitting: Family Medicine

## 2021-10-22 ENCOUNTER — Ambulatory Visit (INDEPENDENT_AMBULATORY_CARE_PROVIDER_SITE_OTHER): Payer: Managed Care, Other (non HMO) | Admitting: Family Medicine

## 2021-10-22 ENCOUNTER — Encounter: Payer: Self-pay | Admitting: Family Medicine

## 2021-10-22 VITALS — BP 104/70 | HR 79 | Temp 97.9°F | Wt 173.7 lb

## 2021-10-22 DIAGNOSIS — D485 Neoplasm of uncertain behavior of skin: Secondary | ICD-10-CM | POA: Insufficient documentation

## 2021-10-22 NOTE — Progress Notes (Signed)
Skin Procedure  Procedure: Informed consent given.  Sterile prep of the area.  Area infiltrated with lidocaine without epinephrine.  Using a surgical blade, part of the upper dermis shaved off and sent  for pathology.  Area cauterized. Pt ed on scarring.     Diagnosis:   ICD-10-CM   1. Neoplasm of uncertain behavior of skin  D48.5 Surgical pathology      Lesion Location/Size: dry nodule about 0.5cm not going away and bleeding Physician: MJ Consent:  Risks, benefits, and alternative treatments discussed and all questions were answered.  Patient elected to proceed and verbal consent obtained.  Description: Area prepped and draped using semi-sterile technique. Area locally anesthetized using 5 cc's of lidocaine 1% plain. Shave biopsy of lesion performed using a dermablade.  Adequate hemostastis achieved using Silver Nitrate. Wound dressed after application of bacitracin ointment.  Post Procedure Instructions: Wound care instructions discussed and patient was instructed to keep area clean and dry.  Signs and symptoms of infection discussed, patient agrees to contact the office ASAP should they occur.  Dressing change recommended every other day.   Follow Up: Return if symptoms worsen or fail to improve.

## 2021-10-23 LAB — CYTOLOGY - PAP
Adequacy: ABSENT
Comment: NEGATIVE
Diagnosis: NEGATIVE
High risk HPV: NEGATIVE

## 2021-10-24 ENCOUNTER — Other Ambulatory Visit: Payer: Self-pay | Admitting: Family Medicine

## 2021-10-24 DIAGNOSIS — C4492 Squamous cell carcinoma of skin, unspecified: Secondary | ICD-10-CM

## 2021-10-24 LAB — SURGICAL PATHOLOGY

## 2021-11-11 ENCOUNTER — Other Ambulatory Visit: Payer: Self-pay | Admitting: Family Medicine

## 2021-11-11 ENCOUNTER — Ambulatory Visit: Payer: Managed Care, Other (non HMO) | Admitting: Internal Medicine

## 2021-11-12 NOTE — Telephone Encounter (Signed)
Singulair 10 mg refill request from [facility] refused because this was sent to mail order pharmacy [facility] on 10/17/2021 #90, 1 refill.

## 2021-11-13 ENCOUNTER — Other Ambulatory Visit: Payer: Self-pay

## 2021-11-13 DIAGNOSIS — G4733 Obstructive sleep apnea (adult) (pediatric): Secondary | ICD-10-CM

## 2021-11-13 MED ORDER — ZOLPIDEM TARTRATE 10 MG PO TABS: ORAL_TABLET | ORAL | 0 refills | Status: DC

## 2021-11-26 ENCOUNTER — Ambulatory Visit (INDEPENDENT_AMBULATORY_CARE_PROVIDER_SITE_OTHER): Payer: Managed Care, Other (non HMO) | Admitting: Internal Medicine

## 2021-11-26 ENCOUNTER — Encounter: Payer: Self-pay | Admitting: Internal Medicine

## 2021-11-26 VITALS — BP 134/79 | HR 75 | Temp 97.6°F | Resp 16 | Ht 68.0 in | Wt 169.6 lb

## 2021-11-26 DIAGNOSIS — J4520 Mild intermittent asthma, uncomplicated: Secondary | ICD-10-CM

## 2021-11-26 DIAGNOSIS — J301 Allergic rhinitis due to pollen: Secondary | ICD-10-CM | POA: Diagnosis not present

## 2021-11-26 NOTE — Progress Notes (Signed)
[facility] [place], [place] [address]  Pulmonary Sleep Medicine   Office Visit Note  Patient Name: [name] DOB: [birth year] MRN [id number]  Date of Service: 11/26/2021  Complaints/HPI: Asthma. She is well controlled . She has not had issues with any flareups. She is on allergy shots and we did discuss the possibility of coming off her shots.  She has been on the shots now for several years and she has done actually quite well.  She is potentially able to come off of the allergy shots at this time.  ROS  General: (-) fever, (-) chills, (-) night sweats, (-) weakness Skin: (-) rashes, (-) itching,. Eyes: (-) visual changes, (-) redness, (-) itching. Nose and Sinuses: (-) nasal stuffiness or itchiness, (-) postnasal drip, (-) nosebleeds, (-) sinus trouble. Mouth and Throat: (-) sore throat, (-) hoarseness. Neck: (-) swollen glands, (-) enlarged thyroid, (-) neck pain. Respiratory: - cough, (-) bloody sputum, - shortness of breath, - wheezing. Cardiovascular: - ankle swelling, (-) chest pain. Lymphatic: (-) lymph node enlargement. Neurologic: (-) numbness, (-) tingling. Psychiatric: (-) anxiety, (-) depression   Current Medication: Outpatient Encounter Medications as of 11/26/2021  Medication Sig   albuterol (VENTOLIN HFA) 108 (90 Base) MCG/ACT inhaler Inhale 1-2 puffs into the lungs every 4 (four) hours as needed for wheezing or shortness of breath.   buPROPion (WELLBUTRIN XL) 300 MG 24 hr tablet TAKE 1 TABLET BY MOUTH DAILY   cetirizine (ZYRTEC) 10 MG tablet Take 10 mg by mouth daily.   cyclobenzaprine (FLEXERIL) 10 MG tablet Take 1 tablet (10 mg total) by mouth 3 (three) times daily as needed for muscle spasms.   EPINEPHrine (EPIPEN 2-PAK) 0.3 mg/0.3 mL IJ SOAJ injection Inject 0.3 mLs (0.3 mg total) into the muscle as needed for anaphylaxis.   escitalopram (LEXAPRO) 20 MG tablet Take 1 tablet (20 mg total) by mouth daily.   estradiol (ESTRACE) 0.5  MG tablet Take 1 tablet (0.5 mg total) by mouth daily.   Insulin Syringe-Needle U-100 (INSULIN SYRINGE 1CC/31GX5/16") 31G X 5/16" 1 ML MISC USE FOR ALLERGIES TWO TIMES A WEEK   LORazepam (ATIVAN) 0.5 MG tablet Take 1 tablet (0.5 mg total) by mouth daily as needed for anxiety.   montelukast (SINGULAIR) 10 MG tablet TAKE 1 TABLET(10 MG) BY MOUTH DAILY   naproxen (NAPROSYN) 500 MG tablet TAKE 1 TABLET(500 MG) BY MOUTH TWICE DAILY WITH A MEAL   omeprazole (PRILOSEC) 40 MG capsule Take 1 capsule (40 mg total) by mouth in the morning and at bedtime. For 30 days.   roflumilast (DALIRESP) 500 MCG TABS tablet Take 1 tablet (500 mcg total) by mouth daily.   Semaglutide-Weight Management 2.4 MG/0.75ML SOAJ Inject 2.4 mg into the skin once a week.   triamcinolone ointment (KENALOG) 0.1 % APPLY TO THE AFFECTED AREA LEG, ARM, AND TRUNK TWICE DAILY. AVOID USE ON FACE   Vitamin D, Ergocalciferol, (DRISDOL) 1.25 MG (50000 UNIT) CAPS capsule Take 1 capsule (50,000 Units total) by mouth once a week.   zolpidem (AMBIEN) 10 MG tablet TAKE ONE TABLET BY MOUTH EVERY NIGHT AT BEDTIME AS NEEDED FOR SLEEP   No facility-administered encounter medications on file as of 11/26/2021.    Surgical History: Past Surgical History:  Procedure Laterality Date   APPENDECTOMY     ESOPHAGOGASTRODUODENOSCOPY (EGD) WITH PROPOFOL N/A 06/14/2019   Procedure: ESOPHAGOGASTRODUODENOSCOPY (EGD) WITH PROPOFOL;  Surgeon: Lucilla Lame, MD;  Location: [facility] ENDOSCOPY;  Service: Endoscopy;  Laterality: N/A;   OVARIAN  CYST DRAINAGE Right 12/25/2001   supracervial hysterectomy     TONSILLECTOMY     TUBAL LIGATION      Medical History: Past Medical History:  Diagnosis Date   Allergic rhinitis    Anxiety    Asthma    Constipation    COPD (chronic obstructive pulmonary disease) (HCC)    Depression    Hematuria    Insomnia    Reflux     Family History: Family History  Problem Relation Age of Onset   Cancer Mother        ovarian    Stroke Father    Cancer Maternal Uncle        lung   Diabetes Maternal Grandmother    Emphysema Maternal Grandfather    Diabetes Paternal Grandmother    Stroke Paternal Grandfather    Breast cancer Neg Hx     Social History: Social History   Socioeconomic History   Marital status: Married    Spouse name: Not on file   Number of children: Not on file   Years of education: Not on file   Highest education level: Not on file  Occupational History   Not on file  Tobacco Use   Smoking status: Never   Smokeless tobacco: Never  Vaping Use   Vaping Use: Never used  Substance and Sexual Activity   Alcohol use: Yes    Comment: occasional   Drug use: No   Sexual activity: Yes  Other Topics Concern   Not on file  Social History Narrative   Not on file   Social Determinants of Health   Financial Resource Strain: Not on file  Food Insecurity: Not on file  Transportation Needs: Not on file  Physical Activity: Not on file  Stress: Not on file  Social Connections: Not on file  Intimate Partner Violence: Not on file    Vital Signs: Blood pressure 134/79, pulse 75, temperature 97.6 F (36.4 C), resp. rate 16, height 5' 8"  (1.727 m), weight 169 lb 9.6 oz (76.9 kg), SpO2 98 %.  Examination: General Appearance: The patient is well-developed, well-nourished, and in no distress. Skin: Gross inspection of skin unremarkable. Head: normocephalic, no gross deformities. Eyes: no gross deformities noted. ENT: ears appear grossly normal no exudates. Neck: Supple. No thyromegaly. No LAD. Respiratory: no rhonchi noted. Cardiovascular: Normal S1 and S2 without murmur or rub. Extremities: No cyanosis. pulses are equal. Neurologic: Alert and oriented. No involuntary movements.  LABS: Recent Results (from the past 2160 hour(s))  Cytology - PAP     Status: None   Collection Time: 10/17/21  1:15 PM  Result Value Ref Range   High risk HPV Negative    Adequacy      Satisfactory for  evaluation; transformation zone component ABSENT.   Diagnosis      - Negative for intraepithelial lesion or malignancy (NILM)   Comment Normal Reference Range HPV - Negative   Urinalysis, Routine w reflex microscopic     Status: Abnormal   Collection Time: 10/17/21  1:39 PM  Result Value Ref Range   Specific Gravity, UA >1.030 (H) 1.005 - 1.030   pH, UA 5.5 5.0 - 7.5   Color, UA Yellow Yellow   Appearance Ur Cloudy (A) Clear   Leukocytes,UA Negative Negative   Protein,UA Negative Negative/Trace   Glucose, UA Negative Negative   Ketones, UA Negative Negative   RBC, UA 1+ (A) Negative   Bilirubin, UA Negative Negative   Urobilinogen, Ur 0.2 0.2 -  1.0 mg/dL   Nitrite, UA Negative Negative   Microscopic Examination See below:   Microscopic Examination     Status: Abnormal   Collection Time: 10/17/21  1:39 PM   Urine  Result Value Ref Range   WBC, UA 0-5 0 - 5 /hpf   RBC, Urine 0-2 0 - 2 /hpf   Epithelial Cells (non renal) 0-10 0 - 10 /hpf   Mucus, UA Present (A) Not Estab.   Bacteria, UA Many (A) None seen/Few  CBC with Differential/Platelet     Status: None   Collection Time: 10/17/21  1:42 PM  Result Value Ref Range   WBC 5.9 3.4 - 10.8 x10E3/uL   RBC 4.17 3.77 - 5.28 x10E6/uL   Hemoglobin 13.0 11.1 - 15.9 g/dL   Hematocrit 38.9 34.0 - 46.6 %   MCV 93 79 - 97 fL   MCH 31.2 26.6 - 33.0 pg   MCHC 33.4 31.5 - 35.7 g/dL   RDW 12.1 11.7 - 15.4 %   Platelets 278 150 - 450 x10E3/uL   Neutrophils 47 Not Estab. %   Lymphs 43 Not Estab. %   Monocytes 6 Not Estab. %   Eos 2 Not Estab. %   Basos 1 Not Estab. %   Neutrophils Absolute 2.8 1.4 - 7.0 x10E3/uL   Lymphocytes Absolute 2.6 0.7 - 3.1 x10E3/uL   Monocytes Absolute 0.4 0.1 - 0.9 x10E3/uL   EOS (ABSOLUTE) 0.1 0.0 - 0.4 x10E3/uL   Basophils Absolute 0.1 0.0 - 0.2 x10E3/uL   Immature Granulocytes 1 Not Estab. %   Immature Grans (Abs) 0.0 0.0 - 0.1 x10E3/uL  Comprehensive metabolic panel     Status: None   Collection Time:  10/17/21  1:42 PM  Result Value Ref Range   Glucose 92 70 - 99 mg/dL   BUN 11 6 - 24 mg/dL   Creatinine, Ser 0.68 0.57 - 1.00 mg/dL   eGFR 104 >59 mL/min/1.73   BUN/Creatinine Ratio 16 9 - 23   Sodium 141 134 - 144 mmol/L   Potassium 4.1 3.5 - 5.2 mmol/L   Chloride 101 96 - 106 mmol/L   CO2 24 20 - 29 mmol/L   Calcium 9.9 8.7 - 10.2 mg/dL   Total Protein 7.2 6.0 - 8.5 g/dL   Albumin 4.8 3.8 - 4.9 g/dL   Globulin, Total 2.4 1.5 - 4.5 g/dL   Albumin/Globulin Ratio 2.0 1.2 - 2.2   Bilirubin Total 0.3 0.0 - 1.2 mg/dL   Alkaline Phosphatase 67 44 - 121 IU/L   AST 14 0 - 40 IU/L   ALT 11 0 - 32 IU/L  Lipid Panel w/o Chol/HDL Ratio     Status: Abnormal   Collection Time: 10/17/21  1:42 PM  Result Value Ref Range   Cholesterol, Total 252 (H) 100 - 199 mg/dL   Triglycerides 227 (H) 0 - 149 mg/dL   HDL 75 >39 mg/dL   VLDL Cholesterol Cal 40 5 - 40 mg/dL   LDL Chol Calc (NIH) 137 (H) 0 - 99 mg/dL  TSH     Status: None   Collection Time: 10/17/21  1:42 PM  Result Value Ref Range   TSH 2.510 0.450 - 4.500 uIU/mL  B12     Status: Abnormal   Collection Time: 10/17/21  1:42 PM  Result Value Ref Range   Vitamin B-12 1,645 (H) 232 - 1,245 pg/mL  VITAMIN D 25 Hydroxy (Vit-D Deficiency, Fractures)     Status: None   Collection Time: 10/17/21  1:42 PM  Result Value Ref Range   Vit D, 25-Hydroxy 62.4 30.0 - 100.0 ng/mL    Comment: Vitamin D deficiency has been defined by the [place] practice guideline as a level of serum 25-OH vitamin D less than 20 ng/mL (1,2). The Endocrine Society went on to further define vitamin D insufficiency as a level between 21 and 29 ng/mL (2). 1. IOM (Institute of Medicine). 2010. Dietary reference    intakes for calcium and D. [place]: The    [employer]. 2. Holick MF, Binkley Todd, Bischoff-Ferrari HA, et al.    Evaluation, treatment, and prevention of vitamin D    deficiency: an Endocrine Society  clinical practice    guideline. JCEM. 2011 Jul; 96(7):1911-30.   Surgical pathology     Status: None   Collection Time: 10/22/21  9:53 AM  Result Value Ref Range   SURGICAL PATHOLOGY      SURGICAL PATHOLOGY CASE: [id number] PATIENT: Lorelle Solberg Surgical Pathology Report     Clinical History: dry nodule about 0.5 cm not going away and bleeding (cm)     FINAL MICROSCOPIC DIAGNOSIS:  A. SKIN, RIGHT ANKLE, EXCISION: - Squamous cell carcinoma, keratoacanthomatous type extending to the base of the biopsy.   GROSS DESCRIPTION: A. Received in formalin labeled with the patients name and DOB is an unoriented 1.2 x 1.2 cm circular, light tan skin shave. Centrally located on the skin's surface is a 0.8 x 0.7 cm indurated, scabrous lesion coming to within 0.1 cm of the peripheral margin. The specimen is inked black, sectioned, and entirely submitted in a single cassette. (LEF 10/23/2021)  Final Diagnosis performed by Claudette Laws, MD.   Electronically signed 10/24/2021 Technical and / or Professional components performed at [facility]. [facility], [place] [address], [place], [place] [address].  Immunohistochemistry Technical component (if appl icable) was performed at [facility]. [address] [place], [place], [place] [address].   IMMUNOHISTOCHEMISTRY DISCLAIMER (if applicable): Some of these immunohistochemical stains may have been developed and the performance characteristics determine by [facility]. Some may not have been cleared or approved by the U.S. Food and Drug Administration. The FDA has determined that such clearance or approval is not necessary. This test is used for clinical purposes. It should not be regarded as investigational or for research. This laboratory is certified under the [place] (CLIA-88) as qualified to perform high complexity clinical laboratory testing.  The  controls stained appropriately.     Radiology: No results found.  No results found.  No results found.    Assessment and Plan: Patient Active Problem List   Diagnosis Date Noted   Vitamin D deficiency 03/29/2021   Tinnitus of both ears 03/29/2021   Hot flashes 01/14/2021   Dysphagia    Stricture and stenosis of esophagus    Palpitations 08/12/2018   Hyperlipidemia, mixed 08/02/2018   Precordial pain 08/02/2018   Family history of ovarian cancer 03/12/2016   Vitamin B12 deficiency (non anemic) 11/18/2014   Constipation    Asthma    Allergic rhinitis    Depression    Anxiety    Hematuria    Insomnia    GERD without esophagitis     1. Seasonal allergic rhinitis due to pollen Continue with antihistamine medications and Singulair.  She will try to potentially come off of the allergy shots or at least decrease the frequency to see how she does  with her asthma  2. Chronic asthma, mild intermittent, uncomplicated Under good control plan is going to be to continue with supportive care as discussed we will try to get her off of the allergy medications.   General Counseling: I have discussed the findings of the evaluation and examination with Ninnie.  I have also discussed any further diagnostic evaluation thatmay be needed or ordered today. Chrisandra verbalizes understanding of the findings of todays visit. We also reviewed her medications today and discussed drug interactions and side effects including but not limited excessive drowsiness and altered mental states. We also discussed that there is always a risk not just to her but also people around her. she has been encouraged to call the office with any questions or concerns that should arise related to todays visit.  No orders of the defined types were placed in this encounter.    Time spent: 78  I have personally obtained a history, examined the patient, evaluated laboratory and imaging results, formulated the assessment and plan  and placed orders.    Allyne Gee, MD [facility] Pulmonary and Critical Care Sleep medicine

## 2021-11-26 NOTE — Patient Instructions (Signed)
Chronic Obstructive Pulmonary Disease  Chronic obstructive pulmonary disease (COPD) is a long-term (chronic) lung problem. When you have COPD, it is hard for air to get in and out of your lungs. Usually the condition gets worse over time, and your lungs will never return to normal. There are things you can do to keep yourself as healthy as possible. What are the causes? Smoking. This is the most common cause. Certain genes passed from parent to child (inherited). What increases the risk? Being exposed to secondhand smoke from cigarettes, pipes, or cigars. Being exposed to chemicals and other irritants, such as fumes and dust in the work environment. Having chronic lung conditions or infections. What are the signs or symptoms? Shortness of breath, especially during physical activity. A long-term cough with a large amount of thick mucus. Sometimes, the cough may not have any mucus (dry cough). Wheezing. Breathing quickly. Skin that looks gray or blue, especially in the fingers, toes, or lips. Feeling tired (fatigue). Weight loss. Chest tightness. Having infections often. Episodes when breathing symptoms become much worse (exacerbations). At the later stages of this disease, you may have swelling in the ankles, feet, or legs. How is this treated? Taking medicines. Quitting smoking, if you smoke. Rehabilitation. This includes steps to make your body work better. It may involve a team of specialists. Doing exercises. Making changes to your diet. Using oxygen. Lung surgery. Lung transplant. Comfort measures (palliative care). Follow these instructions at home: Medicines Take over-the-counter and prescription medicines only as told by your doctor. Talk to your doctor before taking any cough or allergy medicines. You may need to avoid medicines that cause your lungs to be dry. Lifestyle If you smoke, stop smoking. Smoking makes the problem worse. Do not smoke or use any products that  contain nicotine or tobacco. If you need help quitting, ask your doctor. Avoid being around things that make your breathing worse. This may include smoke, chemicals, and fumes. Stay active, but remember to rest as well. Learn and use tips on how to manage stress and control your breathing. Make sure you get enough sleep. Most adults need at least 7 hours of sleep every night. Eat healthy foods. Eat smaller meals more often. Rest before meals. Controlled breathing Learn and use tips on how to control your breathing as told by your doctor. Try: Breathing in (inhaling) through your nose for 1 second. Then, pucker your lips and breath out (exhale) through your lips for 2 seconds. Putting one hand on your belly (abdomen). Breathe in slowly through your nose for 1 second. Your hand on your belly should move out. Pucker your lips and breathe out slowly through your lips. Your hand on your belly should move in as you breathe out.  Controlled coughing Learn and use controlled coughing to clear mucus from your lungs. Follow these steps: Lean your head a little forward. Breathe in deeply. Try to hold your breath for 3 seconds. Keep your mouth slightly open while coughing 2 times. Spit any mucus out into a tissue. Rest and do the steps again 1 or 2 times as needed. General instructions Make sure you get all the shots (vaccines) that your doctor recommends. Ask your doctor about a flu shot and a pneumonia shot. Use oxygen therapy and pulmonary rehabilitation if told by your doctor. If you need home oxygen therapy, ask your doctor if you should buy a tool to measure your oxygen level (oximeter). Make a COPD action plan with your doctor. This helps you   to know what to do if you feel worse than usual. Manage any other conditions you have as told by your doctor. Avoid going outside when it is very hot, cold, or humid. Avoid people who have a sickness you can catch (contagious). Keep all follow-up  visits. Contact a doctor if: You cough up more mucus than usual. There is a change in the color or thickness of the mucus. It is harder to breathe than usual. Your breathing is faster than usual. You have trouble sleeping. You need to use your medicines more often than usual. You have trouble doing your normal activities such as getting dressed or walking around the house. Get help right away if: You have shortness of breath while resting. You have shortness of breath that stops you from: Being able to talk. Doing normal activities. Your chest hurts for longer than 5 minutes. Your skin color is more blue than usual. Your pulse oximeter shows that you have low oxygen for longer than 5 minutes. You have a fever. You feel too tired to breathe normally. These symptoms may represent a serious problem that is an emergency. Do not wait to see if the symptoms will go away. Get medical help right away. Call your local emergency services (911 in the [place] Do not drive yourself to the hospital. Summary Chronic obstructive pulmonary disease (COPD) is a long-term lung problem. The way your lungs work will never return to normal. Usually the condition gets worse over time. There are things you can do to keep yourself as healthy as possible. Take over-the-counter and prescription medicines only as told by your doctor. If you smoke, stop. Smoking makes the problem worse. This information is not intended to replace advice given to you by your health care provider. Make sure you discuss any questions you have with your health care provider. Document Revised: 12/20/2019 Document Reviewed: 12/20/2019 Elsevier Patient Education  2023 Elsevier Inc.  

## 2021-12-11 ENCOUNTER — Other Ambulatory Visit: Payer: Self-pay | Admitting: Internal Medicine

## 2021-12-11 DIAGNOSIS — G4733 Obstructive sleep apnea (adult) (pediatric): Secondary | ICD-10-CM

## 2021-12-12 NOTE — Telephone Encounter (Signed)
LAST 11/26/21 AND 10/24

## 2021-12-12 NOTE — Telephone Encounter (Signed)
Pt was seen 11/26/21 and next 10/24 please send pres

## 2022-01-28 ENCOUNTER — Telehealth: Payer: BC Managed Care – PPO | Admitting: Physician Assistant

## 2022-01-28 ENCOUNTER — Ambulatory Visit: Payer: Self-pay | Admitting: *Deleted

## 2022-01-28 DIAGNOSIS — B9689 Other specified bacterial agents as the cause of diseases classified elsewhere: Secondary | ICD-10-CM | POA: Diagnosis not present

## 2022-01-28 DIAGNOSIS — J208 Acute bronchitis due to other specified organisms: Secondary | ICD-10-CM | POA: Diagnosis not present

## 2022-01-28 MED ORDER — DOXYCYCLINE HYCLATE 100 MG PO TABS: 100.0000 mg | ORAL_TABLET | Freq: Two times a day (BID) | ORAL | 0 refills | Status: DC

## 2022-01-28 MED ORDER — BENZONATATE 100 MG PO CAPS: 100.0000 mg | ORAL_CAPSULE | Freq: Three times a day (TID) | ORAL | 0 refills | Status: DC | PRN

## 2022-01-28 NOTE — Telephone Encounter (Signed)
Reason for Disposition  [1] Continuous (nonstop) coughing interferes with work or school AND [2] no improvement using cough treatment per Care Advice  Answer Assessment - Initial Assessment Questions 1. ONSET: "When did the cough begin?"      Tried Robitussin and Mucinex and nothing is helping.   No fever now but did have.  My chest sore from coughing so much 2. SEVERITY: "How bad is the cough today?"      Coughing up dark green mucus.    Started last week.   I'm trying OTC med but not helping.    3. SPUTUM: "Describe the color of your sputum" (none, dry cough; clear, white, yellow, green)     Dark Green mucus 4. HEMOPTYSIS: "Are you coughing up any blood?" If so ask: "How much?" (flecks, streaks, tablespoons, etc.)     Not asked 5. DIFFICULTY BREATHING: "Are you having difficulty breathing?" If Yes, ask: "How bad is it?" (e.g., mild, moderate, severe)    - MILD: No SOB at rest, mild SOB with walking, speaks normally in sentences, can lie down, no retractions, pulse < 100.    - MODERATE: SOB at rest, SOB with minimal exertion and prefers to sit, cannot lie down flat, speaks in phrases, mild retractions, audible wheezing, pulse 100-120.    - SEVERE: Very SOB at rest, speaks in single words, struggling to breathe, sitting hunched forward, retractions, pulse > 120      Bad coughing 6. FEVER: "Do you have a fever?" If Yes, ask: "What is your temperature, how was it measured, and when did it start?"     Not now but I did in the beginning. 7. CARDIAC HISTORY: "Do you have any history of heart disease?" (e.g., heart attack, congestive heart failure)      Not asked 8. LUNG HISTORY: "Do you have any history of lung disease?"  (e.g., pulmonary embolus, asthma, emphysema)     I have asthma 9. PE RISK FACTORS: "Do you have a history of blood clots?" (or: recent major surgery, recent prolonged travel, bedridden)     Not asked 10. OTHER SYMPTOMS: "Do you have any other symptoms?" (e.g., runny nose,  wheezing, chest pain)       Sore throat, coughing up green mucus, fever, no runny noise. 11. PREGNANCY: "Is there any chance you are pregnant?" "When was your last menstrual period?"       Not asked  12. TRAVEL: "Have you traveled out of the country in the last month?" (e.g., travel history, exposures)       N/A  Protocols used: Cough - Acute Productive-A-AH

## 2022-01-28 NOTE — Progress Notes (Signed)
Virtual Visit Consent   [name], you are scheduled for a virtual visit with a [facility] provider today. Just as with appointments in the office, your consent must be obtained to participate. Your consent will be active for this visit and any virtual visit you may have with one of our providers in the next 365 days. If you have a MyChart account, a copy of this consent can be sent to you electronically.  As this is a virtual visit, video technology does not allow for your provider to perform a traditional examination. This may limit your provider's ability to fully assess your condition. If your provider identifies any concerns that need to be evaluated in person or the need to arrange testing (such as labs, EKG, etc.), we will make arrangements to do so. Although advances in technology are sophisticated, we cannot ensure that it will always work on either your end or our end. If the connection with a video visit is poor, the visit may have to be switched to a telephone visit. With either a video or telephone visit, we are not always able to ensure that we have a secure connection.  By engaging in this virtual visit, you consent to the provision of healthcare and authorize for your insurance to be billed (if applicable) for the services provided during this visit. Depending on your insurance coverage, you may receive a charge related to this service.  I need to obtain your verbal consent now. Are you willing to proceed with your visit today? [name] has provided verbal consent on 01/28/2022 for a virtual visit (video or telephone). Leeanne Rio, [place]  Date: 01/28/2022 9:44 AM  Virtual Visit via Video Note   I, Leeanne Rio, connected with  [name]  ([id number], 12/27/1970) on 01/28/22 at  9:45 AM EST by a video-enabled telemedicine application and verified that I am speaking with the correct person using two identifiers.  Location: Patient: Virtual Visit Location  Patient: Home Provider: Virtual Visit Location Provider: Home Office   I discussed the limitations of evaluation and management by telemedicine and the availability of in person appointments. The patient expressed understanding and agreed to proceed.    History of Present Illness: [name] is a 51 y.o. who identifies as a female who was assigned female at birth, and is being seen today for ongoing chest congestion and cough that has become productive of thick sputum. Denies fever, chills. Some chest wall tenderness from coughing and some chest tightness. Denies recent travel or sick contact. Negative COVID. Is taking OTC cough and congestion medications with only some relief in symptoms. Has history of asthma and using her inhaled medications as directed with relief.  HPI: HPI  Problems:  Patient Active Problem List   Diagnosis Date Noted   Vitamin D deficiency 03/29/2021   Tinnitus of both ears 03/29/2021   Hot flashes 01/14/2021   Dysphagia    Stricture and stenosis of esophagus    Palpitations 08/12/2018   Hyperlipidemia, mixed 08/02/2018   Precordial pain 08/02/2018   Family history of ovarian cancer 03/12/2016   Vitamin B12 deficiency (non anemic) 11/18/2014   Constipation    Asthma    Allergic rhinitis    Depression    Anxiety    Hematuria    Insomnia    GERD without esophagitis     Allergies:  Allergies  Allergen Reactions   Penicillins Anaphylaxis   Sulfa Antibiotics Other (See Comments)  Bad taste in mouth   Medications:  Current Outpatient Medications:    albuterol (VENTOLIN HFA) 108 (90 Base) MCG/ACT inhaler, Inhale 1-2 puffs into the lungs every 4 (four) hours as needed for wheezing or shortness of breath., Disp: 18 g, Rfl: 6   benzonatate (TESSALON) 100 MG capsule, Take 1 capsule (100 mg total) by mouth 3 (three) times daily as needed for cough., Disp: 30 capsule, Rfl: 0   buPROPion (WELLBUTRIN XL) 300 MG 24 hr tablet, TAKE 1 TABLET BY MOUTH DAILY, Disp:  90 tablet, Rfl: 1   cetirizine (ZYRTEC) 10 MG tablet, Take 10 mg by mouth daily., Disp: , Rfl:    cyclobenzaprine (FLEXERIL) 10 MG tablet, Take 1 tablet (10 mg total) by mouth 3 (three) times daily as needed for muscle spasms., Disp: 30 tablet, Rfl: 1   doxycycline (VIBRA-TABS) 100 MG tablet, Take 1 tablet (100 mg total) by mouth 2 (two) times daily., Disp: 14 tablet, Rfl: 0   EPINEPHrine (EPIPEN 2-PAK) 0.3 mg/0.3 mL IJ SOAJ injection, Inject 0.3 mLs (0.3 mg total) into the muscle as needed for anaphylaxis., Disp: 1 each, Rfl: 1   escitalopram (LEXAPRO) 20 MG tablet, Take 1 tablet (20 mg total) by mouth daily., Disp: 90 tablet, Rfl: 1   estradiol (ESTRACE) 0.5 MG tablet, Take 1 tablet (0.5 mg total) by mouth daily., Disp: 90 tablet, Rfl: 1   Insulin Syringe-Needle U-100 (INSULIN SYRINGE 1CC/31GX5/16") 31G X 5/16" 1 ML MISC, USE FOR ALLERGIES TWO TIMES A WEEK, Disp: 100 each, Rfl: 5   LORazepam (ATIVAN) 0.5 MG tablet, Take 1 tablet (0.5 mg total) by mouth daily as needed for anxiety., Disp: 30 tablet, Rfl: 0   montelukast (SINGULAIR) 10 MG tablet, TAKE 1 TABLET(10 MG) BY MOUTH DAILY, Disp: 90 tablet, Rfl: 1   naproxen (NAPROSYN) 500 MG tablet, TAKE 1 TABLET(500 MG) BY MOUTH TWICE DAILY WITH A MEAL, Disp: 180 tablet, Rfl: 1   omeprazole (PRILOSEC) 40 MG capsule, Take 1 capsule (40 mg total) by mouth in the morning and at bedtime. For 30 days., Disp: 180 capsule, Rfl: 1   roflumilast (DALIRESP) 500 MCG TABS tablet, Take 1 tablet (500 mcg total) by mouth daily., Disp: 90 tablet, Rfl: 3   Semaglutide-Weight Management 2.4 MG/0.75ML SOAJ, Inject 2.4 mg into the skin once a week., Disp: 9 mL, Rfl: 1   triamcinolone ointment (KENALOG) 0.1 %, APPLY TO THE AFFECTED AREA LEG, ARM, AND TRUNK TWICE DAILY. AVOID USE ON FACE, Disp: , Rfl:    Vitamin D, Ergocalciferol, (DRISDOL) 1.25 MG (50000 UNIT) CAPS capsule, Take 1 capsule (50,000 Units total) by mouth once a week., Disp: 12 capsule, Rfl: 4   zolpidem (AMBIEN)  10 MG tablet, TAKE ONE TABLET BY MOUTH EVERY NIGHT AT BEDTIME AS NEEDED FOR SLEEP, Disp: 30 tablet, Rfl: 3  Observations/Objective: Patient is well-developed, well-nourished in no acute distress.  Resting comfortably at home.  Head is normocephalic, atraumatic.  No labored breathing. Speech is clear and coherent with logical content.  Patient is alert and oriented at baseline.   Assessment and Plan: 1. Acute bacterial bronchitis - benzonatate (TESSALON) 100 MG capsule; Take 1 capsule (100 mg total) by mouth 3 (three) times daily as needed for cough.  Dispense: 30 capsule; Refill: 0 - doxycycline (VIBRA-TABS) 100 MG tablet; Take 1 tablet (100 mg total) by mouth 2 (two) times daily.  Dispense: 14 tablet; Refill: 0  Rx Doxycycline.  Increase fluids.  Rest.  Saline nasal spray.  Probiotic.  Mucinex as directed.  Humidifier in bedroom. Tessalon per orders.  Call or return to clinic if symptoms are not improving.   Follow Up Instructions: I discussed the assessment and treatment plan with the patient. The patient was provided an opportunity to ask questions and all were answered. The patient agreed with the plan and demonstrated an understanding of the instructions.  A copy of instructions were sent to the patient via MyChart unless otherwise noted below.    The patient was advised to call back or seek an in-person evaluation if the symptoms worsen or if the condition fails to improve as anticipated.  Time:  I spent 10 minutes with the patient via telehealth technology discussing the above problems/concerns.    Leeanne Rio, PA-C

## 2022-01-28 NOTE — Patient Instructions (Signed)
[name], thank you for joining Leeanne Rio, PA-C for today's virtual visit.  While this provider is not your primary care provider (PCP), if your PCP is located in our provider database this encounter information will be shared with them immediately following your visit.   [facility] account gives you access to today's visit and all your visits, tests, and labs performed at [facility] " click here if you don't have a [facility] account or go to [email or web address]  Consent: (Patient) [name] provided verbal consent for this virtual visit at the beginning of the encounter.  Current Medications:  Current Outpatient Medications:    albuterol (VENTOLIN HFA) 108 (90 Base) MCG/ACT inhaler, Inhale 1-2 puffs into the lungs every 4 (four) hours as needed for wheezing or shortness of breath., Disp: 18 g, Rfl: 6   buPROPion (WELLBUTRIN XL) 300 MG 24 hr tablet, TAKE 1 TABLET BY MOUTH DAILY, Disp: 90 tablet, Rfl: 1   cetirizine (ZYRTEC) 10 MG tablet, Take 10 mg by mouth daily., Disp: , Rfl:    cyclobenzaprine (FLEXERIL) 10 MG tablet, Take 1 tablet (10 mg total) by mouth 3 (three) times daily as needed for muscle spasms., Disp: 30 tablet, Rfl: 1   EPINEPHrine (EPIPEN 2-PAK) 0.3 mg/0.3 mL IJ SOAJ injection, Inject 0.3 mLs (0.3 mg total) into the muscle as needed for anaphylaxis., Disp: 1 each, Rfl: 1   escitalopram (LEXAPRO) 20 MG tablet, Take 1 tablet (20 mg total) by mouth daily., Disp: 90 tablet, Rfl: 1   estradiol (ESTRACE) 0.5 MG tablet, Take 1 tablet (0.5 mg total) by mouth daily., Disp: 90 tablet, Rfl: 1   Insulin Syringe-Needle U-100 (INSULIN SYRINGE 1CC/31GX5/16") 31G X 5/16" 1 ML MISC, USE FOR ALLERGIES TWO TIMES A WEEK, Disp: 100 each, Rfl: 5   LORazepam (ATIVAN) 0.5 MG tablet, Take 1 tablet (0.5 mg total) by mouth daily as needed for anxiety., Disp: 30 tablet, Rfl: 0   montelukast (SINGULAIR) 10 MG tablet, TAKE 1 TABLET(10 MG) BY MOUTH  DAILY, Disp: 90 tablet, Rfl: 1   naproxen (NAPROSYN) 500 MG tablet, TAKE 1 TABLET(500 MG) BY MOUTH TWICE DAILY WITH A MEAL, Disp: 180 tablet, Rfl: 1   omeprazole (PRILOSEC) 40 MG capsule, Take 1 capsule (40 mg total) by mouth in the morning and at bedtime. For 30 days., Disp: 180 capsule, Rfl: 1   roflumilast (DALIRESP) 500 MCG TABS tablet, Take 1 tablet (500 mcg total) by mouth daily., Disp: 90 tablet, Rfl: 3   Semaglutide-Weight Management 2.4 MG/0.75ML SOAJ, Inject 2.4 mg into the skin once a week., Disp: 9 mL, Rfl: 1   triamcinolone ointment (KENALOG) 0.1 %, APPLY TO THE AFFECTED AREA LEG, ARM, AND TRUNK TWICE DAILY. AVOID USE ON FACE, Disp: , Rfl:    Vitamin D, Ergocalciferol, (DRISDOL) 1.25 MG (50000 UNIT) CAPS capsule, Take 1 capsule (50,000 Units total) by mouth once a week., Disp: 12 capsule, Rfl: 4   zolpidem (AMBIEN) 10 MG tablet, TAKE ONE TABLET BY MOUTH EVERY NIGHT AT BEDTIME AS NEEDED FOR SLEEP, Disp: 30 tablet, Rfl: 3   Medications ordered in this encounter:  No orders of the defined types were placed in this encounter.    *If you need refills on other medications prior to your next appointment, please contact your pharmacy*  Follow-Up: Call back or seek an in-person evaluation if the symptoms worsen or if the condition fails to improve as anticipated.  [facility] [phone]  Other Instructions  Take antibiotic (Doxycycline) as directed.  Increase fluids.  Get plenty of rest. Use Mucinex for congestion. Tessalon for cough. Continue your asthma medications as directed. Take a daily probiotic (I recommend Align or Culturelle, but even Activia Yogurt may be beneficial).  A humidifier placed in the bedroom may offer some relief for a dry, scratchy throat of nasal irritation.  Read information below on acute bronchitis. Please call or return to clinic if symptoms are not improving.  Acute Bronchitis Bronchitis is when the airways that extend from the windpipe into  the lungs get red, puffy, and painful (inflamed). Bronchitis often causes thick spit (mucus) to develop. This leads to a cough. A cough is the most common symptom of bronchitis. In acute bronchitis, the condition usually begins suddenly and goes away over time (usually in 2 weeks). Smoking, allergies, and asthma can make bronchitis worse. Repeated episodes of bronchitis may cause more lung problems.  HOME CARE Rest. Drink enough fluids to keep your pee (urine) clear or pale yellow (unless you need to limit fluids as told by your doctor). Only take over-the-counter or prescription medicines as told by your doctor. Avoid smoking and secondhand smoke. These can make bronchitis worse. If you are a smoker, think about using nicotine gum or skin patches. Quitting smoking will help your lungs heal faster. Reduce the chance of getting bronchitis again by: Washing your hands often. Avoiding people with cold symptoms. Trying not to touch your hands to your mouth, nose, or eyes. Follow up with your doctor as told.  GET HELP IF: Your symptoms do not improve after 1 week of treatment. Symptoms include: Cough. Fever. Coughing up thick spit. Body aches. Chest congestion. Chills. Shortness of breath. Sore throat.  GET HELP RIGHT AWAY IF:  You have an increased fever. You have chills. You have severe shortness of breath. You have bloody thick spit (sputum). You throw up (vomit) often. You lose too much body fluid (dehydration). You have a severe headache. You faint.  MAKE SURE YOU:  Understand these instructions. Will watch your condition. Will get help right away if you are not doing well or get worse. Document Released: 07/30/2007 Document Revised: 10/13/2012 Document Reviewed: 08/03/2012 [facility] Patient Information 2015 [place], [place]. This information is not intended to replace advice given to you by your health care provider. Make sure you discuss any questions you have with your health  care provider.    If you have been instructed to have an in-person evaluation today at a local Urgent Care facility, please use the link below. It will take you to a list of all of our available [facility], including address, phone number and hours of operation. Please do not delay care.  [facility]  If you or a family member do not have a primary care provider, use the link below to schedule a visit and establish care. When you choose a [facility] primary care physician or advanced practice provider, you gain a long-term partner in health. Find a Primary Care Provider  Learn more about 's in-office and virtual care options: [facility]

## 2022-01-28 NOTE — Telephone Encounter (Signed)
  Chief Complaint: coughing up green mucus, irritated throat, sore chest from coughing so much.  OTC meds not helping  Symptoms: A lot of coughing mostly Frequency: For the past week. Pertinent Negatives: Patient denies fever now but had fever the first few days, no runny nose. Disposition: '[]'$ ED /'[]'$ Urgent Care (no appt availability in office) / '[]'$ Appointment(In office/virtual)/ '[x]'$  [facility]/ '[]'$ Home Care/ '[]'$ Refused Recommended Disposition /'[]'$ [facility]/ '[]'$  Follow-up with PCP Additional Notes: No appts. Available at [facility] today.   Offered her a MyChart Urgent virtual visit option.   She was agreeable to doing this.   I offered to assist her with getting scheduled however she felt ok with doing it herself.

## 2022-03-17 ENCOUNTER — Other Ambulatory Visit: Payer: Self-pay | Admitting: Family Medicine

## 2022-03-18 NOTE — Telephone Encounter (Signed)
Requested Prescriptions  Pending Prescriptions Disp Refills   estradiol (ESTRACE) 0.5 MG tablet [Pharmacy Med Name: ESTRADIOL 0.5 MG TABLET] 90 tablet 2    Sig: TAKE 1 TABLET BY MOUTH DAILY     OB/GYN:  Estrogens Passed - 03/17/2022  3:39 PM      Passed - Mammogram is up-to-date per Health Maintenance      Passed - Last BP in normal range    BP Readings from Last 1 Encounters:  11/26/21 134/79         Passed - Valid encounter within last 12 months    Recent Outpatient Visits           4 months ago Neoplasm of uncertain behavior of skin   [place] P, DO   5 months ago Routine general medical examination at a health care facility   [facility], [name], DO   11 months ago Hyperlipidemia, mixed   [place], [place], DO   1 year ago Hot flashes   [place], [place], DO   1 year ago [place], DO       Future Appointments             In 1 month Johnson, Barb Merino, DO [facility]

## 2022-04-14 ENCOUNTER — Telehealth: Payer: Self-pay

## 2022-04-17 ENCOUNTER — Other Ambulatory Visit: Payer: Self-pay

## 2022-04-17 ENCOUNTER — Telehealth: Payer: Self-pay

## 2022-04-17 DIAGNOSIS — G4733 Obstructive sleep apnea (adult) (pediatric): Secondary | ICD-10-CM

## 2022-04-17 MED ORDER — ZOLPIDEM TARTRATE 10 MG PO TABS: ORAL_TABLET | ORAL | 1 refills | Status: DC

## 2022-04-17 NOTE — Telephone Encounter (Signed)
Lmom for pt that I called zolpidem 10 mg that we called in her pres to [facility]

## 2022-04-17 NOTE — Telephone Encounter (Signed)
Ok by [place] to called in 90 days with 1 refills

## 2022-04-17 NOTE — Telephone Encounter (Signed)
Called in phar as per [facility] zolpidem 10 mg 1 tab po daily prn 90 with 1 refills

## 2022-04-21 ENCOUNTER — Ambulatory Visit (INDEPENDENT_AMBULATORY_CARE_PROVIDER_SITE_OTHER): Payer: Managed Care, Other (non HMO) | Admitting: Family Medicine

## 2022-04-21 ENCOUNTER — Encounter: Payer: Self-pay | Admitting: Family Medicine

## 2022-04-21 VITALS — BP 108/71 | HR 71 | Temp 98.7°F | Ht 68.0 in | Wt 161.5 lb

## 2022-04-21 DIAGNOSIS — Z8041 Family history of malignant neoplasm of ovary: Secondary | ICD-10-CM

## 2022-04-21 DIAGNOSIS — E782 Mixed hyperlipidemia: Secondary | ICD-10-CM

## 2022-04-21 DIAGNOSIS — E538 Deficiency of other specified B group vitamins: Secondary | ICD-10-CM

## 2022-04-21 DIAGNOSIS — F419 Anxiety disorder, unspecified: Secondary | ICD-10-CM | POA: Diagnosis not present

## 2022-04-21 DIAGNOSIS — E559 Vitamin D deficiency, unspecified: Secondary | ICD-10-CM

## 2022-04-21 DIAGNOSIS — Z1231 Encounter for screening mammogram for malignant neoplasm of breast: Secondary | ICD-10-CM

## 2022-04-21 DIAGNOSIS — R232 Flushing: Secondary | ICD-10-CM | POA: Diagnosis not present

## 2022-04-21 DIAGNOSIS — F330 Major depressive disorder, recurrent, mild: Secondary | ICD-10-CM | POA: Diagnosis not present

## 2022-04-21 DIAGNOSIS — Z8639 Personal history of other endocrine, nutritional and metabolic disease: Secondary | ICD-10-CM | POA: Insufficient documentation

## 2022-04-21 MED ORDER — BUPROPION HCL ER (XL) 300 MG PO TB24: ORAL_TABLET | ORAL | 1 refills | Status: DC

## 2022-04-21 MED ORDER — ALBUTEROL SULFATE HFA 108 (90 BASE) MCG/ACT IN AERS: 1.0000 | INHALATION_SPRAY | RESPIRATORY_TRACT | 6 refills | Status: DC | PRN

## 2022-04-21 MED ORDER — SEMAGLUTIDE-WEIGHT MANAGEMENT 2.4 MG/0.75ML ~~LOC~~ SOAJ: 2.4000 mg | SUBCUTANEOUS | 1 refills | Status: DC

## 2022-04-21 MED ORDER — OMEPRAZOLE 40 MG PO CPDR: 40.0000 mg | DELAYED_RELEASE_CAPSULE | Freq: Two times a day (BID) | ORAL | 1 refills | Status: DC

## 2022-04-21 MED ORDER — NAPROXEN 500 MG PO TABS: ORAL_TABLET | ORAL | 1 refills | Status: DC

## 2022-04-21 MED ORDER — ESCITALOPRAM OXALATE 20 MG PO TABS: 20.0000 mg | ORAL_TABLET | Freq: Every day | ORAL | 1 refills | Status: DC

## 2022-04-21 MED ORDER — MONTELUKAST SODIUM 10 MG PO TABS: ORAL_TABLET | ORAL | 1 refills | Status: DC

## 2022-04-21 NOTE — Assessment & Plan Note (Signed)
Rechecking labs today. Await results. Treat as needed.  °

## 2022-04-21 NOTE — Assessment & Plan Note (Signed)
Has been doing great. Tolerating wegovy well. Down 31lbs! Continue wegovy. Continue diet and exercise. Call with any concerns.

## 2022-04-21 NOTE — Assessment & Plan Note (Signed)
Under good control on current regimen. Continue current regimen. Continue to monitor. Call with any concerns. Refills given.   

## 2022-04-21 NOTE — Patient Instructions (Signed)
Please call to schedule your mammogram and/or bone density: (Your last mammo was 05/30/21). [facility]  Address: [address], [place], [place] [address] Phone: [phone]  [facility] [address] [place],  [place]  [address] Phone: [phone]

## 2022-04-21 NOTE — Assessment & Plan Note (Signed)
Due for recheck on CA125- ordered today. Await results.

## 2022-04-21 NOTE — Progress Notes (Signed)
BP 108/71   Pulse 71   Temp 98.7 F (37.1 C) (Oral)   Ht '5\' 8"'$  (1.727 m)   Wt 161 lb 8 oz (73.3 kg)   SpO2 99%   BMI 24.56 kg/m    Subjective:    Patient ID: [name], female    DOB: Jan 30, 1971, 52 y.o.   MRN: [id number]  HPI: [name] is a 52 y.o. female  Chief Complaint  Patient presents with   Anxiety   Depression   Obesity   OBESITY Duration: chronic Previous attempts at weight loss: yes Complications of obesity: depression, GERD Peak weight: 192 Weight loss goal: to maintain  Weight loss to date: 31lbs Requesting obesity pharmacotherapy: yes Current weight loss supplements/medications: yes Previous weight loss supplements/meds: yes  ANXIETY/DEPRESSION Duration: chronic Status:controlled Anxious mood: no  Excessive worrying: no Irritability: no  Sweating: no Nausea: no Palpitations:no Hyperventilation: no Panic attacks: no Agoraphobia: no  Obscessions/compulsions: no Depressed mood: no    04/21/2022    1:29 PM 10/17/2021    1:23 PM 03/29/2021   10:09 AM 01/14/2021    4:18 PM 12/17/2020   11:13 AM  Depression screen PHQ 2/9  Decreased Interest 0 0 0 0 0  Down, Depressed, Hopeless 0 0 0 0 0  PHQ - 2 Score 0 0 0 0 0  Altered sleeping 1 0 1 0 1  Tired, decreased energy 1 0 1 0 1  Change in appetite 0 0 0 0 0  Feeling bad or failure about yourself  0 0 0 0 0  Trouble concentrating 0 0 1 0 0  Moving slowly or fidgety/restless 0 0 0 0 0  Suicidal thoughts 0 0 0 0 0  PHQ-9 Score 2 0 3 0 2  Difficult doing work/chores Not difficult at all Not difficult at all Somewhat difficult Not difficult at all Not difficult at all   Anhedonia: no Weight changes: no Insomnia: no   Hypersomnia: no Fatigue/loss of energy: no Feelings of worthlessness: no Feelings of guilt: no Impaired concentration/indecisiveness: no Suicidal ideations: no  Crying spells: no Recent Stressors/Life Changes: no   Relationship problems: no   Family stress: no      Financial stress: no    Job stress: no    Recent death/loss: no    Relevant past medical, surgical, family and social history reviewed and updated as indicated. Interim medical history since our last visit reviewed. Allergies and medications reviewed and updated.  Review of Systems  Constitutional: Negative.   Respiratory: Negative.    Cardiovascular: Negative.   Gastrointestinal: Negative.   Musculoskeletal: Negative.   Neurological: Negative.   Psychiatric/Behavioral: Negative.      Per HPI unless specifically indicated above     Objective:    BP 108/71   Pulse 71   Temp 98.7 F (37.1 C) (Oral)   Ht '5\' 8"'$  (1.727 m)   Wt 161 lb 8 oz (73.3 kg)   SpO2 99%   BMI 24.56 kg/m   Wt Readings from Last 3 Encounters:  04/21/22 161 lb 8 oz (73.3 kg)  11/26/21 169 lb 9.6 oz (76.9 kg)  10/22/21 173 lb 11.2 oz (78.8 kg)    Physical Exam Vitals and nursing note reviewed.  Constitutional:      General: She is not in acute distress.    Appearance: Normal appearance. She is normal weight. She is not ill-appearing, toxic-appearing or diaphoretic.  HENT:     Head: Normocephalic and atraumatic.  Right Ear: External ear normal.     Left Ear: External ear normal.     Nose: Nose normal.     Mouth/Throat:     Mouth: Mucous membranes are moist.     Pharynx: Oropharynx is clear.  Eyes:     General: No scleral icterus.       Right eye: No discharge.        Left eye: No discharge.     Extraocular Movements: Extraocular movements intact.     Conjunctiva/sclera: Conjunctivae normal.     Pupils: Pupils are equal, round, and reactive to light.  Cardiovascular:     Rate and Rhythm: Normal rate and regular rhythm.     Pulses: Normal pulses.     Heart sounds: Normal heart sounds. No murmur heard.    No friction rub. No gallop.  Pulmonary:     Effort: Pulmonary effort is normal. No respiratory distress.     Breath sounds: Normal breath sounds. No stridor. No wheezing, rhonchi or  rales.  Chest:     Chest wall: No tenderness.  Musculoskeletal:        General: Normal range of motion.     Cervical back: Normal range of motion and neck supple.  Skin:    General: Skin is warm and dry.     Capillary Refill: Capillary refill takes less than 2 seconds.     Coloration: Skin is not jaundiced or pale.     Findings: No bruising, erythema, lesion or rash.  Neurological:     General: No focal deficit present.     Mental Status: She is alert and oriented to person, place, and time. Mental status is at baseline.  Psychiatric:        Mood and Affect: Mood normal.        Behavior: Behavior normal.        Thought Content: Thought content normal.        Judgment: Judgment normal.     Results for orders placed or performed in visit on 10/22/21  Surgical pathology  Result Value Ref Range   SURGICAL PATHOLOGY      SURGICAL PATHOLOGY CASE: [id number] PATIENT: [name] Surgical Pathology Report     Clinical History: dry nodule about 0.5 cm not going away and bleeding (cm)     FINAL MICROSCOPIC DIAGNOSIS:  A. SKIN, RIGHT ANKLE, EXCISION: - Squamous cell carcinoma, keratoacanthomatous type extending to the base of the biopsy.   GROSS DESCRIPTION: A. Received in formalin labeled with the patients name and DOB is an unoriented 1.2 x 1.2 cm circular, light tan skin shave. Centrally located on the skin's surface is a 0.8 x 0.7 cm indurated, scabrous lesion coming to within 0.1 cm of the peripheral margin. The specimen is inked black, sectioned, and entirely submitted in a single cassette. (LEF 10/23/2021)  Final Diagnosis performed by Claudette Laws, MD.   Electronically signed 10/24/2021 Technical and / or Professional components performed at [facility]. [facility], [place] [address], [place], [place] [address].  Immunohistochemistry Technical component (if appl icable) was performed at [facility]. [address], [place], [place], [place] [address].   IMMUNOHISTOCHEMISTRY DISCLAIMER (if applicable): Some of these immunohistochemical stains may have been developed and the performance characteristics determine by [facility]. Some may not have been cleared or approved by the U.S. Food and Drug Administration. The FDA has determined that such clearance or approval is not necessary. This test is used for clinical purposes.  It should not be regarded as investigational or for research. This laboratory is certified under the [place] (CLIA-88) as qualified to perform high complexity clinical laboratory testing.  The controls stained appropriately.       Assessment & Plan:   Problem List Items Addressed This Visit       Cardiovascular and Mediastinum   Hot flashes - Primary    Under good control on current regimen. Continue current regimen. Continue to monitor. Call with any concerns. Refills given.          Other   Depression    Under good control on current regimen. Continue current regimen. Continue to monitor. Call with any concerns. Refills given.        Relevant Medications   buPROPion (WELLBUTRIN XL) 300 MG 24 hr tablet   escitalopram (LEXAPRO) 20 MG tablet   Anxiety    Under good control on current regimen. Continue current regimen. Continue to monitor. Call with any concerns. Refills given.        Relevant Medications   buPROPion (WELLBUTRIN XL) 300 MG 24 hr tablet   escitalopram (LEXAPRO) 20 MG tablet   Vitamin B12 deficiency (non anemic)    Rechecking labs today. Await results. Treat as needed.      Relevant Orders   B12   Family history of ovarian cancer    Due for recheck on CA125- ordered today. Await results.       Relevant Orders   CA 125   Hyperlipidemia, mixed    Rechecking labs today. Await results. Treat as needed.      Relevant Orders   Comprehensive metabolic panel   Lipid Panel w/o Chol/HDL Ratio    Vitamin D deficiency    Rechecking labs today. Await results. Treat as needed.      Relevant Orders   VITAMIN D 25 Hydroxy (Vit-D Deficiency, Fractures)   History of obesity    Has been doing great. Tolerating wegovy well. Down 31lbs! Continue wegovy. Continue diet and exercise. Call with any concerns.       Other Visit Diagnoses     Encounter for screening mammogram for malignant neoplasm of breast       Mammogram ordered today.   Relevant Orders   MM 3D SCREEN BREAST BILATERAL        Follow up plan: Return in about 6 months (around 10/20/2022) for physical.

## 2022-04-22 LAB — COMPREHENSIVE METABOLIC PANEL
ALT: 10 IU/L (ref 0–32)
AST: 12 IU/L (ref 0–40)
Albumin/Globulin Ratio: 1.8 (ref 1.2–2.2)
Albumin: 4.5 g/dL (ref 3.8–4.9)
Alkaline Phosphatase: 65 IU/L (ref 44–121)
BUN/Creatinine Ratio: 18 (ref 9–23)
BUN: 15 mg/dL (ref 6–24)
Bilirubin Total: <0.2 mg/dL (ref 0.0–1.2)
CO2: 24 mmol/L (ref 20–29)
Calcium: 9.8 mg/dL (ref 8.7–10.2)
Chloride: 105 mmol/L (ref 96–106)
Creatinine, Ser: 0.85 mg/dL (ref 0.57–1.00)
Globulin, Total: 2.5 g/dL (ref 1.5–4.5)
Glucose: 90 mg/dL (ref 70–99)
Potassium: 4.4 mmol/L (ref 3.5–5.2)
Sodium: 144 mmol/L (ref 134–144)
Total Protein: 7 g/dL (ref 6.0–8.5)
eGFR: 82 mL/min/{1.73_m2} (ref >59)

## 2022-04-22 LAB — LIPID PANEL W/O CHOL/HDL RATIO
Cholesterol, Total: 234 mg/dL — ABNORMAL HIGH (ref 100–199)
HDL: 77 mg/dL (ref >39)
LDL Chol Calc (NIH): 102 mg/dL — ABNORMAL HIGH (ref 0–99)
Triglycerides: 331 mg/dL — ABNORMAL HIGH (ref 0–149)
VLDL Cholesterol Cal: 55 mg/dL — ABNORMAL HIGH (ref 5–40)

## 2022-04-22 LAB — CA 125: Cancer Antigen (CA) 125: 14.2 U/mL (ref 0.0–38.1)

## 2022-04-22 LAB — VITAMIN D 25 HYDROXY (VIT D DEFICIENCY, FRACTURES): Vit D, 25-Hydroxy: 61.1 ng/mL (ref 30.0–100.0)

## 2022-04-22 LAB — VITAMIN B12: Vitamin B-12: 670 pg/mL (ref 232–1245)

## 2022-04-28 ENCOUNTER — Ambulatory Visit: Payer: Managed Care, Other (non HMO) | Admitting: Internal Medicine

## 2022-04-30 ENCOUNTER — Encounter: Payer: Self-pay | Admitting: Family Medicine

## 2022-05-06 ENCOUNTER — Telehealth: Payer: Self-pay

## 2022-05-06 NOTE — Telephone Encounter (Signed)
Prior authorization was initiated via [employer] for [facility] 2.4 MG/0.75. Patient was approved for prescription. A paper copy will be faxed out to our office.

## 2022-05-23 ENCOUNTER — Other Ambulatory Visit: Payer: Self-pay | Admitting: Family Medicine

## 2022-05-23 NOTE — Telephone Encounter (Signed)
Requested medication (s) are due for refill today: yes  Requested medication (s) are on the active medication list: yes  Last refill:  03/29/21  Future visit scheduled: yes  Notes to clinic:  Manual Review: Route requests for 50,000 IU strength to the provider      Requested Prescriptions  Pending Prescriptions Disp Refills   Vitamin D, Ergocalciferol, (DRISDOL) 1.25 MG (50000 UNIT) CAPS capsule [Pharmacy Med Name: VITAMIN D2 1.25MG (50,000 UNIT)] 12 capsule 2    Sig: TAKE 1 CAPSULE BY MOUTH ONCE A WEEK     Endocrinology:  Vitamins - Vitamin D Supplementation 2 Failed - 05/23/2022  9:55 AM      Failed - Manual Review: Route requests for 50,000 IU strength to the provider      Passed - Ca in normal range and within 360 days    Calcium  Date Value Ref Range Status  04/21/2022 9.8 8.7 - 10.2 mg/dL Final   Calcium, Total  Date Value Ref Range Status  03/04/2012 9.5 8.5 - 10.1 mg/dL Final         Passed - Vitamin D in normal range and within 360 days    Vit D, 25-Hydroxy  Date Value Ref Range Status  04/21/2022 61.1 30.0 - 100.0 ng/mL Final    Comment:    Vitamin D deficiency has been defined by the Institute of Medicine and an Endocrine Society practice guideline as a level of serum 25-OH vitamin D less than 20 ng/mL (1,2). The Endocrine Society went on to further define vitamin D insufficiency as a level between 21 and 29 ng/mL (2). 1. IOM (Institute of Medicine). 2010. Dietary reference    intakes for calcium and D. [place]: The    [employer]. 2. Holick MF, Binkley [place], Bischoff-Ferrari HA, et al.    Evaluation, treatment, and prevention of vitamin D    deficiency: an Endocrine Society clinical practice    guideline. JCEM. 2011 Jul; 96(7):1911-30.          Passed - Valid encounter within last 12 months    Recent Outpatient Visits           1 month ago Hot flashes   [place] P, DO   7 months ago Neoplasm  of uncertain behavior of skin   [place], DO   7 months ago Routine general medical examination at a health care facility   [place], [place], DO   1 year ago Hyperlipidemia, mixed   [employer] [place], [place], DO   1 year ago Hot flashes   [place], DO       Future Appointments             In 5 months Wynetta Emery, Barb Merino, DO [place], [facility]

## 2022-05-31 ENCOUNTER — Other Ambulatory Visit: Payer: Self-pay | Admitting: Family Medicine

## 2022-06-02 NOTE — Telephone Encounter (Signed)
Unable to refill per protocol, Rx request is too soon. Last refill 04/21/22 for 90 and 1 refill.  Requested Prescriptions  Pending Prescriptions Disp Refills   buPROPion (WELLBUTRIN XL) 300 MG 24 hr tablet [Pharmacy Med Name: BUPROPION HCL XL 300 MG TABLET] 90 tablet 1    Sig: TAKE 1 TABLET BY MOUTH EVERY DAY     Psychiatry: Antidepressants - bupropion Passed - 05/31/2022  1:10 AM      Passed - Cr in normal range and within 360 days    Creatinine  Date Value Ref Range Status  03/04/2012 0.73 0.60 - 1.30 mg/dL Final   Creatinine, Ser  Date Value Ref Range Status  04/21/2022 0.85 0.57 - 1.00 mg/dL Final         Passed - AST in normal range and within 360 days    AST  Date Value Ref Range Status  04/21/2022 12 0 - 40 IU/L Final         Passed - ALT in normal range and within 360 days    ALT  Date Value Ref Range Status  04/21/2022 10 0 - 32 IU/L Final         Passed - Completed PHQ-2 or PHQ-9 in the last 360 days      Passed - Last BP in normal range    BP Readings from Last 1 Encounters:  04/21/22 108/71         Passed - Valid encounter within last 6 months    Recent Outpatient Visits           1 month ago Hot flashes   [place] [facility] [place], [name], DO   7 months ago Neoplasm of uncertain behavior of skin   [place] [facility] [place], [name], DO   7 months ago Routine general medical examination at a health care facility   [facility] [place], [name], DO   1 year ago Hyperlipidemia, mixed   [place] [facility] [place], [place], DO   1 year ago Hot flashes   [place] [facility] [place], [place], DO       Future Appointments             In 4 months Laural Benes, [name], DO [place] [facility], [facility]

## 2022-06-30 ENCOUNTER — Encounter: Payer: Self-pay | Admitting: Family Medicine

## 2022-06-30 ENCOUNTER — Ambulatory Visit (INDEPENDENT_AMBULATORY_CARE_PROVIDER_SITE_OTHER): Payer: Managed Care, Other (non HMO) | Admitting: Family Medicine

## 2022-06-30 VITALS — BP 103/67 | HR 62 | Temp 98.4°F | Ht 68.0 in | Wt 162.9 lb

## 2022-06-30 DIAGNOSIS — B029 Zoster without complications: Secondary | ICD-10-CM

## 2022-06-30 MED ORDER — VALACYCLOVIR HCL 1 G PO TABS: 1000.0000 mg | ORAL_TABLET | Freq: Three times a day (TID) | ORAL | 0 refills | Status: AC

## 2022-06-30 MED ORDER — CYCLOBENZAPRINE HCL 10 MG PO TABS: 10.0000 mg | ORAL_TABLET | Freq: Three times a day (TID) | ORAL | 1 refills | Status: DC | PRN

## 2022-06-30 MED ORDER — VALACYCLOVIR HCL 1 G PO TABS: 1000.0000 mg | ORAL_TABLET | Freq: Two times a day (BID) | ORAL | 0 refills | Status: DC

## 2022-06-30 NOTE — Progress Notes (Signed)
BP 103/67   Pulse 62   Temp 98.4 F (36.9 C) (Oral)   Ht 5\' 8"  (1.727 m)   Wt 162 lb 14.4 oz (73.9 kg)   SpO2 97%   BMI 24.77 kg/m    Subjective:    Patient ID: [name], female    DOB: 1970/05/29, 52 y.o.   MRN: [id number]  HPI: [name] is a 52 y.o. female  Chief Complaint  Patient presents with   Herpes Zoster    Patient says she first noticed some bumps and spots on her R side abdomen and moved around to her back. Patient says they are burning and itching in those areas. Patient declines trying any over the counter medication.    RASH Duration:   1 day   Location: trunk  Itching: no Burning: yes Redness: yes Oozing: no Scaling: yes Blisters: yes Painful: yes Fevers: no Change in detergents/soaps/personal care products: no Recent illness: no Recent travel:no History of same: no Context: worse Alleviating factors: nothing Treatments attempted:nothing Shortness of breath: no  Throat/tongue swelling: no Myalgias/arthralgias: no   Relevant past medical, surgical, family and social history reviewed and updated as indicated. Interim medical history since our last visit reviewed. Allergies and medications reviewed and updated.  Review of Systems  Constitutional: Negative.   Respiratory: Negative.    Cardiovascular: Negative.   Gastrointestinal: Negative.   Musculoskeletal: Negative.   Psychiatric/Behavioral: Negative.      Per HPI unless specifically indicated above     Objective:    BP 103/67   Pulse 62   Temp 98.4 F (36.9 C) (Oral)   Ht 5\' 8"  (1.727 m)   Wt 162 lb 14.4 oz (73.9 kg)   SpO2 97%   BMI 24.77 kg/m   Wt Readings from Last 3 Encounters:  06/30/22 162 lb 14.4 oz (73.9 kg)  04/21/22 161 lb 8 oz (73.3 kg)  11/26/21 169 lb 9.6 oz (76.9 kg)    Physical Exam Vitals and nursing note reviewed.  Constitutional:      General: She is not in acute distress.    Appearance: Normal appearance. She is not ill-appearing,  toxic-appearing or diaphoretic.  HENT:     Head: Normocephalic and atraumatic.     Right Ear: External ear normal.     Left Ear: External ear normal.     Nose: Nose normal.     Mouth/Throat:     Mouth: Mucous membranes are moist.     Pharynx: Oropharynx is clear.  Eyes:     General: No scleral icterus.       Right eye: No discharge.        Left eye: No discharge.     Extraocular Movements: Extraocular movements intact.     Conjunctiva/sclera: Conjunctivae normal.     Pupils: Pupils are equal, round, and reactive to light.  Cardiovascular:     Rate and Rhythm: Normal rate and regular rhythm.     Pulses: Normal pulses.     Heart sounds: Normal heart sounds. No murmur heard.    No friction rub. No gallop.  Pulmonary:     Effort: Pulmonary effort is normal. No respiratory distress.     Breath sounds: Normal breath sounds. No stridor. No wheezing, rhonchi or rales.  Chest:     Chest wall: No tenderness.  Musculoskeletal:        General: Normal range of motion.     Cervical back: Normal range of motion and neck supple.  Skin:  General: Skin is warm and dry.     Capillary Refill: Capillary refill takes less than 2 seconds.     Coloration: Skin is not jaundiced or pale.     Findings: Rash (vesicular rash on R side and back) present. No bruising, erythema or lesion.  Neurological:     General: No focal deficit present.     Mental Status: She is alert and oriented to person, place, and time. Mental status is at baseline.  Psychiatric:        Mood and Affect: Mood normal.        Behavior: Behavior normal.        Thought Content: Thought content normal.        Judgment: Judgment normal.     Results for orders placed or performed in visit on 04/21/22  VITAMIN D 25 Hydroxy (Vit-D Deficiency, Fractures)  Result Value Ref Range   Vit D, 25-Hydroxy 61.1 30.0 - 100.0 ng/mL  Comprehensive metabolic panel  Result Value Ref Range   Glucose 90 70 - 99 mg/dL   BUN 15 6 - 24 mg/dL    Creatinine, Ser 5.78 0.57 - 1.00 mg/dL   eGFR 82 >46 NG/EXB/2.84   BUN/Creatinine Ratio 18 9 - 23   Sodium 144 134 - 144 mmol/L   Potassium 4.4 3.5 - 5.2 mmol/L   Chloride 105 96 - 106 mmol/L   CO2 24 20 - 29 mmol/L   Calcium 9.8 8.7 - 10.2 mg/dL   Total Protein 7.0 6.0 - 8.5 g/dL   Albumin 4.5 3.8 - 4.9 g/dL   Globulin, Total 2.5 1.5 - 4.5 g/dL   Albumin/Globulin Ratio 1.8 1.2 - 2.2   Bilirubin Total <0.2 0.0 - 1.2 mg/dL   Alkaline Phosphatase 65 44 - 121 IU/L   AST 12 0 - 40 IU/L   ALT 10 0 - 32 IU/L  Lipid Panel w/o Chol/HDL Ratio  Result Value Ref Range   Cholesterol, Total 234 (H) 100 - 199 mg/dL   Triglycerides 132 (H) 0 - 149 mg/dL   HDL 77 >44 mg/dL   VLDL Cholesterol Cal 55 (H) 5 - 40 mg/dL   LDL Chol Calc (NIH) 010 (H) 0 - 99 mg/dL  U72  Result Value Ref Range   Vitamin B-12 670 232 - 1,245 pg/mL  CA 125  Result Value Ref Range   Cancer Antigen (CA) 125 14.2 0.0 - 38.1 U/mL      Assessment & Plan:   Problem List Items Addressed This Visit   None Visit Diagnoses     Herpes zoster without complication    -  Primary   Will treat with valtrex. Call if not getting better or getting worse. If pain not better by tomorrow will sent in nortriptyline.   Relevant Medications   valACYclovir (VALTREX) 1000 MG tablet        Follow up plan: Return if symptoms worsen or fail to improve.

## 2022-07-04 ENCOUNTER — Encounter: Payer: Self-pay | Admitting: Family Medicine

## 2022-07-07 ENCOUNTER — Ambulatory Visit
Admission: RE | Admit: 2022-07-07 | Discharge: 2022-07-07 | Disposition: A | Discharge status: Home / Self Care | Payer: Managed Care, Other (non HMO) | Source: Ambulatory Visit | Attending: Family Medicine | Admitting: Family Medicine

## 2022-07-07 DIAGNOSIS — Z1231 Encounter for screening mammogram for malignant neoplasm of breast: Secondary | ICD-10-CM | POA: Insufficient documentation

## 2022-07-09 ENCOUNTER — Other Ambulatory Visit: Payer: Self-pay | Admitting: Family Medicine

## 2022-07-10 NOTE — Telephone Encounter (Signed)
Unable to refill per protocol, Rx request is too soon. Last refill 03/18/22 for 90 and 2 refills.  Requested Prescriptions  Pending Prescriptions Disp Refills   estradiol (ESTRACE) 0.5 MG tablet [Pharmacy Med Name: ESTRADIOL TABS 0.5MG ] 90 tablet 3    Sig: TAKE 1 TABLET DAILY     OB/GYN:  Estrogens Passed - 07/09/2022  6:23 PM      Passed - Mammogram is up-to-date per Health Maintenance      Passed - Last BP in normal range    BP Readings from Last 1 Encounters:  06/30/22 103/67         Passed - Valid encounter within last 12 months    Recent Outpatient Visits           1 week ago Herpes zoster without complication   [employer] [facility] [place], [name], DO   2 months ago Hot flashes   [place] [facility] [place], [name], DO   8 months ago Neoplasm of uncertain behavior of skin   [facility] [place], [name], DO   8 months ago Routine general medical examination at a health care facility   [facility] [place], [name], DO   1 year ago Hyperlipidemia, mixed   [place] [facility] [place], [name], DO       Future Appointments             In 3 months Laural Benes, [name], DO [name] [facility], [facility]

## 2022-10-06 ENCOUNTER — Other Ambulatory Visit: Payer: Self-pay | Admitting: Internal Medicine

## 2022-10-06 DIAGNOSIS — G4733 Obstructive sleep apnea (adult) (pediatric): Secondary | ICD-10-CM

## 2022-10-08 ENCOUNTER — Other Ambulatory Visit: Payer: Self-pay | Admitting: Internal Medicine

## 2022-10-08 DIAGNOSIS — G4733 Obstructive sleep apnea (adult) (pediatric): Secondary | ICD-10-CM

## 2022-10-14 ENCOUNTER — Other Ambulatory Visit: Payer: Self-pay | Admitting: Internal Medicine

## 2022-10-14 DIAGNOSIS — G4733 Obstructive sleep apnea (adult) (pediatric): Secondary | ICD-10-CM

## 2022-10-15 ENCOUNTER — Telehealth: Payer: Self-pay | Admitting: Internal Medicine

## 2022-10-15 DIAGNOSIS — G4733 Obstructive sleep apnea (adult) (pediatric): Secondary | ICD-10-CM

## 2022-10-15 MED ORDER — ZOLPIDEM TARTRATE 10 MG PO TABS
ORAL_TABLET | ORAL | 0 refills | Status: DC
Start: 2022-10-15 — End: 2022-10-17

## 2022-10-15 NOTE — Telephone Encounter (Signed)
Pt advised we send 3 tab  for Ambien

## 2022-10-16 ENCOUNTER — Other Ambulatory Visit: Payer: Self-pay | Admitting: Family Medicine

## 2022-10-17 ENCOUNTER — Other Ambulatory Visit: Payer: Self-pay | Admitting: Family Medicine

## 2022-10-17 ENCOUNTER — Ambulatory Visit: Payer: Managed Care, Other (non HMO) | Admitting: Physician Assistant

## 2022-10-17 VITALS — BP 110/70 | HR 71 | Temp 97.8°F | Resp 16 | Ht 68.0 in | Wt 170.0 lb

## 2022-10-17 DIAGNOSIS — F5101 Primary insomnia: Secondary | ICD-10-CM

## 2022-10-17 DIAGNOSIS — G4733 Obstructive sleep apnea (adult) (pediatric): Secondary | ICD-10-CM

## 2022-10-17 DIAGNOSIS — J4520 Mild intermittent asthma, uncomplicated: Secondary | ICD-10-CM

## 2022-10-17 MED ORDER — ZOLPIDEM TARTRATE 10 MG PO TABS: ORAL_TABLET | ORAL | 1 refills | Status: DC

## 2022-10-17 NOTE — Telephone Encounter (Signed)
Requested Prescriptions  Pending Prescriptions Disp Refills   Semaglutide-Weight Management (WEGOVY) 2.4 MG/0.75ML SOAJ [Pharmacy Med Name: WEGOVY PEN INJ 0.75ML 4'S 2.4MG ] 9 mL 0    Sig: INJECT 2.4 MG UNDER THE SKIN ONCE A WEEK     Endocrinology:  Diabetes - GLP-1 Receptor Agonists - semaglutide Failed - 10/16/2022 10:36 AM      Failed - HBA1C in normal range and within 180 days    No results found for: "HGBA1C", "LABA1C"       Passed - Cr in normal range and within 360 days    Creatinine  Date Value Ref Range Status  03/04/2012 0.73 0.60 - 1.30 mg/dL Final   Creatinine, Ser  Date Value Ref Range Status  04/21/2022 0.85 0.57 - 1.00 mg/dL Final         Passed - Valid encounter within last 6 months    Recent Outpatient Visits           3 months ago Herpes zoster without complication   [place] [facility] [place], [name], DO   5 months ago Hot flashes   [place] [facility] [place], [place], DO   12 months ago Neoplasm of uncertain behavior of skin   [place] [facility] [place], [name], DO   1 year ago Routine general medical examination at a health care facility   [facility] [place], [name], DO   1 year ago Hyperlipidemia, mixed   [place] [facility] [place], [name], DO       Future Appointments             In 3 days Dorcas [place], DO [place] [facility], [facility]

## 2022-10-17 NOTE — Progress Notes (Signed)
[facility] [address] [place], [place] [address]  Pulmonary Sleep Medicine   Office Visit Note  Patient Name: [name] DOB: 04-19-1970 MRN [id number]  Date of Service: 10/29/2022  Complaints/HPI: Pt is here for routine pulmonary follow up for med refills. She takes ambien nightly for insomnia and has done well with this for years without side effects. Needs a refill of this today. Breathing has been controlled.  ROS  General: (-) fever, (-) chills, (-) night sweats, (-) weakness Skin: (-) rashes, (-) itching,. Eyes: (-) visual changes, (-) redness, (-) itching. Nose and Sinuses: (-) nasal stuffiness or itchiness, (-) postnasal drip, (-) nosebleeds, (-) sinus trouble. Mouth and Throat: (-) sore throat, (-) hoarseness. Neck: (-) swollen glands, (-) enlarged thyroid, (-) neck pain. Respiratory: - cough, (-) bloody sputum, - shortness of breath, - wheezing. Cardiovascular: - ankle swelling, (-) chest pain. Lymphatic: (-) lymph node enlargement. Neurologic: (-) numbness, (-) tingling. Psychiatric: (-) anxiety, (-) depression   Current Medication: Outpatient Encounter Medications as of 10/17/2022  Medication Sig   albuterol (VENTOLIN HFA) 108 (90 Base) MCG/ACT inhaler Inhale 1-2 puffs into the lungs every 4 (four) hours as needed for wheezing or shortness of breath.   cetirizine (ZYRTEC) 10 MG tablet Take 10 mg by mouth daily.   cyclobenzaprine (FLEXERIL) 10 MG tablet Take 1 tablet (10 mg total) by mouth 3 (three) times daily as needed for muscle spasms.   EPINEPHrine (EPIPEN 2-PAK) 0.3 mg/0.3 mL IJ SOAJ injection Inject 0.3 mLs (0.3 mg total) into the muscle as needed for anaphylaxis.   estradiol (ESTRACE) 0.5 MG tablet TAKE 1 TABLET BY MOUTH DAILY   Insulin Syringe-Needle U-100 (INSULIN SYRINGE 1CC/31GX5/16") 31G X 5/16" 1 ML MISC USE FOR ALLERGIES TWO TIMES A WEEK   LORazepam (ATIVAN) 0.5 MG tablet Take 1 tablet (0.5 mg total) by mouth daily as needed for  anxiety.   montelukast (SINGULAIR) 10 MG tablet TAKE 1 TABLET(10 MG) BY MOUTH DAILY   naproxen (NAPROSYN) 500 MG tablet TAKE 1 TABLET(500 MG) BY MOUTH TWICE DAILY WITH A MEAL   roflumilast (DALIRESP) 500 MCG TABS tablet Take 1 tablet (500 mcg total) by mouth daily.   Semaglutide-Weight Management (WEGOVY) 2.4 MG/0.75ML SOAJ INJECT 2.4 MG UNDER THE SKIN ONCE A WEEK   triamcinolone ointment (KENALOG) 0.1 % APPLY TO THE AFFECTED AREA LEG, ARM, AND TRUNK TWICE DAILY. AVOID USE ON FACE   Vitamin D, Ergocalciferol, (DRISDOL) 1.25 MG (50000 UNIT) CAPS capsule TAKE 1 CAPSULE BY MOUTH ONCE A WEEK   [DISCONTINUED] buPROPion (WELLBUTRIN XL) 300 MG 24 hr tablet TAKE 1 TABLET BY MOUTH DAILY   [DISCONTINUED] escitalopram (LEXAPRO) 20 MG tablet Take 1 tablet (20 mg total) by mouth daily.   [DISCONTINUED] omeprazole (PRILOSEC) 40 MG capsule Take 1 capsule (40 mg total) by mouth in the morning and at bedtime. For 30 days.   [DISCONTINUED] zolpidem (AMBIEN) 10 MG tablet Take 1 tab po daily at bedtime as needed   zolpidem (AMBIEN) 10 MG tablet Take 1 tab po daily at bedtime as needed   No facility-administered encounter medications on file as of 10/17/2022.    Surgical History: Past Surgical History:  Procedure Laterality Date   APPENDECTOMY     ESOPHAGOGASTRODUODENOSCOPY (EGD) WITH PROPOFOL N/A 06/14/2019   Procedure: ESOPHAGOGASTRODUODENOSCOPY (EGD) WITH PROPOFOL;  Surgeon: Midge Minium, MD;  Location: [facility] ENDOSCOPY;  Service: Endoscopy;  Laterality: N/A;   OVARIAN CYST DRAINAGE Right 12/25/2001   supracervial hysterectomy     TONSILLECTOMY  TUBAL LIGATION      Medical History: Past Medical History:  Diagnosis Date   Allergic rhinitis    Anxiety    Asthma    Constipation    COPD (chronic obstructive pulmonary disease) (HCC)    Depression    Hematuria    Insomnia    Reflux     Family History: Family History  Problem Relation Age of Onset   Cancer Mother        ovarian   Stroke Father     Cancer Maternal Uncle        lung   Diabetes Maternal Grandmother    Emphysema Maternal Grandfather    Diabetes Paternal Grandmother    Stroke Paternal Grandfather    Breast cancer Neg Hx     Social History: Social History   Socioeconomic History   Marital status: Married    Spouse name: Not on file   Number of children: Not on file   Years of education: Not on file   Highest education level: Not on file  Occupational History   Not on file  Tobacco Use   Smoking status: Never   Smokeless tobacco: Never  Vaping Use   Vaping status: Never Used  Substance and Sexual Activity   Alcohol use: Yes    Comment: occasional   Drug use: No   Sexual activity: Yes  Other Topics Concern   Not on file  Social History Narrative   Not on file   Social Determinants of Health   Financial Resource Strain: Not on file  Food Insecurity: Not on file  Transportation Needs: Not on file  Physical Activity: Not on file  Stress: Not on file  Social Connections: Not on file  Intimate Partner Violence: Not on file    Vital Signs: Blood pressure 110/70, pulse 71, temperature 97.8 F (36.6 C), resp. rate 16, height 5\' 8"  (1.727 m), weight 170 lb (77.1 kg), SpO2 97%.  Examination: General Appearance: The patient is well-developed, well-nourished, and in no distress. Skin: Gross inspection of skin unremarkable. Head: normocephalic, no gross deformities. Eyes: no gross deformities noted. ENT: ears appear grossly normal no exudates. Neck: Supple. No thyromegaly. No LAD. Respiratory: Lungs clear to auscultation. Cardiovascular: Normal S1 and S2 without murmur or rub. Extremities: No cyanosis. pulses are equal. Neurologic: Alert and oriented. No involuntary movements.  LABS: No results found for this or any previous visit (from the past 2160 hour(s)).  Radiology: MM 3D SCREEN BREAST BILATERAL  Result Date: 07/09/2022 CLINICAL DATA:  Screening. EXAM: DIGITAL SCREENING BILATERAL  MAMMOGRAM WITH TOMOSYNTHESIS AND CAD TECHNIQUE: Bilateral screening digital craniocaudal and mediolateral oblique mammograms were obtained. Bilateral screening digital breast tomosynthesis was performed. The images were evaluated with computer-aided detection. COMPARISON:  Previous exam(s). ACR Breast Density Category b: There are scattered areas of fibroglandular density. FINDINGS: There are no findings suspicious for malignancy. IMPRESSION: No mammographic evidence of malignancy. A result letter of this screening mammogram will be mailed directly to the patient. RECOMMENDATION: Screening mammogram in one year. (Code:SM-B-01Y) BI-RADS CATEGORY  1: Negative. Electronically Signed   By: Harmon Pier M.D.   On: 07/09/2022 09:30    No results found.  No results found.    Assessment and Plan: Patient Active Problem List   Diagnosis Date Noted   History of obesity 04/21/2022   Vitamin D deficiency 03/29/2021   Tinnitus of both ears 03/29/2021   Hot flashes 01/14/2021   Dysphagia    Stricture and stenosis of esophagus  Palpitations 08/12/2018   Hyperlipidemia, mixed 08/02/2018   Precordial pain 08/02/2018   Family history of ovarian cancer 03/12/2016   Vitamin B12 deficiency (non anemic) 11/18/2014   Constipation    Asthma    Allergic rhinitis    Depression    Anxiety    Hematuria    Insomnia    GERD without esophagitis     1. Primary insomnia May continue ambien as needed for sleep - zolpidem (AMBIEN) 10 MG tablet; Take 1 tab po daily at bedtime as needed  Dispense: 90 tablet; Refill: 1  2. OSA on CPAP Continue therapy  3. Chronic asthma, mild intermittent, uncomplicated Controlled, continue current medication   General Counseling: I have discussed the findings of the evaluation and examination with [name].  I have also discussed any further diagnostic evaluation thatmay be needed or ordered today. [name] verbalizes understanding of the findings of todays visit. We also reviewed her  medications today and discussed drug interactions and side effects including but not limited excessive drowsiness and altered mental states. We also discussed that there is always a risk not just to her but also people around her. she has been encouraged to call the office with any questions or concerns that should arise related to todays visit.  No orders of the defined types were placed in this encounter.    Time spent: 30  I have personally obtained a history, examined the patient, evaluated laboratory and imaging results, formulated the assessment and plan and placed orders. This patient was seen by Lynn Ito, PA-C in collaboration with Dr. Freda Munro as a part of collaborative care agreement.     Yevonne Pax, MD [facility] Pulmonary and Critical Care Sleep medicine

## 2022-10-20 ENCOUNTER — Ambulatory Visit: Payer: Managed Care, Other (non HMO) | Admitting: Family Medicine

## 2022-10-20 NOTE — Telephone Encounter (Signed)
Requested Prescriptions  Pending Prescriptions Disp Refills   WEGOVY 2.4 MG/0.75ML SOAJ [Pharmacy Med Name: WEGOVY PEN INJ 0.75ML 4'S 2.4MG ] 9 mL 3    Sig: INJECT 2.4 MG UNDER THE SKIN ONCE A WEEK FOR 28 DAYS     Endocrinology:  Diabetes - GLP-1 Receptor Agonists - semaglutide Failed - 10/17/2022  2:37 PM      Failed - HBA1C in normal range and within 180 days    No results found for: "HGBA1C", "LABA1C"       Passed - Cr in normal range and within 360 days    Creatinine  Date Value Ref Range Status  03/04/2012 0.73 0.60 - 1.30 mg/dL Final   Creatinine, Ser  Date Value Ref Range Status  04/21/2022 0.85 0.57 - 1.00 mg/dL Final         Passed - Valid encounter within last 6 months    Recent Outpatient Visits           3 months ago Herpes zoster without complication   [place] [facility] [place], [name], DO   6 months ago Hot flashes   [place] [facility] [place], [place], DO   12 months ago Neoplasm of uncertain behavior of skin   [place] [facility] [place], [name], DO   1 year ago Routine general medical examination at a health care facility   [facility] [place], [name], DO   1 year ago Hyperlipidemia, mixed   [place] [facility] [place], [name], DO               escitalopram (LEXAPRO) 20 MG tablet [Pharmacy Med Name: ESCITALOPRAM TABS 20MG ] 90 tablet 0    Sig: TAKE 1 TABLET DAILY     Psychiatry:  Antidepressants - SSRI Passed - 10/17/2022  2:37 PM      Passed - Completed PHQ-2 or PHQ-9 in the last 360 days      Passed - Valid encounter within last 6 months    Recent Outpatient Visits           3 months ago Herpes zoster without complication   [place] [facility] [place], [name], DO   6 months ago Hot flashes   [place] [facility] [place], [place], DO   12 months ago Neoplasm of uncertain behavior of skin   [place] [facility] [place], [name], DO   1 year ago Routine general medical examination at a health care facility   [facility] [place], [name], DO   1 year ago Hyperlipidemia, mixed   [place] [facility] [place], [name], DO               omeprazole (PRILOSEC) 40 MG capsule [Pharmacy Med Name: OMEPRAZOLE DR CAPS 40MG ] 180 capsule 0    Sig: TAKE 1 CAPSULE IN THE MORNING AND AT BEDTIME     Gastroenterology: Proton Pump Inhibitors Passed - 10/17/2022  2:37 PM      Passed - Valid encounter within last 12 months    Recent Outpatient Visits           3 months ago Herpes zoster without complication   [place] [facility] [place], [name], DO   6 months ago Hot flashes   [place] [facility] [place], [name], DO   12 months ago Neoplasm of uncertain behavior of skin  [place] [facility] [place], [name], DO   1 year ago Routine general medical examination at a health care facility   [facility], [name], DO   1 year ago Hyperlipidemia, mixed   [place] [facility] [place], [name], DO               buPROPion (WELLBUTRIN XL) 300 MG 24 hr tablet [Pharmacy Med Name: BUPROPION HCL XL TABS 300MG ] 90 tablet 0    Sig: TAKE 1 TABLET DAILY     Psychiatry: Antidepressants - bupropion Passed - 10/17/2022  2:37 PM      Passed - Cr in normal range and within 360 days    Creatinine  Date Value Ref Range Status  03/04/2012 0.73 0.60 - 1.30 mg/dL Final   Creatinine, Ser  Date Value Ref Range Status  04/21/2022 0.85 0.57 - 1.00 mg/dL Final         Passed - AST in normal range and within 360 days    AST  Date Value Ref Range Status  04/21/2022 12 0 - 40 IU/L Final         Passed - ALT in normal range and within 360 days    ALT  Date Value Ref Range Status  04/21/2022 10 0 - 32 IU/L Final         Passed - Completed PHQ-2 or PHQ-9 in the last  360 days      Passed - Last BP in normal range    BP Readings from Last 1 Encounters:  10/17/22 110/70         Passed - Valid encounter within last 6 months    Recent Outpatient Visits           3 months ago Herpes zoster without complication   [place] [facility] [place], [name], DO   6 months ago Hot flashes   [place] [facility] [place], [place], DO   12 months ago Neoplasm of uncertain behavior of skin   [place] [facility] [place], [name], DO   1 year ago Routine general medical examination at a health care facility   [facility] [place], [name], DO   1 year ago Hyperlipidemia, mixed   [place] [facility] [place], [place], DO

## 2022-11-03 ENCOUNTER — Other Ambulatory Visit: Payer: Self-pay | Admitting: Internal Medicine

## 2022-11-03 DIAGNOSIS — J4530 Mild persistent asthma, uncomplicated: Secondary | ICD-10-CM

## 2022-11-10 ENCOUNTER — Encounter: Payer: Self-pay | Admitting: Family Medicine

## 2022-11-10 ENCOUNTER — Ambulatory Visit (INDEPENDENT_AMBULATORY_CARE_PROVIDER_SITE_OTHER): Payer: Managed Care, Other (non HMO) | Admitting: Family Medicine

## 2022-11-10 VITALS — BP 114/78 | HR 73 | Temp 97.8°F | Ht 67.0 in | Wt 166.4 lb

## 2022-11-10 DIAGNOSIS — E538 Deficiency of other specified B group vitamins: Secondary | ICD-10-CM

## 2022-11-10 DIAGNOSIS — Z0000 Encounter for general adult medical examination without abnormal findings: Secondary | ICD-10-CM | POA: Diagnosis not present

## 2022-11-10 DIAGNOSIS — Z8639 Personal history of other endocrine, nutritional and metabolic disease: Secondary | ICD-10-CM | POA: Diagnosis not present

## 2022-11-10 DIAGNOSIS — E559 Vitamin D deficiency, unspecified: Secondary | ICD-10-CM | POA: Diagnosis not present

## 2022-11-10 DIAGNOSIS — E782 Mixed hyperlipidemia: Secondary | ICD-10-CM

## 2022-11-10 DIAGNOSIS — F330 Major depressive disorder, recurrent, mild: Secondary | ICD-10-CM

## 2022-11-10 DIAGNOSIS — F419 Anxiety disorder, unspecified: Secondary | ICD-10-CM | POA: Diagnosis not present

## 2022-11-10 MED ORDER — ESCITALOPRAM OXALATE 20 MG PO TABS: 20.0000 mg | ORAL_TABLET | Freq: Every day | ORAL | 1 refills | Status: DC

## 2022-11-10 MED ORDER — MONTELUKAST SODIUM 10 MG PO TABS: ORAL_TABLET | ORAL | 1 refills | Status: DC

## 2022-11-10 MED ORDER — ALBUTEROL SULFATE HFA 108 (90 BASE) MCG/ACT IN AERS: 1.0000 | INHALATION_SPRAY | RESPIRATORY_TRACT | 6 refills | Status: DC | PRN

## 2022-11-10 MED ORDER — ONDANSETRON HCL 4 MG PO TABS: 4.0000 mg | ORAL_TABLET | Freq: Three times a day (TID) | ORAL | 1 refills | Status: DC | PRN

## 2022-11-10 MED ORDER — WEGOVY 1.7 MG/0.75ML ~~LOC~~ SOAJ: 1.7000 mg | SUBCUTANEOUS | 1 refills | Status: DC

## 2022-11-10 MED ORDER — ESTRADIOL 0.5 MG PO TABS: 0.5000 mg | ORAL_TABLET | Freq: Every day | ORAL | 2 refills | Status: DC

## 2022-11-10 MED ORDER — BUPROPION HCL ER (XL) 300 MG PO TB24: ORAL_TABLET | ORAL | 1 refills | Status: DC

## 2022-11-10 MED ORDER — NAPROXEN 500 MG PO TABS: ORAL_TABLET | ORAL | 1 refills | Status: DC

## 2022-11-10 NOTE — Assessment & Plan Note (Signed)
Under good control on current regimen. Continue current regimen. Continue to monitor. Call with any concerns. Refills given.   

## 2022-11-10 NOTE — Progress Notes (Signed)
BP 114/78   Pulse 73   Temp 97.8 F (36.6 C) (Oral)   Ht 5\' 7"  (1.702 m)   Wt 166 lb 6.4 oz (75.5 kg)   SpO2 97%   BMI 26.06 kg/m    Subjective:    Patient ID: [name], female    DOB: Jul 14, 1970, 52 y.o.   MRN: [id number]  HPI: [name] is a 52 y.o. female presenting on 11/10/2022 for comprehensive medical examination. Current medical complaints include:  DEPRESSION Mood status: controlled Satisfied with current treatment?: yes Symptom severity: mild  Duration of current treatment : chronic Side effects: no Medication compliance: excellent Psychotherapy/counseling: no  Previous psychiatric medications: wellbutrin, lexapro Depressed mood: no Anxious mood: no Anhedonia: no Significant weight loss or gain: no Insomnia: no  Fatigue: yes Feelings of worthlessness or guilt: no Impaired concentration/indecisiveness: no Suicidal ideations: no Hopelessness: no Crying spells: no    11/10/2022    8:42 AM 06/30/2022    2:08 PM 04/21/2022    1:29 PM 10/17/2021    1:23 PM 03/29/2021   10:09 AM  Depression screen PHQ 2/9  Decreased Interest 1 0 0 0 0  Down, Depressed, Hopeless 0 0 0 0 0  PHQ - 2 Score 1 0 0 0 0  Altered sleeping 2 1 1  0 1  Tired, decreased energy 2 1 1  0 1  Change in appetite 0 0 0 0 0  Feeling bad or failure about yourself  0 0 0 0 0  Trouble concentrating 0 0 0 0 1  Moving slowly or fidgety/restless 0 0 0 0 0  Suicidal thoughts 0 0 0 0 0  PHQ-9 Score 5 2 2  0 3  Difficult doing work/chores Somewhat difficult Not difficult at all Not difficult at all Not difficult at all Somewhat difficult   HYPERLIPIDEMIA Hyperlipidemia status: excellent compliance Satisfied with current treatment?  yes Side effects:  no Medication compliance: excellent compliance Past cholesterol meds: none Supplements: none Aspirin:  no The 10-year ASCVD risk score (Arnett DK, et al., 2019) is: 1.2%   Values used to calculate the score:     Age: 40 years     Sex: Female      Is Non-Hispanic African American: No     Diabetic: No     Tobacco smoker: No     Systolic Blood Pressure: 114 mmHg     Is BP treated: No     HDL Cholesterol: 77 mg/dL     Total Cholesterol: 234 mg/dL Chest pain:  no Coronary artery disease:  no  OBESITY Duration: chronic Previous attempts at weight loss: yes Complications of obesity: depression, GER Peak weight: 192 Weight loss goal: to maintain  Weight loss to date: 35lbs Requesting obesity pharmacotherapy: yes Current weight loss supplements/medications: yes Previous weight loss supplements/meds: no   She currently lives with: husband Menopausal Symptoms: no  Depression Screen done today and results listed below:     11/10/2022    8:42 AM 06/30/2022    2:08 PM 04/21/2022    1:29 PM 10/17/2021    1:23 PM 03/29/2021   10:09 AM  Depression screen PHQ 2/9  Decreased Interest 1 0 0 0 0  Down, Depressed, Hopeless 0 0 0 0 0  PHQ - 2 Score 1 0 0 0 0  Altered sleeping 2 1 1  0 1  Tired, decreased energy 2 1 1  0 1  Change in appetite 0 0 0 0 0  Feeling bad or failure about yourself  0 0 0 0 0  Trouble concentrating 0 0 0 0 1  Moving slowly or fidgety/restless 0 0 0 0 0  Suicidal thoughts 0 0 0 0 0  PHQ-9 Score 5 2 2  0 3  Difficult doing work/chores Somewhat difficult Not difficult at all Not difficult at all Not difficult at all Somewhat difficult    Past Medical History:  Past Medical History:  Diagnosis Date   Allergic rhinitis    Anxiety    Asthma    Constipation    COPD (chronic obstructive pulmonary disease) (HCC)    Depression    Hematuria    Insomnia    Reflux     Surgical History:  Past Surgical History:  Procedure Laterality Date   APPENDECTOMY     ESOPHAGOGASTRODUODENOSCOPY (EGD) WITH PROPOFOL N/A 06/14/2019   Procedure: ESOPHAGOGASTRODUODENOSCOPY (EGD) WITH PROPOFOL;  Surgeon: Midge Minium, MD;  Location: [facility] ENDOSCOPY;  Service: Endoscopy;  Laterality: N/A;   OVARIAN CYST DRAINAGE Right 12/25/2001    supracervial hysterectomy     TONSILLECTOMY     TUBAL LIGATION      Medications:  Current Outpatient Medications on File Prior to Visit  Medication Sig   cetirizine (ZYRTEC) 10 MG tablet Take 10 mg by mouth daily.   cyclobenzaprine (FLEXERIL) 10 MG tablet Take 1 tablet (10 mg total) by mouth 3 (three) times daily as needed for muscle spasms.   EPINEPHrine (EPIPEN 2-PAK) 0.3 mg/0.3 mL IJ SOAJ injection Inject 0.3 mLs (0.3 mg total) into the muscle as needed for anaphylaxis.   Insulin Syringe-Needle U-100 (INSULIN SYRINGE 1CC/31GX5/16") 31G X 5/16" 1 ML MISC USE FOR ALLERGIES TWO TIMES A WEEK   LORazepam (ATIVAN) 0.5 MG tablet Take 1 tablet (0.5 mg total) by mouth daily as needed for anxiety.   omeprazole (PRILOSEC) 40 MG capsule TAKE 1 CAPSULE IN THE MORNING AND AT BEDTIME   roflumilast (DALIRESP) 500 MCG TABS tablet TAKE 1 TABLET BY MOUTH DAILY   triamcinolone ointment (KENALOG) 0.1 % APPLY TO THE AFFECTED AREA LEG, ARM, AND TRUNK TWICE DAILY. AVOID USE ON FACE   Vitamin D, Ergocalciferol, (DRISDOL) 1.25 MG (50000 UNIT) CAPS capsule TAKE 1 CAPSULE BY MOUTH ONCE A WEEK   zolpidem (AMBIEN) 10 MG tablet Take 1 tab po daily at bedtime as needed   No current facility-administered medications on file prior to visit.    Allergies:  Allergies  Allergen Reactions   Penicillins Anaphylaxis   Sulfa Antibiotics Other (See Comments)    Bad taste in mouth    Social History:  Social History   Socioeconomic History   Marital status: Married    Spouse name: Not on file   Number of children: Not on file   Years of education: Not on file   Highest education level: Not on file  Occupational History   Not on file  Tobacco Use   Smoking status: Never   Smokeless tobacco: Never  Vaping Use   Vaping status: Never Used  Substance and Sexual Activity   Alcohol use: Yes    Comment: occasional   Drug use: No   Sexual activity: Yes  Other Topics Concern   Not on file  Social History Narrative    Not on file   Social Determinants of Health   Financial Resource Strain: Not on file  Food Insecurity: Not on file  Transportation Needs: Not on file  Physical Activity: Not on file  Stress: Not on file  Social Connections: Not on file  Intimate Partner  Violence: Not on file   Social History   Tobacco Use  Smoking Status Never  Smokeless Tobacco Never   Social History   Substance and Sexual Activity  Alcohol Use Yes   Comment: occasional    Family History:  Family History  Problem Relation Age of Onset   Cancer Mother        ovarian   Stroke Father    Cancer Maternal Uncle        lung   Diabetes Maternal Grandmother    Emphysema Maternal Grandfather    Diabetes Paternal Grandmother    Stroke Paternal Grandfather    Breast cancer Neg Hx     Past medical history, surgical history, medications, allergies, family history and social history reviewed with patient today and changes made to appropriate areas of the chart.   Review of Systems  Constitutional:  Positive for malaise/fatigue. Negative for chills, diaphoresis, fever and weight loss.  HENT: Negative.    Eyes: Negative.   Respiratory: Negative.    Cardiovascular: Negative.   Gastrointestinal: Negative.   Genitourinary: Negative.   Musculoskeletal: Negative.   Skin: Negative.   Neurological: Negative.   Endo/Heme/Allergies:  Positive for environmental allergies. Negative for polydipsia. Does not bruise/bleed easily.  Psychiatric/Behavioral: Negative.     All other ROS negative except what is listed above and in the HPI.      Objective:    BP 114/78   Pulse 73   Temp 97.8 F (36.6 C) (Oral)   Ht 5\' 7"  (1.702 m)   Wt 166 lb 6.4 oz (75.5 kg)   SpO2 97%   BMI 26.06 kg/m   Wt Readings from Last 3 Encounters:  11/10/22 166 lb 6.4 oz (75.5 kg)  10/17/22 170 lb (77.1 kg)  06/30/22 162 lb 14.4 oz (73.9 kg)    Physical Exam Vitals and nursing note reviewed.  Constitutional:      General: She is  not in acute distress.    Appearance: Normal appearance. She is not ill-appearing, toxic-appearing or diaphoretic.  HENT:     Head: Normocephalic and atraumatic.     Right Ear: Tympanic membrane, ear canal and external ear normal. There is no impacted cerumen.     Left Ear: Tympanic membrane, ear canal and external ear normal. There is no impacted cerumen.     Nose: Nose normal. No congestion or rhinorrhea.     Mouth/Throat:     Mouth: Mucous membranes are moist.     Pharynx: Oropharynx is clear. No oropharyngeal exudate or posterior oropharyngeal erythema.  Eyes:     General: No scleral icterus.       Right eye: No discharge.        Left eye: No discharge.     Extraocular Movements: Extraocular movements intact.     Conjunctiva/sclera: Conjunctivae normal.     Pupils: Pupils are equal, round, and reactive to light.  Neck:     Vascular: No carotid bruit.  Cardiovascular:     Rate and Rhythm: Normal rate and regular rhythm.     Pulses: Normal pulses.     Heart sounds: No murmur heard.    No friction rub. No gallop.  Pulmonary:     Effort: Pulmonary effort is normal. No respiratory distress.     Breath sounds: Normal breath sounds. No stridor. No wheezing, rhonchi or rales.  Chest:     Chest wall: No tenderness.  Abdominal:     General: Abdomen is flat. Bowel sounds are normal. There is  no distension.     Palpations: Abdomen is soft. There is no mass.     Tenderness: There is no abdominal tenderness. There is no right CVA tenderness, left CVA tenderness, guarding or rebound.     Hernia: No hernia is present.  Genitourinary:    Comments: Breast and pelvic exams deferred with shared decision making Musculoskeletal:        General: No swelling, tenderness, deformity or signs of injury.     Cervical back: Normal range of motion and neck supple. No rigidity. No muscular tenderness.     Right lower leg: No edema.     Left lower leg: No edema.  Lymphadenopathy:     Cervical: No  cervical adenopathy.  Skin:    General: Skin is warm and dry.     Capillary Refill: Capillary refill takes less than 2 seconds.     Coloration: Skin is not jaundiced or pale.     Findings: No bruising, erythema, lesion or rash.  Neurological:     General: No focal deficit present.     Mental Status: She is alert and oriented to person, place, and time. Mental status is at baseline.     Cranial Nerves: No cranial nerve deficit.     Sensory: No sensory deficit.     Motor: No weakness.     Coordination: Coordination normal.     Gait: Gait normal.     Deep Tendon Reflexes: Reflexes normal.  Psychiatric:        Mood and Affect: Mood normal.        Behavior: Behavior normal.        Thought Content: Thought content normal.        Judgment: Judgment normal.     Results for orders placed or performed in visit on 04/21/22  VITAMIN D 25 Hydroxy (Vit-D Deficiency, Fractures)  Result Value Ref Range   Vit D, 25-Hydroxy 61.1 30.0 - 100.0 ng/mL  Comprehensive metabolic panel  Result Value Ref Range   Glucose 90 70 - 99 mg/dL   BUN 15 6 - 24 mg/dL   Creatinine, Ser 5.00 0.57 - 1.00 mg/dL   eGFR 82 >93 GH/WEX/9.37   BUN/Creatinine Ratio 18 9 - 23   Sodium 144 134 - 144 mmol/L   Potassium 4.4 3.5 - 5.2 mmol/L   Chloride 105 96 - 106 mmol/L   CO2 24 20 - 29 mmol/L   Calcium 9.8 8.7 - 10.2 mg/dL   Total Protein 7.0 6.0 - 8.5 g/dL   Albumin 4.5 3.8 - 4.9 g/dL   Globulin, Total 2.5 1.5 - 4.5 g/dL   Albumin/Globulin Ratio 1.8 1.2 - 2.2   Bilirubin Total <0.2 0.0 - 1.2 mg/dL   Alkaline Phosphatase 65 44 - 121 IU/L   AST 12 0 - 40 IU/L   ALT 10 0 - 32 IU/L  Lipid Panel w/o Chol/HDL Ratio  Result Value Ref Range   Cholesterol, Total 234 (H) 100 - 199 mg/dL   Triglycerides 169 (H) 0 - 149 mg/dL   HDL 77 >67 mg/dL   VLDL Cholesterol Cal 55 (H) 5 - 40 mg/dL   LDL Chol Calc (NIH) 893 (H) 0 - 99 mg/dL  Y10  Result Value Ref Range   Vitamin B-12 670 232 - 1,245 pg/mL  CA 125  Result Value  Ref Range   Cancer Antigen (CA) 125 14.2 0.0 - 38.1 U/mL      Assessment & Plan:   Problem List Items Addressed This Visit  Other   Depression    Under good control on current regimen. Continue current regimen. Continue to monitor. Call with any concerns. Refills given.        Relevant Medications   buPROPion (WELLBUTRIN XL) 300 MG 24 hr tablet   escitalopram (LEXAPRO) 20 MG tablet   Anxiety    Under good control on current regimen. Continue current regimen. Continue to monitor. Call with any concerns. Refills given.        Relevant Medications   buPROPion (WELLBUTRIN XL) 300 MG 24 hr tablet   escitalopram (LEXAPRO) 20 MG tablet   Vitamin B12 deficiency (non anemic)    Rechecking labs today. Await results. Treat as needed.       Relevant Orders   B12   Hyperlipidemia, mixed    Under good control on current regimen. Continue current regimen. Continue to monitor. Call with any concerns. Refills given.        Vitamin D deficiency    Rechecking labs today. Await results. Treat as needed.       Relevant Orders   VITAMIN D 25 Hydroxy (Vit-D Deficiency, Fractures)   History of obesity    Doing great. Tolerating her wegovy well. Would like to try to titrate down- drop to 1.7mg  and recheck in 3-4 months with goal of cutting down further if maintaining.      Other Visit Diagnoses     Routine general medical examination at a health care facility    -  Primary   Vaccines up to date/declined. Screening labs checked today. Pap, mammo and cologuard up to date. Continue diet and exercise. Call with any concerns.   Relevant Orders   CBC with Differential/Platelet   Comprehensive metabolic panel   Lipid Panel w/o Chol/HDL Ratio   TSH        Follow up plan: Return 3-4 months.   LABORATORY TESTING:  - Pap smear: up to date  IMMUNIZATIONS:   - Tdap: Tetanus vaccination status reviewed: last tetanus booster within 10 years. - Influenza: Refused - Pneumovax: Not  applicable - Prevnar: Not applicable - COVID: Refused - HPV: Not applicable - Shingrix vaccine: Refused  SCREENING: -Mammogram: Up to date  - Colonoscopy: Up to date   PATIENT COUNSELING:   Advised to take 1 mg of folate supplement per day if capable of pregnancy.   Sexuality: Discussed sexually transmitted diseases, partner selection, use of condoms, avoidance of unintended pregnancy  and contraceptive alternatives.   Advised to avoid cigarette smoking.  I discussed with the patient that most people either abstain from alcohol or drink within safe limits (<=14/week and <=4 drinks/occasion for males, <=7/weeks and <= 3 drinks/occasion for females) and that the risk for alcohol disorders and other health effects rises proportionally with the number of drinks per week and how often a drinker exceeds daily limits.  Discussed cessation/primary prevention of drug use and availability of treatment for abuse.   Diet: Encouraged to adjust caloric intake to maintain  or achieve ideal body weight, to reduce intake of dietary saturated fat and total fat, to limit sodium intake by avoiding high sodium foods and not adding table salt, and to maintain adequate dietary potassium and calcium preferably from fresh fruits, vegetables, and low-fat dairy products.    stressed the importance of regular exercise  Injury prevention: Discussed safety belts, safety helmets, smoke detector, smoking near bedding or upholstery.   Dental health: Discussed importance of regular tooth brushing, flossing, and dental visits.    NEXT PREVENTATIVE  PHYSICAL DUE IN 1 YEAR. Return 3-4 months.

## 2022-11-10 NOTE — Assessment & Plan Note (Signed)
Doing great. Tolerating her wegovy well. Would like to try to titrate down- drop to 1.7mg  and recheck in 3-4 months with goal of cutting down further if maintaining.

## 2022-11-10 NOTE — Assessment & Plan Note (Signed)
Rechecking labs today. Await results. Treat as needed.  °

## 2022-11-11 LAB — CBC WITH DIFFERENTIAL/PLATELET
Basophils Absolute: 0.1 10*3/uL (ref 0.0–0.2)
Basos: 2 %
EOS (ABSOLUTE): 0.3 10*3/uL (ref 0.0–0.4)
Eos: 6 %
Hematocrit: 41.5 % (ref 34.0–46.6)
Hemoglobin: 13.2 g/dL (ref 11.1–15.9)
Immature Grans (Abs): 0 10*3/uL (ref 0.0–0.1)
Immature Granulocytes: 0 %
Lymphocytes Absolute: 2.6 10*3/uL (ref 0.7–3.1)
Lymphs: 46 %
MCH: 30.8 pg (ref 26.6–33.0)
MCHC: 31.8 g/dL (ref 31.5–35.7)
MCV: 97 fL (ref 79–97)
Monocytes Absolute: 0.3 10*3/uL (ref 0.1–0.9)
Monocytes: 6 %
Neutrophils Absolute: 2.2 10*3/uL (ref 1.4–7.0)
Neutrophils: 40 %
Platelets: 260 10*3/uL (ref 150–450)
RBC: 4.28 x10E6/uL (ref 3.77–5.28)
RDW: 12.2 % (ref 11.7–15.4)
WBC: 5.5 10*3/uL (ref 3.4–10.8)

## 2022-11-11 LAB — COMPREHENSIVE METABOLIC PANEL
ALT: 7 IU/L (ref 0–32)
AST: 11 IU/L (ref 0–40)
Albumin: 4.6 g/dL (ref 3.8–4.9)
Alkaline Phosphatase: 58 IU/L (ref 44–121)
BUN/Creatinine Ratio: 15 (ref 9–23)
BUN: 12 mg/dL (ref 6–24)
Bilirubin Total: 0.2 mg/dL (ref 0.0–1.2)
CO2: 26 mmol/L (ref 20–29)
Calcium: 9.4 mg/dL (ref 8.7–10.2)
Chloride: 99 mmol/L (ref 96–106)
Creatinine, Ser: 0.78 mg/dL (ref 0.57–1.00)
Globulin, Total: 2 g/dL (ref 1.5–4.5)
Glucose: 90 mg/dL (ref 70–99)
Potassium: 4.1 mmol/L (ref 3.5–5.2)
Sodium: 139 mmol/L (ref 134–144)
Total Protein: 6.6 g/dL (ref 6.0–8.5)
eGFR: 90 mL/min/{1.73_m2} (ref >59)

## 2022-11-11 LAB — TSH: TSH: 3.27 u[IU]/mL (ref 0.450–4.500)

## 2022-11-11 LAB — LIPID PANEL W/O CHOL/HDL RATIO
Cholesterol, Total: 236 mg/dL — ABNORMAL HIGH (ref 100–199)
HDL: 66 mg/dL (ref >39)
LDL Chol Calc (NIH): 114 mg/dL — ABNORMAL HIGH (ref 0–99)
Triglycerides: 324 mg/dL — ABNORMAL HIGH (ref 0–149)
VLDL Cholesterol Cal: 56 mg/dL — ABNORMAL HIGH (ref 5–40)

## 2022-11-11 LAB — VITAMIN D 25 HYDROXY (VIT D DEFICIENCY, FRACTURES): Vit D, 25-Hydroxy: 63.1 ng/mL (ref 30.0–100.0)

## 2022-11-11 LAB — VITAMIN B12: Vitamin B-12: 485 pg/mL (ref 232–1245)

## 2022-11-18 ENCOUNTER — Encounter: Payer: Self-pay | Admitting: Family Medicine

## 2022-11-24 NOTE — Telephone Encounter (Signed)
Can we check what's going on with her PA?

## 2022-11-25 ENCOUNTER — Ambulatory Visit: Payer: Managed Care, Other (non HMO) | Admitting: Internal Medicine

## 2022-12-01 NOTE — Telephone Encounter (Signed)
Did they have a reason?

## 2022-12-02 ENCOUNTER — Ambulatory Visit: Payer: Managed Care, Other (non HMO) | Admitting: Internal Medicine

## 2022-12-26 ENCOUNTER — Other Ambulatory Visit: Payer: Self-pay | Admitting: Family Medicine

## 2022-12-26 NOTE — Telephone Encounter (Signed)
Requested Prescriptions  Pending Prescriptions Disp Refills   omeprazole (PRILOSEC) 40 MG capsule [Pharmacy Med Name: OMEPRAZOLE DR CAPS 40MG ] 180 capsule 2    Sig: TAKE 1 CAPSULE IN THE MORNING AND AT BEDTIME     Gastroenterology: Proton Pump Inhibitors Passed - 12/26/2022  9:06 AM      Passed - Valid encounter within last 12 months    Recent Outpatient Visits           1 month ago Routine general medical examination at a health care facility   [facility], [name], DO   5 months ago Herpes zoster without complication   [place] [facility] [place], [name], DO   8 months ago Hot flashes   [place] [facility] [place], [place], DO   1 year ago Neoplasm of uncertain behavior of skin   [facility] [place], [name], DO   1 year ago Routine general medical examination at a health care facility   [facility] Dorcas Carrow, DO       Future Appointments             In 2 months Dorcas Carrow, DO [place] [facility]

## 2023-01-29 ENCOUNTER — Other Ambulatory Visit: Payer: Self-pay | Admitting: Family Medicine

## 2023-01-30 NOTE — Telephone Encounter (Signed)
Requested medications are due for refill today.  yes  Requested medications are on the active medications list.  yes  Last refill. 05/23/2022 #12 2 rf  Future visit scheduled.   yes  Notes to clinic.  Provider to review for refill.    Requested Prescriptions  Pending Prescriptions Disp Refills   Vitamin D, Ergocalciferol, (DRISDOL) 1.25 MG (50000 UNIT) CAPS capsule [Pharmacy Med Name: VITAMIN D2 1.25MG (50,000 UNIT)] 12 capsule 2    Sig: TAKE 1 CAPSULE BY MOUTH ONE TIME PER WEEK     Endocrinology:  Vitamins - Vitamin D Supplementation 2 Failed - 01/29/2023  2:34 PM      Failed - Manual Review: Route requests for 50,000 IU strength to the provider      Passed - Ca in normal range and within 360 days    Calcium  Date Value Ref Range Status  11/10/2022 9.4 8.7 - 10.2 mg/dL Final   Calcium, Total  Date Value Ref Range Status  03/04/2012 9.5 8.5 - 10.1 mg/dL Final         Passed - Vitamin D in normal range and within 360 days    Vit D, 25-Hydroxy  Date Value Ref Range Status  11/10/2022 63.1 30.0 - 100.0 ng/mL Final    Comment:    Vitamin D deficiency has been defined by the Institute of Medicine and an Endocrine Society practice guideline as a level of serum 25-OH vitamin D less than 20 ng/mL (1,2). The Endocrine Society went on to further define vitamin D insufficiency as a level between 21 and 29 ng/mL (2). 1. IOM (Institute of Medicine). 2010. Dietary reference    intakes for calcium and D. [place]: The    [employer]. 2. Holick MF, Binkley [place], Bischoff-Ferrari HA, et al.    Evaluation, treatment, and prevention of vitamin D    deficiency: an Endocrine Society clinical practice    guideline. JCEM. 2011 Jul; 96(7):1911-30.          Passed - Valid encounter within last 12 months    Recent Outpatient Visits           2 months ago Routine general medical examination at a health care facility   [facility], [name], DO    7 months ago Herpes zoster without complication   [place] [facility] [place], [name], DO   9 months ago Hot flashes   [place] [facility] [place], [place], DO   1 year ago Neoplasm of uncertain behavior of skin   [place] [facility] [place], [name], DO   1 year ago Routine general medical examination at a health care facility   [facility] Dorcas Carrow, DO       Future Appointments             In 1 month [place], [name], DO [place] [facility]

## 2023-02-03 ENCOUNTER — Telehealth: Payer: Managed Care, Other (non HMO) | Admitting: Physician Assistant

## 2023-02-03 ENCOUNTER — Encounter: Payer: Self-pay | Admitting: Family Medicine

## 2023-02-03 DIAGNOSIS — J019 Acute sinusitis, unspecified: Secondary | ICD-10-CM | POA: Diagnosis not present

## 2023-02-03 DIAGNOSIS — B9689 Other specified bacterial agents as the cause of diseases classified elsewhere: Secondary | ICD-10-CM

## 2023-02-03 MED ORDER — AZITHROMYCIN 250 MG PO TABS: ORAL_TABLET | ORAL | 0 refills | Status: AC

## 2023-02-03 MED ORDER — PREDNISONE 20 MG PO TABS: 40.0000 mg | ORAL_TABLET | Freq: Every day | ORAL | 0 refills | Status: DC

## 2023-02-03 NOTE — Progress Notes (Signed)
Virtual Visit Consent   [name], you are scheduled for a virtual visit with a [facility] provider today. Just as with appointments in the office, your consent must be obtained to participate. Your consent will be active for this visit and any virtual visit you may have with one of our providers in the next 365 days. If you have a MyChart account, a copy of this consent can be sent to you electronically.  As this is a virtual visit, video technology does not allow for your provider to perform a traditional examination. This may limit your provider's ability to fully assess your condition. If your provider identifies any concerns that need to be evaluated in person or the need to arrange testing (such as labs, EKG, etc.), we will make arrangements to do so. Although advances in technology are sophisticated, we cannot ensure that it will always work on either your end or our end. If the connection with a video visit is poor, the visit may have to be switched to a telephone visit. With either a video or telephone visit, we are not always able to ensure that we have a secure connection.  By engaging in this virtual visit, you consent to the provision of healthcare and authorize for your insurance to be billed (if applicable) for the services provided during this visit. Depending on your insurance coverage, you may receive a charge related to this service.  I need to obtain your verbal consent now. Are you willing to proceed with your visit today? [name] has provided verbal consent on 02/03/2023 for a virtual visit (video or telephone). Kayla Loveless, PA-C  Date: 02/03/2023 7:15 PM  Virtual Visit via Video Note   IMargaretann [name], connected with  [name]  ([id number], 12-03-1970) on 02/03/23 at  7:00 PM EST by a video-enabled telemedicine application and verified that I am speaking with the correct person using two identifiers.  Location: Patient: Virtual Visit Location  Patient: Home Provider: Virtual Visit Location Provider: Home Office   I discussed the limitations of evaluation and management by telemedicine and the availability of in person appointments. The patient expressed understanding and agreed to proceed.    History of Present Illness: [name] is a 52 y.o. who identifies as a female who was assigned female at birth, and is being seen today for sinus congestion and pain.  HPI: Sinusitis This is a new problem. The current episode started more than 1 month ago (5 weeks). The problem has been gradually worsening since onset. There has been no fever. The pain is moderate. Associated symptoms include congestion, coughing, ear pain (left ear), headaches (left frontal), sinus pressure (left side is worse) and a sore throat (from drainage). Pertinent negatives include no chills, hoarse voice or shortness of breath. Treatments tried: benadryl, sudafed, Dayquil, Nyquil, Robitussin, Advil cold and sinus. The treatment provided mild (no more than 2 hours of relief) relief.     Problems:  Patient Active Problem List   Diagnosis Date Noted   History of obesity 04/21/2022   Vitamin D deficiency 03/29/2021   Tinnitus of both ears 03/29/2021   Hot flashes 01/14/2021   Dysphagia    Stricture and stenosis of esophagus    Palpitations 08/12/2018   Hyperlipidemia, mixed 08/02/2018   Precordial pain 08/02/2018   Family history of ovarian cancer 03/12/2016   Vitamin B12 deficiency (non anemic) 11/18/2014   Constipation    Asthma    Allergic rhinitis  Depression    Anxiety    Hematuria    Insomnia    GERD without esophagitis     Allergies:  Allergies  Allergen Reactions   Penicillins Anaphylaxis   Sulfa Antibiotics Other (See Comments)    Bad taste in mouth   Medications:  Current Outpatient Medications:    azithromycin (ZITHROMAX) 250 MG tablet, Take 2 tablets on day 1, then 1 tablet daily on days 2 through 5, Disp: 6 tablet, Rfl: 0    predniSONE (DELTASONE) 20 MG tablet, Take 2 tablets (40 mg total) by mouth daily with breakfast., Disp: 10 tablet, Rfl: 0   albuterol (VENTOLIN HFA) 108 (90 Base) MCG/ACT inhaler, Inhale 1-2 puffs into the lungs every 4 (four) hours as needed for wheezing or shortness of breath., Disp: 18 g, Rfl: 6   buPROPion (WELLBUTRIN XL) 300 MG 24 hr tablet, TAKE 1 TABLET DAILY, Disp: 90 tablet, Rfl: 1   cetirizine (ZYRTEC) 10 MG tablet, Take 10 mg by mouth daily., Disp: , Rfl:    cyclobenzaprine (FLEXERIL) 10 MG tablet, Take 1 tablet (10 mg total) by mouth 3 (three) times daily as needed for muscle spasms., Disp: 30 tablet, Rfl: 1   EPINEPHrine (EPIPEN 2-PAK) 0.3 mg/0.3 mL IJ SOAJ injection, Inject 0.3 mLs (0.3 mg total) into the muscle as needed for anaphylaxis., Disp: 1 each, Rfl: 1   escitalopram (LEXAPRO) 20 MG tablet, Take 1 tablet (20 mg total) by mouth daily., Disp: 90 tablet, Rfl: 1   estradiol (ESTRACE) 0.5 MG tablet, Take 1 tablet (0.5 mg total) by mouth daily., Disp: 90 tablet, Rfl: 2   Insulin Syringe-Needle U-100 (INSULIN SYRINGE 1CC/31GX5/16") 31G X 5/16" 1 ML MISC, USE FOR ALLERGIES TWO TIMES A WEEK, Disp: 100 each, Rfl: 5   LORazepam (ATIVAN) 0.5 MG tablet, Take 1 tablet (0.5 mg total) by mouth daily as needed for anxiety., Disp: 30 tablet, Rfl: 0   montelukast (SINGULAIR) 10 MG tablet, TAKE 1 TABLET(10 MG) BY MOUTH DAILY, Disp: 90 tablet, Rfl: 1   naproxen (NAPROSYN) 500 MG tablet, TAKE 1 TABLET(500 MG) BY MOUTH TWICE DAILY WITH A MEAL, Disp: 180 tablet, Rfl: 1   omeprazole (PRILOSEC) 40 MG capsule, TAKE 1 CAPSULE IN THE MORNING AND AT BEDTIME, Disp: 180 capsule, Rfl: 2   ondansetron (ZOFRAN) 4 MG tablet, Take 1 tablet (4 mg total) by mouth every 8 (eight) hours as needed for nausea or vomiting., Disp: 60 tablet, Rfl: 1   roflumilast (DALIRESP) 500 MCG TABS tablet, TAKE 1 TABLET BY MOUTH DAILY, Disp: 90 tablet, Rfl: 3   Semaglutide-Weight Management (WEGOVY) 1.7 MG/0.75ML SOAJ, Inject 1.7 mg  into the skin once a week., Disp: 9 mL, Rfl: 1   triamcinolone ointment (KENALOG) 0.1 %, APPLY TO THE AFFECTED AREA LEG, ARM, AND TRUNK TWICE DAILY. AVOID USE ON FACE, Disp: , Rfl:    Vitamin D, Ergocalciferol, (DRISDOL) 1.25 MG (50000 UNIT) CAPS capsule, TAKE 1 CAPSULE BY MOUTH ONE TIME PER WEEK, Disp: 6 capsule, Rfl: 0   zolpidem (AMBIEN) 10 MG tablet, Take 1 tab po daily at bedtime as needed, Disp: 90 tablet, Rfl: 1  Observations/Objective: Patient is well-developed, well-nourished in no acute distress.  Resting comfortably at home.  Head is normocephalic, atraumatic.  No labored breathing.  Speech is clear and coherent with logical content.  Patient is alert and oriented at baseline.    Assessment and Plan: 1. Acute bacterial sinusitis - azithromycin (ZITHROMAX) 250 MG tablet; Take 2 tablets on day 1, then 1 tablet  daily on days 2 through 5  Dispense: 6 tablet; Refill: 0 - predniSONE (DELTASONE) 20 MG tablet; Take 2 tablets (40 mg total) by mouth daily with breakfast.  Dispense: 10 tablet; Refill: 0  - Worsening symptoms that have not responded to OTC medications.  - Will give Azithromycin and Prednisone - Continue allergy medications.  - Steam and humidifier can help - Stay well hydrated and get plenty of rest.  - Seek in person evaluation if no symptom improvement or if symptoms worsen   Follow Up Instructions: I discussed the assessment and treatment plan with the patient. The patient was provided an opportunity to ask questions and all were answered. The patient agreed with the plan and demonstrated an understanding of the instructions.  A copy of instructions were sent to the patient via MyChart unless otherwise noted below.    The patient was advised to call back or seek an in-person evaluation if the symptoms worsen or if the condition fails to improve as anticipated.    Kayla Loveless, PA-C

## 2023-02-03 NOTE — Patient Instructions (Signed)
[name], thank you for joining [name], PA-C for today's virtual visit.  While this provider is not your primary care provider (PCP), if your PCP is located in our provider database this encounter information will be shared with them immediately following your visit.   A [facility] MyChart account gives you access to today's visit and all your visits, tests, and labs performed at [facility] " click here if you don't have a [facility] MyChart account or go to [email or web address]  Consent: (Patient) [name] provided verbal consent for this virtual visit at the beginning of the encounter.  Current Medications:  Current Outpatient Medications:    azithromycin (ZITHROMAX) 250 MG tablet, Take 2 tablets on day 1, then 1 tablet daily on days 2 through 5, Disp: 6 tablet, Rfl: 0   predniSONE (DELTASONE) 20 MG tablet, Take 2 tablets (40 mg total) by mouth daily with breakfast., Disp: 10 tablet, Rfl: 0   albuterol (VENTOLIN HFA) 108 (90 Base) MCG/ACT inhaler, Inhale 1-2 puffs into the lungs every 4 (four) hours as needed for wheezing or shortness of breath., Disp: 18 g, Rfl: 6   buPROPion (WELLBUTRIN XL) 300 MG 24 hr tablet, TAKE 1 TABLET DAILY, Disp: 90 tablet, Rfl: 1   cetirizine (ZYRTEC) 10 MG tablet, Take 10 mg by mouth daily., Disp: , Rfl:    cyclobenzaprine (FLEXERIL) 10 MG tablet, Take 1 tablet (10 mg total) by mouth 3 (three) times daily as needed for muscle spasms., Disp: 30 tablet, Rfl: 1   EPINEPHrine (EPIPEN 2-PAK) 0.3 mg/0.3 mL IJ SOAJ injection, Inject 0.3 mLs (0.3 mg total) into the muscle as needed for anaphylaxis., Disp: 1 each, Rfl: 1   escitalopram (LEXAPRO) 20 MG tablet, Take 1 tablet (20 mg total) by mouth daily., Disp: 90 tablet, Rfl: 1   estradiol (ESTRACE) 0.5 MG tablet, Take 1 tablet (0.5 mg total) by mouth daily., Disp: 90 tablet, Rfl: 2   Insulin Syringe-Needle U-100 (INSULIN SYRINGE 1CC/31GX5/16") 31G X 5/16" 1 ML MISC, USE FOR  ALLERGIES TWO TIMES A WEEK, Disp: 100 each, Rfl: 5   LORazepam (ATIVAN) 0.5 MG tablet, Take 1 tablet (0.5 mg total) by mouth daily as needed for anxiety., Disp: 30 tablet, Rfl: 0   montelukast (SINGULAIR) 10 MG tablet, TAKE 1 TABLET(10 MG) BY MOUTH DAILY, Disp: 90 tablet, Rfl: 1   naproxen (NAPROSYN) 500 MG tablet, TAKE 1 TABLET(500 MG) BY MOUTH TWICE DAILY WITH A MEAL, Disp: 180 tablet, Rfl: 1   omeprazole (PRILOSEC) 40 MG capsule, TAKE 1 CAPSULE IN THE MORNING AND AT BEDTIME, Disp: 180 capsule, Rfl: 2   ondansetron (ZOFRAN) 4 MG tablet, Take 1 tablet (4 mg total) by mouth every 8 (eight) hours as needed for nausea or vomiting., Disp: 60 tablet, Rfl: 1   roflumilast (DALIRESP) 500 MCG TABS tablet, TAKE 1 TABLET BY MOUTH DAILY, Disp: 90 tablet, Rfl: 3   Semaglutide-Weight Management (WEGOVY) 1.7 MG/0.75ML SOAJ, Inject 1.7 mg into the skin once a week., Disp: 9 mL, Rfl: 1   triamcinolone ointment (KENALOG) 0.1 %, APPLY TO THE AFFECTED AREA LEG, ARM, AND TRUNK TWICE DAILY. AVOID USE ON FACE, Disp: , Rfl:    Vitamin D, Ergocalciferol, (DRISDOL) 1.25 MG (50000 UNIT) CAPS capsule, TAKE 1 CAPSULE BY MOUTH ONE TIME PER WEEK, Disp: 6 capsule, Rfl: 0   zolpidem (AMBIEN) 10 MG tablet, Take 1 tab po daily at bedtime as needed, Disp: 90 tablet, Rfl: 1   Medications ordered in this encounter:  Meds ordered this encounter  Medications   azithromycin (ZITHROMAX) 250 MG tablet    Sig: Take 2 tablets on day 1, then 1 tablet daily on days 2 through 5    Dispense:  6 tablet    Refill:  0    Order Specific Question:   Supervising Provider    Answer:   Merrilee Jansky [[id number]]   predniSONE (DELTASONE) 20 MG tablet    Sig: Take 2 tablets (40 mg total) by mouth daily with breakfast.    Dispense:  10 tablet    Refill:  0    Order Specific Question:   Supervising Provider    Answer:   Merrilee Jansky [id number]     *If you need refills on other medications prior to your next appointment, please contact your  pharmacy*  Follow-Up: Call back or seek an in-person evaluation if the symptoms worsen or if the condition fails to improve as anticipated.  [facility] [phone]  Other Instructions Sinus Infection, Adult A sinus infection, also called sinusitis, is inflammation of your sinuses. Sinuses are hollow spaces in the bones around your face. Your sinuses are located: Around your eyes. In the middle of your forehead. Behind your nose. In your cheekbones. Mucus normally drains out of your sinuses. When your nasal tissues become inflamed or swollen, mucus can become trapped or blocked. This allows bacteria, viruses, and fungi to grow, which leads to infection. Most infections of the sinuses are caused by a virus. A sinus infection can develop quickly. It can last for up to 4 weeks (acute) or for more than 12 weeks (chronic). A sinus infection often develops after a cold. What are the causes? This condition is caused by anything that creates swelling in the sinuses or stops mucus from draining. This includes: Allergies. Asthma. Infection from bacteria or viruses. Deformities or blockages in your nose or sinuses. Abnormal growths in the nose (nasal polyps). Pollutants, such as chemicals or irritants in the air. Infection from fungi. This is rare. What increases the risk? You are more likely to develop this condition if you: Have a weak body defense system (immune system). Do a lot of swimming or diving. Overuse nasal sprays. Smoke. What are the signs or symptoms? The main symptoms of this condition are pain and a feeling of pressure around the affected sinuses. Other symptoms include: Stuffy nose or congestion that makes it difficult to breathe through your nose. Thick yellow or greenish drainage from your nose. Tenderness, swelling, and warmth over the affected sinuses. A cough that may get worse at night. Decreased sense of smell and taste. Extra mucus that collects in  the throat or the back of the nose (postnasal drip) causing a sore throat or bad breath. Tiredness (fatigue). Fever. How is this diagnosed? This condition is diagnosed based on: Your symptoms. Your medical history. A physical exam. Tests to find out if your condition is acute or chronic. This may include: Checking your nose for nasal polyps. Viewing your sinuses using a device that has a light (endoscope). Testing for allergies or bacteria. Imaging tests, such as an MRI or CT scan. In rare cases, a bone biopsy may be done to rule out more serious types of fungal sinus disease. How is this treated? Treatment for a sinus infection depends on the cause and whether your condition is chronic or acute. If caused by a virus, your symptoms should go away on their own within 10 days. You may be given  medicines to relieve symptoms. They include: Medicines that shrink swollen nasal passages (decongestants). A spray that eases inflammation of the nostrils (topical intranasal corticosteroids). Rinses that help get rid of thick mucus in your nose (nasal saline washes). Medicines that treat allergies (antihistamines). Over-the-counter pain relievers. If caused by bacteria, your health care provider may recommend waiting to see if your symptoms improve. Most bacterial infections will get better without antibiotic medicine. You may be given antibiotics if you have: A severe infection. A weak immune system. If caused by narrow nasal passages or nasal polyps, surgery may be needed. Follow these instructions at home: Medicines Take, use, or apply over-the-counter and prescription medicines only as told by your health care provider. These may include nasal sprays. If you were prescribed an antibiotic medicine, take it as told by your health care provider. Do not stop taking the antibiotic even if you start to feel better. Hydrate and humidify  Drink enough fluid to keep your urine pale yellow. Staying  hydrated will help to thin your mucus. Use a cool mist humidifier to keep the humidity level in your home above 50%. Inhale steam for 10-15 minutes, 3-4 times a day, or as told by your health care provider. You can do this in the bathroom while a hot shower is running. Limit your exposure to cool or dry air. Rest Rest as much as possible. Sleep with your head raised (elevated). Make sure you get enough sleep each night. General instructions  Apply a warm, moist washcloth to your face 3-4 times a day or as told by your health care provider. This will help with discomfort. Use nasal saline washes as often as told by your health care provider. Wash your hands often with soap and water to reduce your exposure to germs. If soap and water are not available, use hand sanitizer. Do not smoke. Avoid being around people who are smoking (secondhand smoke). Keep all follow-up visits. This is important. Contact a health care provider if: You have a fever. Your symptoms get worse. Your symptoms do not improve within 10 days. Get help right away if: You have a severe headache. You have persistent vomiting. You have severe pain or swelling around your face or eyes. You have vision problems. You develop confusion. Your neck is stiff. You have trouble breathing. These symptoms may be an emergency. Get help right away. Call 911. Do not wait to see if the symptoms will go away. Do not drive yourself to the hospital. Summary A sinus infection is soreness and inflammation of your sinuses. Sinuses are hollow spaces in the bones around your face. This condition is caused by nasal tissues that become inflamed or swollen. The swelling traps or blocks the flow of mucus. This allows bacteria, viruses, and fungi to grow, which leads to infection. If you were prescribed an antibiotic medicine, take it as told by your health care provider. Do not stop taking the antibiotic even if you start to feel better. Keep  all follow-up visits. This is important. This information is not intended to replace advice given to you by your health care provider. Make sure you discuss any questions you have with your health care provider. Document Revised: 01/15/2021 Document Reviewed: 01/15/2021 Elsevier Patient Education  2024 Elsevier Inc.    If you have been instructed to have an in-person evaluation today at a local Urgent Care facility, please use the link below. It will take you to a list of all of our available [facility], including address, phone number and hours of operation. Please do not delay care.  [facility]  If you or a family member do not have a primary care provider, use the link below to schedule a visit and establish care. When you choose a [facility] primary care physician or advanced practice provider, you gain a long-term partner in health. Find a Primary Care Provider  Learn more about [facility]'s in-office and virtual care options: [facility] - Get Care Now

## 2023-02-22 ENCOUNTER — Other Ambulatory Visit: Payer: Self-pay | Admitting: Family Medicine

## 2023-02-26 NOTE — Telephone Encounter (Signed)
 Requested medication (s) are due for refill today:   Provider to review  Requested medication (s) are on the active medication list:   Yes  Future visit scheduled:   Yes 03/17/2023   Last ordered: 01/30/2023 #6, 0 refills  Non delegated refill.    Requesting this too soon also.     Requested Prescriptions  Pending Prescriptions Disp Refills   Vitamin D , Ergocalciferol , (DRISDOL ) 1.25 MG (50000 UNIT) CAPS capsule [Pharmacy Med Name: VITAMIN D2 1.25MG (50,000 UNIT)] 4 capsule 1    Sig: TAKE 1 CAPSULE BY MOUTH ONE TIME PER WEEK     Endocrinology:  Vitamins - Vitamin D  Supplementation 2 Failed - 02/26/2023  2:15 PM      Failed - Manual Review: Route requests for 50,000 IU strength to the provider      Passed - Ca in normal range and within 360 days    Calcium  Date Value Ref Range Status  11/10/2022 9.4 8.7 - 10.2 mg/dL Final   Calcium, Total  Date Value Ref Range Status  03/04/2012 9.5 8.5 - 10.1 mg/dL Final         Passed - Vitamin D  in normal range and within 360 days    Vit D, 25-Hydroxy  Date Value Ref Range Status  11/10/2022 63.1 30.0 - 100.0 ng/mL Final    Comment:    Vitamin D  deficiency has been defined by the Institute of Medicine and an Endocrine Society practice guideline as a level of serum 25-OH vitamin D  less than 20 ng/mL (1,2). The Endocrine Society went on to further define vitamin D  insufficiency as a level between 21 and 29 ng/mL (2). 1. IOM (Institute of Medicine). 2010. Dietary reference    intakes for calcium and D. [place]: The    [employer]. 2. Holick MF, [place], Bischoff-Ferrari HA, et al.    Evaluation, treatment, and prevention of vitamin D     deficiency: an Endocrine Society clinical practice    guideline. JCEM. 2011 Jul; 96(7):1911-30.          Passed - Valid encounter within last 12 months    Recent Outpatient Visits           3 months ago Routine general medical examination at a health care facility   [name], DO   8 months ago Herpes zoster without complication   [facility] [place], [name], DO   10 months ago Hot flashes   [facility] [facility] [place], [place], DO   1 year ago Neoplasm of uncertain behavior of skin   [facility] [place], [name], DO   1 year ago Routine general medical examination at a health care facility   [facility] Vicci Duwaine SQUIBB, DO       Future Appointments             In 2 weeks Vicci Duwaine SQUIBB, DO [facility] [facility]

## 2023-03-05 ENCOUNTER — Other Ambulatory Visit: Payer: Self-pay | Admitting: Family Medicine

## 2023-03-05 NOTE — Telephone Encounter (Signed)
 Medication Refill -  Most Recent Primary Care Visit:  Provider: VICCI BOUCHARD P  Department: [facility]  Visit Type: PHYSICAL  Date: 11/10/2022  Medication:  Vitamin D , Ergocalciferol , (DRISDOL ) 1.25 MG (50000 UNIT) CAPS capsule   Has the patient contacted their pharmacy? Yes (Agent: If no, request that the patient contact the pharmacy for the refill. If patient does not wish to contact the pharmacy document the reason why and proceed with request.) (Agent: If yes, when and what did the pharmacy advise?)  Is this the correct pharmacy for this prescription? Yes [id number] [place], [place] - [address] [place] [address] Phone: [phone] Fax: [phone]   Has the prescription been filled recently? No  Is the patient out of the medication? Yes  Has the patient been seen for an appointment in the last year OR does the patient have an upcoming appointment? Yes  Can we respond through MyChart? Yes  Pt called it in 12/29.    Very upset it has not been filled

## 2023-03-07 ENCOUNTER — Other Ambulatory Visit: Payer: Self-pay | Admitting: Family Medicine

## 2023-03-09 ENCOUNTER — Encounter: Payer: Self-pay | Admitting: Family Medicine

## 2023-03-09 NOTE — Telephone Encounter (Signed)
 Please see message below

## 2023-03-09 NOTE — Telephone Encounter (Signed)
 Last labs were well within normal range- does not need big supplement

## 2023-03-09 NOTE — Telephone Encounter (Signed)
 Requested medication (s) are due for refill today: yes  Requested medication (s) are on the active medication list: yes  Last refill:  01/30/23 #6/0 ends this week   Future visit scheduled: yes  Notes to clinic:  Unable to refill per protocol, cannot delegate. Pt called it in 12/29. Very upset it has not been filled      Requested Prescriptions  Pending Prescriptions Disp Refills   Vitamin D , Ergocalciferol , (DRISDOL ) 1.25 MG (50000 UNIT) CAPS capsule 6 capsule 0     Endocrinology:  Vitamins - Vitamin D  Supplementation 2 Failed - 03/09/2023 10:42 AM      Failed - Manual Review: Route requests for 50,000 IU strength to the provider      Passed - Ca in normal range and within 360 days    Calcium  Date Value Ref Range Status  11/10/2022 9.4 8.7 - 10.2 mg/dL Final   Calcium, Total  Date Value Ref Range Status  03/04/2012 9.5 8.5 - 10.1 mg/dL Final         Passed - Vitamin D  in normal range and within 360 days    Vit D, 25-Hydroxy  Date Value Ref Range Status  11/10/2022 63.1 30.0 - 100.0 ng/mL Final    Comment:    Vitamin D  deficiency has been defined by the Institute of Medicine and an Endocrine Society practice guideline as a level of serum 25-OH vitamin D  less than 20 ng/mL (1,2). The Endocrine Society went on to further define vitamin D  insufficiency as a level between 21 and 29 ng/mL (2). 1. IOM (Institute of Medicine). 2010. Dietary reference    intakes for calcium and D. [place]: The    [employer]. 2. Holick MF, Binkley [place], Bischoff-Ferrari HA, et al.    Evaluation, treatment, and prevention of vitamin D     deficiency: an Endocrine Society clinical practice    guideline. JCEM. 2011 Jul; 96(7):1911-30.          Passed - Valid encounter within last 12 months    Recent Outpatient Visits           3 months ago Routine general medical examination at a health care facility   [facility] [name], DO   8 months  ago Herpes zoster without complication   [place] [facility] [place], [name], DO   10 months ago Hot flashes   [place] [facility] [place], [place], DO   1 year ago Neoplasm of uncertain behavior of skin   [place] [facility] [place], [name], DO   1 year ago Routine general medical examination at a health care facility   [facility] Vicci Duwaine SQUIBB, DO       Future Appointments             In 1 week Vicci Duwaine SQUIBB, DO [facility]

## 2023-03-13 ENCOUNTER — Encounter: Payer: Self-pay | Admitting: Pediatrics

## 2023-03-13 ENCOUNTER — Ambulatory Visit (INDEPENDENT_AMBULATORY_CARE_PROVIDER_SITE_OTHER): Payer: Managed Care, Other (non HMO) | Admitting: Pediatrics

## 2023-03-13 VITALS — BP 97/68 | HR 79 | Temp 98.3°F | Ht 67.0 in | Wt 156.8 lb

## 2023-03-13 DIAGNOSIS — M545 Low back pain, unspecified: Secondary | ICD-10-CM | POA: Diagnosis not present

## 2023-03-13 DIAGNOSIS — Z1330 Encounter for screening examination for mental health and behavioral disorders, unspecified: Secondary | ICD-10-CM

## 2023-03-13 MED ORDER — MELOXICAM 15 MG PO TABS: 15.0000 mg | ORAL_TABLET | Freq: Every day | ORAL | 0 refills | Status: DC

## 2023-03-13 NOTE — Patient Instructions (Addendum)
Tylenol 1000mg  three times daily Stop ibuprofen  Use voltaren Meloxicam daily for next 7 days

## 2023-03-13 NOTE — Progress Notes (Signed)
Office Visit  BP 97/68 (BP Location: Left Arm, Patient Position: Sitting, Cuff Size: Large)   Pulse 79   Temp 98.3 F (36.8 C) (Oral)   Ht 5\' 7"  (1.702 m)   Wt 156 lb 12.8 oz (71.1 kg)   SpO2 97%   BMI 24.56 kg/m    Subjective:    Patient ID: [name], female    DOB: 1970/10/22, 53 y.o.   MRN: [id number]  HPI: [name] is a 53 y.o. female  Chief Complaint  Patient presents with   Hip Pain    Every movement triggers pain, starting to become unbearable, prior treatment has been flexeril, hot soaks, heat, creams, etc. Doesn't know where the pain originated from     Discussed the use of AI scribe software for clinical note transcription with the patient, who gave verbal consent to proceed.  History of Present Illness   The patient, with an unspecified medical history, presented with a localized, palpable "knot" in the left lower back region. The discomfort, described as a sensation of swelling, began approximately a week ago and has been persistent since. The patient denies any recent trauma, falls, or similar episodes in the past. The pain is severe enough to disrupt sleep and is exacerbated by movement.  The patient has attempted self-management with over-the-counter heat creams, cyclobenzaprine, and 800mg  of ibuprofen every four to six hours, all of which have provided no relief. There is no associated numbness radiating down the leg or any urinary symptoms. The patient has a history of a similar muscle knot in the upper back region, which was successfully treated with a cortisone injection years ago.  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 There is no mention of any recent changes in physical activity or exercise habits. The patient has an upcoming physical examination scheduled with her primary care provider.     Relevant past medical, surgical, family and social history reviewed and updated as indicated. Interim medical  history since our last visit reviewed. Allergies and medications reviewed and updated.  ROS per HPI unless specifically indicated above     Objective:    BP 97/68 (BP Location: Left Arm, Patient Position: Sitting, Cuff Size: Large)   Pulse 79   Temp 98.3 F (36.8 C) (Oral)   Ht 5\' 7"  (1.702 m)   Wt 156 lb 12.8 oz (71.1 kg)   SpO2 97%   BMI 24.56 kg/m   Wt Readings from Last 3 Encounters:  03/13/23 156 lb 12.8 oz (71.1 kg)  11/10/22 166 lb 6.4 oz (75.5 kg)  10/17/22 170 lb (77.1 kg)     Physical Exam Constitutional:      Appearance: Normal appearance.  Pulmonary:     Effort: Pulmonary effort is normal.  Musculoskeletal:        General: Normal range of motion.     Thoracic back: Normal.     Lumbar back: Tenderness present. Negative right straight leg raise test and negative left straight leg raise test.     Comments: Muscular not noted near SI joint, lateralizing on the left  Skin:    Comments: Normal skin color  Neurological:     General: No focal deficit present.     Mental Status: She is alert. Mental status is at baseline.  Psychiatric:        Mood and Affect: Mood normal.        Behavior: Behavior normal.  Thought Content: Thought content normal.         03/13/2023    1:39 PM 11/10/2022    8:42 AM 06/30/2022    2:08 PM 04/21/2022    1:29 PM 10/17/2021    1:23 PM  Depression screen PHQ 2/9  Decreased Interest 0 1 0 0 0  Down, Depressed, Hopeless 0 0 0 0 0  PHQ - 2 Score 0 1 0 0 0  Altered sleeping 1 2 1 1  0  Tired, decreased energy 1 2 1 1  0  Change in appetite 0 0 0 0 0  Feeling bad or failure about yourself  0 0 0 0 0  Trouble concentrating 0 0 0 0 0  Moving slowly or fidgety/restless 0 0 0 0 0  Suicidal thoughts 0 0 0 0 0  PHQ-9 Score 2 5 2 2  0  Difficult doing work/chores  Somewhat difficult Not difficult at all Not difficult at all Not difficult at all       03/13/2023    1:39 PM 11/10/2022    8:42 AM 06/30/2022    2:08 PM 04/21/2022    1:30  PM  GAD 7 : Generalized Anxiety Score  Nervous, Anxious, on Edge 0 0 1 0  Control/stop worrying 1 1 1  0  Worry too much - different things 0 1 1 1   Trouble relaxing 0 0 1 0  Restless 0 0 0 0  Easily annoyed or irritable 0 0 0 0  Afraid - awful might happen 0 0 1 1  Total GAD 7 Score 1 2 5 2   Anxiety Difficulty  Somewhat difficult Not difficult at all Not difficult at all       Assessment & Plan:  Assessment & Plan   Acute left-sided low back pain without sciatica Localized left-sided back pain at the junction of the hip bone and spinal column, described as a "knot". No history of trauma or similar episodes. Pain is severe, not relieved by ibuprofen 800mg  every 4-6 hours or cyclobenzaprine. No associated numbness or urinary symptoms. Likely muscular in origin. -Discontinue ibuprofen and cyclobenzaprine if not effective. -Start Meloxicam 15mg  daily for 7 days. -Continue Voltaren gel application. -Add Tylenol up to 1000mg  three times a day. -Consider imaging if no improvement or worsening symptoms after 7 days. -Advise rest and to start stretches once pain is manageable. -Follow-up appointment with Dr. Laural Benes next Tuesday.  -     Meloxicam; Take 1 tablet (15 mg total) by mouth daily.  Dispense: 7 tablet; Refill: 0  Encounter for behavioral health screening As part of their intake evaluation, the patient was screened for depression, anxiety.  PHQ9 SCORE 2, GAD7 SCORE 1. Screening results negative for tested conditions. CTM.   Follow up plan: Return if symptoms worsen or fail to improve.  Anayansi Rundquist Howell Pringle, MD  Approximately 30 minutes spent on patient encounter today including assessment, counseling, diagnosing, treatment plan development, and charting.

## 2023-03-17 ENCOUNTER — Ambulatory Visit (INDEPENDENT_AMBULATORY_CARE_PROVIDER_SITE_OTHER): Payer: Managed Care, Other (non HMO) | Admitting: Family Medicine

## 2023-03-17 ENCOUNTER — Encounter: Payer: Self-pay | Admitting: Family Medicine

## 2023-03-17 VITALS — BP 102/68 | HR 73 | Temp 97.8°F | Wt 156.6 lb

## 2023-03-17 DIAGNOSIS — Z8639 Personal history of other endocrine, nutritional and metabolic disease: Secondary | ICD-10-CM | POA: Diagnosis not present

## 2023-03-17 MED ORDER — BUPROPION HCL ER (XL) 300 MG PO TB24: ORAL_TABLET | ORAL | 0 refills | Status: DC

## 2023-03-17 MED ORDER — WEGOVY 1 MG/0.5ML ~~LOC~~ SOAJ: 1.0000 mg | SUBCUTANEOUS | 1 refills | Status: DC

## 2023-03-17 MED ORDER — ESCITALOPRAM OXALATE 20 MG PO TABS: 20.0000 mg | ORAL_TABLET | Freq: Every day | ORAL | 0 refills | Status: DC

## 2023-03-17 NOTE — Progress Notes (Signed)
BP 102/68   Pulse 73   Temp 97.8 F (36.6 C) (Oral)   Wt 156 lb 9.6 oz (71 kg)   SpO2 99%   BMI 24.53 kg/m    Subjective:    Patient ID: [name], female    DOB: 06/11/1970, 53 y.o.   MRN: [id number]  HPI: [name] is a 53 y.o. female  Chief Complaint  Patient presents with   History of Obesity   History of Obesity Duration: chronic Previous attempts at weight loss: yes Complications of obesity: depression, GERD, HLD Peak weight: 192 Weight loss goal: to maintain  Weight loss to date: 45lbs Requesting obesity pharmacotherapy: yes Current weight loss supplements/medications: yes Previous weight loss supplements/meds: no  Relevant past medical, surgical, family and social history reviewed and updated as indicated. Interim medical history since our last visit reviewed. Allergies and medications reviewed and updated.  Review of Systems  Constitutional: Negative.   Respiratory: Negative.    Cardiovascular: Negative.   Musculoskeletal:  Positive for back pain and myalgias. Negative for arthralgias, gait problem, joint swelling, neck pain and neck stiffness.  Skin: Negative.   Psychiatric/Behavioral: Negative.      Per HPI unless specifically indicated above     Objective:    BP 102/68   Pulse 73   Temp 97.8 F (36.6 C) (Oral)   Wt 156 lb 9.6 oz (71 kg)   SpO2 99%   BMI 24.53 kg/m   Wt Readings from Last 3 Encounters:  03/17/23 156 lb 9.6 oz (71 kg)  03/13/23 156 lb 12.8 oz (71.1 kg)  11/10/22 166 lb 6.4 oz (75.5 kg)    Physical Exam Vitals and nursing note reviewed.  Constitutional:      General: She is not in acute distress.    Appearance: Normal appearance. She is not ill-appearing, toxic-appearing or diaphoretic.  HENT:     Head: Normocephalic and atraumatic.     Right Ear: External ear normal.     Left Ear: External ear normal.     Nose: Nose normal.     Mouth/Throat:     Mouth: Mucous membranes are moist.     Pharynx: Oropharynx is  clear.  Eyes:     General: No scleral icterus.       Right eye: No discharge.        Left eye: No discharge.     Extraocular Movements: Extraocular movements intact.     Conjunctiva/sclera: Conjunctivae normal.     Pupils: Pupils are equal, round, and reactive to light.  Cardiovascular:     Rate and Rhythm: Normal rate and regular rhythm.     Pulses: Normal pulses.     Heart sounds: Normal heart sounds. No murmur heard.    No friction rub. No gallop.  Pulmonary:     Effort: Pulmonary effort is normal. No respiratory distress.     Breath sounds: Normal breath sounds. No stridor. No wheezing, rhonchi or rales.  Chest:     Chest wall: No tenderness.  Musculoskeletal:        General: Normal range of motion.     Cervical back: Normal range of motion and neck supple.  Skin:    General: Skin is warm and dry.     Capillary Refill: Capillary refill takes less than 2 seconds.     Coloration: Skin is not jaundiced or pale.     Findings: No bruising, erythema, lesion or rash.  Neurological:     General: No focal  deficit present.     Mental Status: She is alert and oriented to person, place, and time. Mental status is at baseline.  Psychiatric:        Mood and Affect: Mood normal.        Behavior: Behavior normal.        Thought Content: Thought content normal.        Judgment: Judgment normal.     Results for orders placed or performed in visit on 11/10/22  CBC with Differential/Platelet   Collection Time: 11/10/22  8:59 AM  Result Value Ref Range   WBC 5.5 3.4 - 10.8 x10E3/uL   RBC 4.28 3.77 - 5.28 x10E6/uL   Hemoglobin 13.2 11.1 - 15.9 g/dL   Hematocrit 44.0 10.2 - 46.6 %   MCV 97 79 - 97 fL   MCH 30.8 26.6 - 33.0 pg   MCHC 31.8 31.5 - 35.7 g/dL   RDW 72.5 36.6 - 44.0 %   Platelets 260 150 - 450 x10E3/uL   Neutrophils 40 Not Estab. %   Lymphs 46 Not Estab. %   Monocytes 6 Not Estab. %   Eos 6 Not Estab. %   Basos 2 Not Estab. %   Neutrophils Absolute 2.2 1.4 - 7.0  x10E3/uL   Lymphocytes Absolute 2.6 0.7 - 3.1 x10E3/uL   Monocytes Absolute 0.3 0.1 - 0.9 x10E3/uL   EOS (ABSOLUTE) 0.3 0.0 - 0.4 x10E3/uL   Basophils Absolute 0.1 0.0 - 0.2 x10E3/uL   Immature Granulocytes 0 Not Estab. %   Immature Grans (Abs) 0.0 0.0 - 0.1 x10E3/uL  Comprehensive metabolic panel   Collection Time: 11/10/22  8:59 AM  Result Value Ref Range   Glucose 90 70 - 99 mg/dL   BUN 12 6 - 24 mg/dL   Creatinine, Ser 3.47 0.57 - 1.00 mg/dL   eGFR 90 >42 VZ/DGL/8.75   BUN/Creatinine Ratio 15 9 - 23   Sodium 139 134 - 144 mmol/L   Potassium 4.1 3.5 - 5.2 mmol/L   Chloride 99 96 - 106 mmol/L   CO2 26 20 - 29 mmol/L   Calcium 9.4 8.7 - 10.2 mg/dL   Total Protein 6.6 6.0 - 8.5 g/dL   Albumin 4.6 3.8 - 4.9 g/dL   Globulin, Total 2.0 1.5 - 4.5 g/dL   Bilirubin Total 0.2 0.0 - 1.2 mg/dL   Alkaline Phosphatase 58 44 - 121 IU/L   AST 11 0 - 40 IU/L   ALT 7 0 - 32 IU/L  Lipid Panel w/o Chol/HDL Ratio   Collection Time: 11/10/22  8:59 AM  Result Value Ref Range   Cholesterol, Total 236 (H) 100 - 199 mg/dL   Triglycerides 643 (H) 0 - 149 mg/dL   HDL 66 >32 mg/dL   VLDL Cholesterol Cal 56 (H) 5 - 40 mg/dL   LDL Chol Calc (NIH) 951 (H) 0 - 99 mg/dL  TSH   Collection Time: 11/10/22  8:59 AM  Result Value Ref Range   TSH 3.270 0.450 - 4.500 uIU/mL  VITAMIN D 25 Hydroxy (Vit-D Deficiency, Fractures)   Collection Time: 11/10/22  8:59 AM  Result Value Ref Range   Vit D, 25-Hydroxy 63.1 30.0 - 100.0 ng/mL  Vitamin B12   Collection Time: 11/10/22  8:59 AM  Result Value Ref Range   Vitamin B-12 485 232 - 1,245 pg/mL      Assessment & Plan:   Problem List Items Addressed This Visit       Other   History of  obesity - Primary   Maintaining weight loss very well. Down an additional 10lbs. Will continue slow wegovy wean down to 1mg  and recheck in 3 months. Goal of getting off medicine while maintaining. Call with any concerns. Continue to monitor.         Follow up  plan: Return in about 3 months (around 06/15/2023).

## 2023-03-17 NOTE — Assessment & Plan Note (Signed)
Maintaining weight loss very well. Down an additional 10lbs. Will continue slow wegovy wean down to 1mg  and recheck in 3 months. Goal of getting off medicine while maintaining. Call with any concerns. Continue to monitor.

## 2023-04-13 ENCOUNTER — Telehealth: Payer: Self-pay

## 2023-04-13 ENCOUNTER — Other Ambulatory Visit: Payer: Self-pay | Admitting: Physician Assistant

## 2023-04-13 DIAGNOSIS — F5101 Primary insomnia: Secondary | ICD-10-CM

## 2023-04-13 MED ORDER — ZOLPIDEM TARTRATE 10 MG PO TABS: ORAL_TABLET | ORAL | 0 refills | Status: DC

## 2023-04-14 NOTE — Telephone Encounter (Signed)
Lauren sent Ambien for patient.

## 2023-04-20 ENCOUNTER — Ambulatory Visit: Payer: Managed Care, Other (non HMO) | Admitting: Physician Assistant

## 2023-04-20 ENCOUNTER — Encounter: Payer: Self-pay | Admitting: Physician Assistant

## 2023-04-20 VITALS — BP 110/75 | HR 69 | Temp 98.2°F | Resp 16 | Ht 67.0 in | Wt 157.0 lb

## 2023-04-20 DIAGNOSIS — F5101 Primary insomnia: Secondary | ICD-10-CM

## 2023-04-20 DIAGNOSIS — J4520 Mild intermittent asthma, uncomplicated: Secondary | ICD-10-CM

## 2023-04-20 MED ORDER — ZOLPIDEM TARTRATE 10 MG PO TABS: ORAL_TABLET | ORAL | 0 refills | Status: DC

## 2023-04-20 NOTE — Progress Notes (Signed)
 [facility] [address] [place], [place] [address]  Pulmonary Sleep Medicine   Office Visit Note  Patient Name: [name] DOB: April 21, 1970 MRN [id number]  Date of Service: 04/20/2023  Complaints/HPI: Pt is here for routine pulmonary follow up. Doing well with ambien nightly. No side effects. No breathing concerns. Has not needed albuterol in a long time, but is taking Daliresp and thinks this is really helping. Due for PFT.  ROS  General: (-) fever, (-) chills, (-) night sweats, (-) weakness Skin: (-) rashes, (-) itching,. Eyes: (-) visual changes, (-) redness, (-) itching. Nose and Sinuses: (-) nasal stuffiness or itchiness, (-) postnasal drip, (-) nosebleeds, (-) sinus trouble. Mouth and Throat: (-) sore throat, (-) hoarseness. Neck: (-) swollen glands, (-) enlarged thyroid, (-) neck pain. Respiratory: - cough, (-) bloody sputum, - shortness of breath, - wheezing. Cardiovascular: - ankle swelling, (-) chest pain. Lymphatic: (-) lymph node enlargement. Neurologic: (-) numbness, (-) tingling. Psychiatric: (-) anxiety, (-) depression   Current Medication: Outpatient Encounter Medications as of 04/20/2023  Medication Sig   albuterol (VENTOLIN HFA) 108 (90 Base) MCG/ACT inhaler Inhale 1-2 puffs into the lungs every 4 (four) hours as needed for wheezing or shortness of breath.   buPROPion (WELLBUTRIN XL) 300 MG 24 hr tablet TAKE 1 TABLET DAILY   cetirizine (ZYRTEC) 10 MG tablet Take 10 mg by mouth daily.   cyclobenzaprine (FLEXERIL) 10 MG tablet Take 1 tablet (10 mg total) by mouth 3 (three) times daily as needed for muscle spasms.   EPINEPHrine (EPIPEN 2-PAK) 0.3 mg/0.3 mL IJ SOAJ injection Inject 0.3 mLs (0.3 mg total) into the muscle as needed for anaphylaxis.   escitalopram (LEXAPRO) 20 MG tablet Take 1 tablet (20 mg total) by mouth daily.   estradiol (ESTRACE) 0.5 MG tablet Take 1 tablet (0.5 mg total) by mouth daily.   LORazepam (ATIVAN) 0.5 MG tablet Take 1  tablet (0.5 mg total) by mouth daily as needed for anxiety.   meloxicam (MOBIC) 15 MG tablet Take 1 tablet (15 mg total) by mouth daily.   omeprazole (PRILOSEC) 40 MG capsule TAKE 1 CAPSULE IN THE MORNING AND AT BEDTIME   ondansetron (ZOFRAN) 4 MG tablet Take 1 tablet (4 mg total) by mouth every 8 (eight) hours as needed for nausea or vomiting.   roflumilast (DALIRESP) 500 MCG TABS tablet TAKE 1 TABLET BY MOUTH DAILY   Semaglutide-Weight Management (WEGOVY) 1 MG/0.5ML SOAJ Inject 1 mg into the skin once a week.   triamcinolone ointment (KENALOG) 0.1 % APPLY TO THE AFFECTED AREA LEG, ARM, AND TRUNK TWICE DAILY. AVOID USE ON FACE   [DISCONTINUED] zolpidem (AMBIEN) 10 MG tablet Take 1 tab po daily at bedtime as needed   [START ON 07/08/2023] zolpidem (AMBIEN) 10 MG tablet Take 1 tab po daily at bedtime as needed   [DISCONTINUED] albuterol (VENTOLIN HFA) 108 (90 Base) MCG/ACT inhaler Inhale 1-2 puffs into the lungs every 4 (four) hours as needed for wheezing or shortness of breath.   [DISCONTINUED] buPROPion (WELLBUTRIN XL) 300 MG 24 hr tablet TAKE 1 TABLET BY MOUTH DAILY   [DISCONTINUED] escitalopram (LEXAPRO) 20 MG tablet Take 1 tablet (20 mg total) by mouth daily.   [DISCONTINUED] estradiol (ESTRACE) 0.5 MG tablet TAKE 1 TABLET BY MOUTH DAILY   [DISCONTINUED] Insulin Syringe-Needle U-100 (INSULIN SYRINGE 1CC/31GX5/16") 31G X 5/16" 1 ML MISC USE FOR ALLERGIES TWO TIMES A WEEK   [DISCONTINUED] montelukast (SINGULAIR) 10 MG tablet TAKE 1 TABLET(10 MG) BY MOUTH DAILY   [  DISCONTINUED] naproxen (NAPROSYN) 500 MG tablet TAKE 1 TABLET(500 MG) BY MOUTH TWICE DAILY WITH A MEAL   [DISCONTINUED] omeprazole (PRILOSEC) 40 MG capsule Take 1 capsule (40 mg total) by mouth in the morning and at bedtime. For 30 days.   [DISCONTINUED] roflumilast (DALIRESP) 500 MCG TABS tablet Take 1 tablet (500 mcg total) by mouth daily.   [DISCONTINUED] Semaglutide-Weight Management (WEGOVY) 2.4 MG/0.75ML SOAJ INJECT 2.4 MG UNDER THE  SKIN ONCE A WEEK   [DISCONTINUED] Vitamin D, Ergocalciferol, (DRISDOL) 1.25 MG (50000 UNIT) CAPS capsule TAKE 1 CAPSULE BY MOUTH ONCE A WEEK   [DISCONTINUED] zolpidem (AMBIEN) 10 MG tablet Take 1 tab po daily at bedtime as needed   No facility-administered encounter medications on file as of 04/20/2023.    Surgical History: Past Surgical History:  Procedure Laterality Date   APPENDECTOMY     ESOPHAGOGASTRODUODENOSCOPY (EGD) WITH PROPOFOL N/A 06/14/2019   Procedure: ESOPHAGOGASTRODUODENOSCOPY (EGD) WITH PROPOFOL;  Surgeon: Midge Minium, MD;  Location: [facility] ENDOSCOPY;  Service: Endoscopy;  Laterality: N/A;   OVARIAN CYST DRAINAGE Right 12/25/2001   supracervial hysterectomy     TONSILLECTOMY     TUBAL LIGATION      Medical History: Past Medical History:  Diagnosis Date   Allergic rhinitis    Anxiety    Asthma    Constipation    COPD (chronic obstructive pulmonary disease) (HCC)    Depression    Hematuria    Insomnia    Reflux     Family History: Family History  Problem Relation Age of Onset   Cancer Mother        ovarian   Stroke Father    Cancer Maternal Uncle        lung   Diabetes Maternal Grandmother    Emphysema Maternal Grandfather    Diabetes Paternal Grandmother    Stroke Paternal Grandfather    Breast cancer Neg Hx     Social History: Social History   Socioeconomic History   Marital status: Married    Spouse name: Not on file   Number of children: Not on file   Years of education: Not on file   Highest education level: Not on file  Occupational History   Not on file  Tobacco Use   Smoking status: Never   Smokeless tobacco: Never  Vaping Use   Vaping status: Never Used  Substance and Sexual Activity   Alcohol use: Yes    Comment: occasional   Drug use: No   Sexual activity: Yes  Other Topics Concern   Not on file  Social History Narrative   Not on file   Social Drivers of Health   Financial Resource Strain: Not on file  Food  Insecurity: Not on file  Transportation Needs: Not on file  Physical Activity: Not on file  Stress: Not on file  Social Connections: Not on file  Intimate Partner Violence: Not on file    Vital Signs: Blood pressure 110/75, pulse 69, temperature 98.2 F (36.8 C), resp. rate 16, height 5\' 7"  (1.702 m), weight 157 lb (71.2 kg), SpO2 97%.  Examination: General Appearance: The patient is well-developed, well-nourished, and in no distress. Skin: Gross inspection of skin unremarkable. Head: normocephalic, no gross deformities. Eyes: no gross deformities noted. ENT: ears appear grossly normal no exudates. Neck: Supple. No thyromegaly. No LAD. Respiratory: Lungs clear to auscultation. Cardiovascular: Normal S1 and S2 without murmur or rub. Extremities: No cyanosis. pulses are equal. Neurologic: Alert and oriented. No involuntary movements.  LABS: No  results found for this or any previous visit (from the past 2160 hours).  Radiology: MM 3D SCREEN BREAST BILATERAL Result Date: 07/09/2022 CLINICAL DATA:  Screening. EXAM: DIGITAL SCREENING BILATERAL MAMMOGRAM WITH TOMOSYNTHESIS AND CAD TECHNIQUE: Bilateral screening digital craniocaudal and mediolateral oblique mammograms were obtained. Bilateral screening digital breast tomosynthesis was performed. The images were evaluated with computer-aided detection. COMPARISON:  Previous exam(s). ACR Breast Density Category b: There are scattered areas of fibroglandular density. FINDINGS: There are no findings suspicious for malignancy. IMPRESSION: No mammographic evidence of malignancy. A result letter of this screening mammogram will be mailed directly to the patient. RECOMMENDATION: Screening mammogram in one year. (Code:SM-B-01Y) BI-RADS CATEGORY  1: Negative. Electronically Signed   By: Harmon Pier M.D.   On: 07/09/2022 09:30    No results found.  No results found.    Assessment and Plan: Patient Active Problem List   Diagnosis Date Noted    History of obesity 04/21/2022   Vitamin D deficiency 03/29/2021   Tinnitus of both ears 03/29/2021   Hot flashes 01/14/2021   Dysphagia    Stricture and stenosis of esophagus    Palpitations 08/12/2018   Hyperlipidemia, mixed 08/02/2018   Precordial pain 08/02/2018   Family history of ovarian cancer 03/12/2016   Vitamin B12 deficiency (non anemic) 11/18/2014   Constipation    Asthma    Allergic rhinitis    Depression    Anxiety    Hematuria    Insomnia    GERD without esophagitis    1. Primary insomnia May continue ambien nightly as needed - zolpidem (AMBIEN) 10 MG tablet; Take 1 tab po daily at bedtime as needed  Dispense: 90 tablet; Refill: 0  2. Chronic asthma, mild intermittent, uncomplicated (Primary) Stable, continue current medication and will update PFT - Pulmonary Function Test; Future   General Counseling: I have discussed the findings of the evaluation and examination with [name].  I have also discussed any further diagnostic evaluation thatmay be needed or ordered today. [name] verbalizes understanding of the findings of todays visit. We also reviewed her medications today and discussed drug interactions and side effects including but not limited excessive drowsiness and altered mental states. We also discussed that there is always a risk not just to her but also people around her. she has been encouraged to call the office with any questions or concerns that should arise related to todays visit.  Orders Placed This Encounter  Procedures   Pulmonary Function Test    Standing Status:   Future    Expected Date:   10/18/2023    Expiration Date:   04/19/2024    Where should this test be performed?:   [facility]     Time spent: 30  I have personally obtained a history, examined the patient, evaluated laboratory and imaging results, formulated the assessment and plan and placed orders. This patient was seen by Lynn Ito, PA-C in collaboration with Dr.  Freda Munro as a part of collaborative care agreement.     Yevonne Pax, MD [facility] Pulmonary and Critical Care Sleep medicine

## 2023-05-06 ENCOUNTER — Ambulatory Visit
Admission: RE | Admit: 2023-05-06 | Discharge: 2023-05-06 | Disposition: A | Discharge status: Home / Self Care | Source: Ambulatory Visit | Attending: Physician Assistant | Admitting: Physician Assistant

## 2023-05-06 ENCOUNTER — Encounter: Payer: Self-pay | Admitting: Nurse Practitioner

## 2023-05-06 ENCOUNTER — Other Ambulatory Visit: Payer: Self-pay | Admitting: Physician Assistant

## 2023-05-06 ENCOUNTER — Ambulatory Visit (INDEPENDENT_AMBULATORY_CARE_PROVIDER_SITE_OTHER): Admitting: Nurse Practitioner

## 2023-05-06 VITALS — BP 102/70 | HR 90 | Ht 67.0 in | Wt 155.4 lb

## 2023-05-06 DIAGNOSIS — R2242 Localized swelling, mass and lump, left lower limb: Secondary | ICD-10-CM | POA: Insufficient documentation

## 2023-05-06 DIAGNOSIS — M25562 Pain in left knee: Secondary | ICD-10-CM | POA: Diagnosis not present

## 2023-05-06 NOTE — Progress Notes (Signed)
 BP 102/70 (BP Location: Right Arm, Patient Position: Sitting, Cuff Size: Large)   Pulse 90   Ht 5\' 7"  (1.702 m)   Wt 155 lb 6.4 oz (70.5 kg)   SpO2 96%   BMI 24.34 kg/m    Subjective:    Patient ID: [name], female    DOB: 20-Nov-1970, 53 y.o.   MRN: [id number]  HPI: [name] is a 53 y.o. female  Chief Complaint  Patient presents with   Injury    Left knee, happened a few days ago, elevation, ibuprofen, ice baths, and flexeril were prior treatment    KNEE PAIN Duration:  happened a few days ago but yesterday it started getting worse and started swelling. Involved knee: left Mechanism of injury: unknown Location:diffuse Onset: sudden Severity: 8/10  Quality:  throbbing Frequency: constant Radiation: no Aggravating factors: weight bearing , walking and movement Alleviating factors: nothing  Status: worse Treatments attempted:  elevation, ice, and ibuprofen  Relief with NSAIDs?:  no Weakness with weight bearing or walking: no Sensation of giving way: yes Locking: yes Popping: yes Bruising: no Swelling: yes Redness: no Paresthesias/decreased sensation: no Fevers: no  Relevant past medical, surgical, family and social history reviewed and updated as indicated. Interim medical history since our last visit reviewed. Allergies and medications reviewed and updated.  Review of Systems  Musculoskeletal:        Left knee pain and swelling    Per HPI unless specifically indicated above     Objective:    BP 102/70 (BP Location: Right Arm, Patient Position: Sitting, Cuff Size: Large)   Pulse 90   Ht 5\' 7"  (1.702 m)   Wt 155 lb 6.4 oz (70.5 kg)   SpO2 96%   BMI 24.34 kg/m   Wt Readings from Last 3 Encounters:  05/06/23 155 lb 6.4 oz (70.5 kg)  04/20/23 157 lb (71.2 kg)  03/17/23 156 lb 9.6 oz (71 kg)    Physical Exam Vitals and nursing note reviewed.  Constitutional:      General: She is not in acute distress.    Appearance: Normal appearance. She  is normal weight. She is not ill-appearing, toxic-appearing or diaphoretic.  HENT:     Head: Normocephalic.     Right Ear: External ear normal.     Left Ear: External ear normal.     Nose: Nose normal.     Mouth/Throat:     Mouth: Mucous membranes are moist.     Pharynx: Oropharynx is clear.  Eyes:     General:        Right eye: No discharge.        Left eye: No discharge.     Extraocular Movements: Extraocular movements intact.     Conjunctiva/sclera: Conjunctivae normal.     Pupils: Pupils are equal, round, and reactive to light.  Cardiovascular:     Rate and Rhythm: Normal rate and regular rhythm.     Heart sounds: No murmur heard. Pulmonary:     Effort: Pulmonary effort is normal. No respiratory distress.     Breath sounds: Normal breath sounds. No wheezing or rales.  Musculoskeletal:     Cervical back: Normal range of motion and neck supple.     Left knee: Swelling present. No deformity, effusion, erythema, ecchymosis, lacerations, bony tenderness or crepitus. Decreased range of motion. Tenderness present over the medial joint line and lateral joint line. No MCL, LCL, ACL, PCL or patellar tendon tenderness. No LCL laxity, MCL laxity,  ACL laxity or PCL laxity.Normal alignment, normal meniscus and normal patellar mobility. Normal pulse.  Skin:    General: Skin is warm and dry.     Capillary Refill: Capillary refill takes less than 2 seconds.  Neurological:     General: No focal deficit present.     Mental Status: She is alert and oriented to person, place, and time. Mental status is at baseline.  Psychiatric:        Mood and Affect: Mood normal.        Behavior: Behavior normal.        Thought Content: Thought content normal.        Judgment: Judgment normal.     Results for orders placed or performed in visit on 11/10/22  CBC with Differential/Platelet   Collection Time: 11/10/22  8:59 AM  Result Value Ref Range   WBC 5.5 3.4 - 10.8 x10E3/uL   RBC 4.28 3.77 - 5.28  x10E6/uL   Hemoglobin 13.2 11.1 - 15.9 g/dL   Hematocrit 16.1 09.6 - 46.6 %   MCV 97 79 - 97 fL   MCH 30.8 26.6 - 33.0 pg   MCHC 31.8 31.5 - 35.7 g/dL   RDW 04.5 40.9 - 81.1 %   Platelets 260 150 - 450 x10E3/uL   Neutrophils 40 Not Estab. %   Lymphs 46 Not Estab. %   Monocytes 6 Not Estab. %   Eos 6 Not Estab. %   Basos 2 Not Estab. %   Neutrophils Absolute 2.2 1.4 - 7.0 x10E3/uL   Lymphocytes Absolute 2.6 0.7 - 3.1 x10E3/uL   Monocytes Absolute 0.3 0.1 - 0.9 x10E3/uL   EOS (ABSOLUTE) 0.3 0.0 - 0.4 x10E3/uL   Basophils Absolute 0.1 0.0 - 0.2 x10E3/uL   Immature Granulocytes 0 Not Estab. %   Immature Grans (Abs) 0.0 0.0 - 0.1 x10E3/uL  Comprehensive metabolic panel   Collection Time: 11/10/22  8:59 AM  Result Value Ref Range   Glucose 90 70 - 99 mg/dL   BUN 12 6 - 24 mg/dL   Creatinine, Ser 9.14 0.57 - 1.00 mg/dL   eGFR 90 >78 GN/FAO/1.30   BUN/Creatinine Ratio 15 9 - 23   Sodium 139 134 - 144 mmol/L   Potassium 4.1 3.5 - 5.2 mmol/L   Chloride 99 96 - 106 mmol/L   CO2 26 20 - 29 mmol/L   Calcium 9.4 8.7 - 10.2 mg/dL   Total Protein 6.6 6.0 - 8.5 g/dL   Albumin 4.6 3.8 - 4.9 g/dL   Globulin, Total 2.0 1.5 - 4.5 g/dL   Bilirubin Total 0.2 0.0 - 1.2 mg/dL   Alkaline Phosphatase 58 44 - 121 IU/L   AST 11 0 - 40 IU/L   ALT 7 0 - 32 IU/L  Lipid Panel w/o Chol/HDL Ratio   Collection Time: 11/10/22  8:59 AM  Result Value Ref Range   Cholesterol, Total 236 (H) 100 - 199 mg/dL   Triglycerides 865 (H) 0 - 149 mg/dL   HDL 66 >78 mg/dL   VLDL Cholesterol Cal 56 (H) 5 - 40 mg/dL   LDL Chol Calc (NIH) 469 (H) 0 - 99 mg/dL  TSH   Collection Time: 11/10/22  8:59 AM  Result Value Ref Range   TSH 3.270 0.450 - 4.500 uIU/mL  VITAMIN D 25 Hydroxy (Vit-D Deficiency, Fractures)   Collection Time: 11/10/22  8:59 AM  Result Value Ref Range   Vit D, 25-Hydroxy 63.1 30.0 - 100.0 ng/mL  Vitamin B12  Collection Time: 11/10/22  8:59 AM  Result Value Ref Range   Vitamin B-12 485 232 -  1,245 pg/mL      Assessment & Plan:   Problem List Items Addressed This Visit   None Visit Diagnoses       Acute pain of left knee    -  Primary   Swelling and pain with palpation on exam. Recommend patient make an appt with [facility] for today.  Needs MRI for further evaluation and management.        Follow up plan: Return if symptoms worsen or fail to improve.

## 2023-06-19 ENCOUNTER — Ambulatory Visit (INDEPENDENT_AMBULATORY_CARE_PROVIDER_SITE_OTHER): Payer: Self-pay | Admitting: Family Medicine

## 2023-06-19 ENCOUNTER — Encounter: Payer: Self-pay | Admitting: Family Medicine

## 2023-06-19 VITALS — BP 104/70 | HR 72 | Ht 67.0 in | Wt 155.2 lb

## 2023-06-19 DIAGNOSIS — E559 Vitamin D deficiency, unspecified: Secondary | ICD-10-CM | POA: Diagnosis not present

## 2023-06-19 DIAGNOSIS — E538 Deficiency of other specified B group vitamins: Secondary | ICD-10-CM

## 2023-06-19 DIAGNOSIS — F330 Major depressive disorder, recurrent, mild: Secondary | ICD-10-CM

## 2023-06-19 DIAGNOSIS — F419 Anxiety disorder, unspecified: Secondary | ICD-10-CM

## 2023-06-19 DIAGNOSIS — E782 Mixed hyperlipidemia: Secondary | ICD-10-CM

## 2023-06-19 DIAGNOSIS — Z8639 Personal history of other endocrine, nutritional and metabolic disease: Secondary | ICD-10-CM

## 2023-06-19 MED ORDER — ONDANSETRON HCL 4 MG PO TABS: 4.0000 mg | ORAL_TABLET | Freq: Three times a day (TID) | ORAL | 1 refills | Status: DC | PRN

## 2023-06-19 MED ORDER — ESCITALOPRAM OXALATE 20 MG PO TABS: 20.0000 mg | ORAL_TABLET | Freq: Every day | ORAL | 1 refills | Status: DC

## 2023-06-19 MED ORDER — BUPROPION HCL ER (XL) 300 MG PO TB24: ORAL_TABLET | ORAL | 1 refills | Status: DC

## 2023-06-19 MED ORDER — WEGOVY 0.5 MG/0.5ML ~~LOC~~ SOAJ: 0.5000 mg | SUBCUTANEOUS | 1 refills | Status: DC

## 2023-06-19 MED ORDER — OMEPRAZOLE 40 MG PO CPDR: 40.0000 mg | DELAYED_RELEASE_CAPSULE | Freq: Two times a day (BID) | ORAL | 2 refills | Status: DC

## 2023-06-19 MED ORDER — ESTRADIOL 0.5 MG PO TABS: 0.5000 mg | ORAL_TABLET | Freq: Every day | ORAL | 2 refills | Status: DC

## 2023-06-19 NOTE — Assessment & Plan Note (Signed)
 Rechecking labs today. Await results. Treat as needed.

## 2023-06-19 NOTE — Assessment & Plan Note (Signed)
 Under good control on current regimen. Continue current regimen. Continue to monitor. Call with any concerns. Refills given. Labs drawn today.

## 2023-06-19 NOTE — Progress Notes (Signed)
 BP 104/70 (BP Location: Left Arm, Patient Position: Sitting, Cuff Size: Normal)   Pulse 72   Ht 5\' 7"  (1.702 m)   Wt 155 lb 3.2 oz (70.4 kg)   BMI 24.31 kg/m    Subjective:    Patient ID: [name], female    DOB: [birth year], 53 y.o.   MRN: [id number]  HPI: [name] is a 53 y.o. female  Chief Complaint  Patient presents with   Weight Check   Has been doing great with titrating down on the wegovy . Very few side effects. Has not regained weight. No other concerns.   ANXIETY/DEPRESSION Duration: chronic Status:controlled Anxious mood: no  Excessive worrying: no Irritability: no  Sweating: no Nausea: no Palpitations:no Hyperventilation: no Panic attacks: no Agoraphobia: no  Obscessions/compulsions: no Depressed mood: no    05/06/2023    8:20 AM 03/13/2023    1:39 PM 11/10/2022    8:42 AM 06/30/2022    2:08 PM 04/21/2022    1:29 PM  Depression screen PHQ 2/9  Decreased Interest 0 0 1 0 0  Down, Depressed, Hopeless 0 0 0 0 0  PHQ - 2 Score 0 0 1 0 0  Altered sleeping 0 1 2 1 1   Tired, decreased energy 0 1 2 1 1   Change in appetite 0 0 0 0 0  Feeling bad or failure about yourself  0 0 0 0 0  Trouble concentrating 0 0 0 0 0  Moving slowly or fidgety/restless 0 0 0 0 0  Suicidal thoughts 0 0 0 0 0  PHQ-9 Score 0 2 5 2 2   Difficult doing work/chores   Somewhat difficult Not difficult at all Not difficult at all   Anhedonia: no Weight changes: no Insomnia: no   Hypersomnia: no Fatigue/loss of energy: no Feelings of worthlessness: no Feelings of guilt: no Impaired concentration/indecisiveness: no Suicidal ideations: no  Crying spells: no Recent Stressors/Life Changes: no   Relationship problems: no   Family stress: no     Financial stress: no    Job stress: no    Recent death/loss: no   Relevant past medical, surgical, family and social history reviewed and updated as indicated. Interim medical history since our last visit reviewed. Allergies and  medications reviewed and updated.  Review of Systems  Constitutional: Negative.   Respiratory: Negative.    Cardiovascular: Negative.   Musculoskeletal: Negative.   Neurological: Negative.   Psychiatric/Behavioral: Negative.      Per HPI unless specifically indicated above     Objective:    BP 104/70 (BP Location: Left Arm, Patient Position: Sitting, Cuff Size: Normal)   Pulse 72   Ht 5\' 7"  (1.702 m)   Wt 155 lb 3.2 oz (70.4 kg)   BMI 24.31 kg/m   Wt Readings from Last 3 Encounters:  06/19/23 155 lb 3.2 oz (70.4 kg)  05/06/23 155 lb 6.4 oz (70.5 kg)  04/20/23 157 lb (71.2 kg)    Physical Exam Vitals and nursing note reviewed.  Constitutional:      General: She is not in acute distress.    Appearance: Normal appearance. She is not ill-appearing, toxic-appearing or diaphoretic.  HENT:     Head: Normocephalic and atraumatic.     Right Ear: External ear normal.     Left Ear: External ear normal.     Nose: Nose normal.     Mouth/Throat:     Mouth: Mucous membranes are moist.     Pharynx: Oropharynx is clear.  Eyes:     General: No scleral icterus.       Right eye: No discharge.        Left eye: No discharge.     Extraocular Movements: Extraocular movements intact.     Conjunctiva/sclera: Conjunctivae normal.     Pupils: Pupils are equal, round, and reactive to light.  Cardiovascular:     Rate and Rhythm: Normal rate and regular rhythm.     Pulses: Normal pulses.     Heart sounds: Normal heart sounds. No murmur heard.    No friction rub. No gallop.  Pulmonary:     Effort: Pulmonary effort is normal. No respiratory distress.     Breath sounds: Normal breath sounds. No stridor. No wheezing, rhonchi or rales.  Chest:     Chest wall: No tenderness.  Musculoskeletal:        General: Normal range of motion.     Cervical back: Normal range of motion and neck supple.  Skin:    General: Skin is warm and dry.     Capillary Refill: Capillary refill takes less than 2  seconds.     Coloration: Skin is not jaundiced or pale.     Findings: No bruising, erythema, lesion or rash.  Neurological:     General: No focal deficit present.     Mental Status: She is alert and oriented to person, place, and time. Mental status is at baseline.  Psychiatric:        Mood and Affect: Mood normal.        Behavior: Behavior normal.        Thought Content: Thought content normal.        Judgment: Judgment normal.     Results for orders placed or performed in visit on 11/10/22  CBC with Differential/Platelet   Collection Time: 11/10/22  8:59 AM  Result Value Ref Range   WBC 5.5 3.4 - 10.8 x10E3/uL   RBC 4.28 3.77 - 5.28 x10E6/uL   Hemoglobin 13.2 11.1 - 15.9 g/dL   Hematocrit 81.1 91.4 - 46.6 %   MCV 97 79 - 97 fL   MCH 30.8 26.6 - 33.0 pg   MCHC 31.8 31.5 - 35.7 g/dL   RDW 78.2 95.6 - 21.3 %   Platelets 260 150 - 450 x10E3/uL   Neutrophils 40 Not Estab. %   Lymphs 46 Not Estab. %   Monocytes 6 Not Estab. %   Eos 6 Not Estab. %   Basos 2 Not Estab. %   Neutrophils Absolute 2.2 1.4 - 7.0 x10E3/uL   Lymphocytes Absolute 2.6 0.7 - 3.1 x10E3/uL   Monocytes Absolute 0.3 0.1 - 0.9 x10E3/uL   EOS (ABSOLUTE) 0.3 0.0 - 0.4 x10E3/uL   Basophils Absolute 0.1 0.0 - 0.2 x10E3/uL   Immature Granulocytes 0 Not Estab. %   Immature Grans (Abs) 0.0 0.0 - 0.1 x10E3/uL  Comprehensive metabolic panel   Collection Time: 11/10/22  8:59 AM  Result Value Ref Range   Glucose 90 70 - 99 mg/dL   BUN 12 6 - 24 mg/dL   Creatinine, Ser 0.86 0.57 - 1.00 mg/dL   eGFR 90 >57 QI/ONG/2.95   BUN/Creatinine Ratio 15 9 - 23   Sodium 139 134 - 144 mmol/L   Potassium 4.1 3.5 - 5.2 mmol/L   Chloride 99 96 - 106 mmol/L   CO2 26 20 - 29 mmol/L   Calcium 9.4 8.7 - 10.2 mg/dL   Total Protein 6.6 6.0 - 8.5 g/dL  Albumin 4.6 3.8 - 4.9 g/dL   Globulin, Total 2.0 1.5 - 4.5 g/dL   Bilirubin Total 0.2 0.0 - 1.2 mg/dL   Alkaline Phosphatase 58 44 - 121 IU/L   AST 11 0 - 40 IU/L   ALT 7 0 - 32  IU/L  Lipid Panel w/o Chol/HDL Ratio   Collection Time: 11/10/22  8:59 AM  Result Value Ref Range   Cholesterol, Total 236 (H) 100 - 199 mg/dL   Triglycerides 161 (H) 0 - 149 mg/dL   HDL 66 >09 mg/dL   VLDL Cholesterol Cal 56 (H) 5 - 40 mg/dL   LDL Chol Calc (NIH) 604 (H) 0 - 99 mg/dL  TSH   Collection Time: 11/10/22  8:59 AM  Result Value Ref Range   TSH 3.270 0.450 - 4.500 uIU/mL  VITAMIN D  25 Hydroxy (Vit-D Deficiency, Fractures)   Collection Time: 11/10/22  8:59 AM  Result Value Ref Range   Vit D, 25-Hydroxy 63.1 30.0 - 100.0 ng/mL  Vitamin B12   Collection Time: 11/10/22  8:59 AM  Result Value Ref Range   Vitamin B-12 485 232 - 1,245 pg/mL      Assessment & Plan:   Problem List Items Addressed This Visit       Other   Depression - Primary   Under good control on current regimen. Continue current regimen. Continue to monitor. Call with any concerns. Refills given. Labs drawn today.        Relevant Medications   buPROPion  (WELLBUTRIN  XL) 300 MG 24 hr tablet   escitalopram  (LEXAPRO ) 20 MG tablet   Anxiety   Under good control on current regimen. Continue current regimen. Continue to monitor. Call with any concerns. Refills given. Labs drawn today.       Relevant Medications   buPROPion  (WELLBUTRIN  XL) 300 MG 24 hr tablet   escitalopram  (LEXAPRO ) 20 MG tablet   Vitamin B12 deficiency (non anemic)   Rechecking labs today. Await results. Treat as needed.       Relevant Orders   B12   Hyperlipidemia, mixed   Relevant Orders   Lipid Panel w/o Chol/HDL Ratio   Comprehensive metabolic panel with GFR   Vitamin D  deficiency   Rechecking labs today. Await results. Treat as needed.       Relevant Orders   VITAMIN D  25 Hydroxy (Vit-D Deficiency, Fractures)   History of obesity   Doing great. Continue slow titration off wegovy . Recheck 6 months. Call with any concerns.         Follow up plan: Return in about 6 months (around 12/19/2023) for  physical.

## 2023-06-19 NOTE — Assessment & Plan Note (Signed)
 Doing great. Continue slow titration off wegovy . Recheck 6 months. Call with any concerns.

## 2023-06-20 LAB — COMPREHENSIVE METABOLIC PANEL WITH GFR
ALT: 9 IU/L (ref 0–32)
AST: 12 IU/L (ref 0–40)
Albumin: 4.6 g/dL (ref 3.8–4.9)
Alkaline Phosphatase: 57 IU/L (ref 44–121)
BUN/Creatinine Ratio: 20 (ref 9–23)
BUN: 13 mg/dL (ref 6–24)
Bilirubin Total: 0.2 mg/dL (ref 0.0–1.2)
CO2: 23 mmol/L (ref 20–29)
Calcium: 9.9 mg/dL (ref 8.7–10.2)
Chloride: 100 mmol/L (ref 96–106)
Creatinine, Ser: 0.64 mg/dL (ref 0.57–1.00)
Globulin, Total: 2.1 g/dL (ref 1.5–4.5)
Glucose: 89 mg/dL (ref 70–99)
Potassium: 4.3 mmol/L (ref 3.5–5.2)
Sodium: 139 mmol/L (ref 134–144)
Total Protein: 6.7 g/dL (ref 6.0–8.5)
eGFR: 105 mL/min/{1.73_m2} (ref >59)

## 2023-06-20 LAB — LIPID PANEL W/O CHOL/HDL RATIO
Cholesterol, Total: 238 mg/dL — ABNORMAL HIGH (ref 100–199)
HDL: 75 mg/dL (ref >39)
LDL Chol Calc (NIH): 123 mg/dL — ABNORMAL HIGH (ref 0–99)
Triglycerides: 232 mg/dL — ABNORMAL HIGH (ref 0–149)
VLDL Cholesterol Cal: 40 mg/dL (ref 5–40)

## 2023-06-20 LAB — VITAMIN B12: Vitamin B-12: 438 pg/mL (ref 232–1245)

## 2023-06-20 LAB — VITAMIN D 25 HYDROXY (VIT D DEFICIENCY, FRACTURES): Vit D, 25-Hydroxy: 62.2 ng/mL (ref 30.0–100.0)

## 2023-06-21 ENCOUNTER — Encounter: Payer: Self-pay | Admitting: Family Medicine

## 2023-06-30 ENCOUNTER — Encounter: Payer: Self-pay | Admitting: Family Medicine

## 2023-07-03 MED ORDER — ONDANSETRON HCL 4 MG PO TABS
4.0000 mg | ORAL_TABLET | Freq: Three times a day (TID) | ORAL | 1 refills | Status: DC | PRN
Start: 2023-07-03 — End: 2023-12-21

## 2023-08-21 ENCOUNTER — Other Ambulatory Visit: Payer: Self-pay | Admitting: Family Medicine

## 2023-08-21 DIAGNOSIS — Z1231 Encounter for screening mammogram for malignant neoplasm of breast: Secondary | ICD-10-CM

## 2023-09-08 ENCOUNTER — Ambulatory Visit
Admission: RE | Admit: 2023-09-08 | Discharge: 2023-09-08 | Disposition: A | Discharge status: Home / Self Care | Source: Ambulatory Visit | Attending: Family Medicine | Admitting: Family Medicine

## 2023-09-08 DIAGNOSIS — Z1231 Encounter for screening mammogram for malignant neoplasm of breast: Secondary | ICD-10-CM | POA: Diagnosis present

## 2023-09-15 ENCOUNTER — Ambulatory Visit: Payer: Self-pay | Admitting: Family Medicine

## 2023-09-30 ENCOUNTER — Ambulatory Visit (INDEPENDENT_AMBULATORY_CARE_PROVIDER_SITE_OTHER): Payer: Managed Care, Other (non HMO) | Admitting: Internal Medicine

## 2023-09-30 DIAGNOSIS — J4520 Mild intermittent asthma, uncomplicated: Secondary | ICD-10-CM

## 2023-09-30 LAB — PULMONARY FUNCTION TEST

## 2023-10-12 ENCOUNTER — Other Ambulatory Visit: Payer: Self-pay | Admitting: Physician Assistant

## 2023-10-12 DIAGNOSIS — F5101 Primary insomnia: Secondary | ICD-10-CM

## 2023-10-13 NOTE — Telephone Encounter (Signed)
 Please send her ambien 

## 2023-10-14 NOTE — Telephone Encounter (Signed)
 As per [facility] is ok to send pt next appt 10/20/2023

## 2023-10-15 ENCOUNTER — Other Ambulatory Visit: Payer: Self-pay | Admitting: Internal Medicine

## 2023-10-15 DIAGNOSIS — J4530 Mild persistent asthma, uncomplicated: Secondary | ICD-10-CM

## 2023-10-19 ENCOUNTER — Ambulatory Visit: Payer: Managed Care, Other (non HMO) | Admitting: Physician Assistant

## 2023-10-20 ENCOUNTER — Telehealth: Payer: Self-pay | Admitting: Internal Medicine

## 2023-10-20 ENCOUNTER — Telehealth: Payer: Self-pay | Admitting: Family Medicine

## 2023-10-20 ENCOUNTER — Ambulatory Visit (INDEPENDENT_AMBULATORY_CARE_PROVIDER_SITE_OTHER): Admitting: Internal Medicine

## 2023-10-20 ENCOUNTER — Encounter: Payer: Self-pay | Admitting: Internal Medicine

## 2023-10-20 VITALS — BP 130/70 | HR 69 | Temp 97.8°F | Resp 16 | Ht 68.0 in | Wt 161.0 lb

## 2023-10-20 DIAGNOSIS — J4530 Mild persistent asthma, uncomplicated: Secondary | ICD-10-CM | POA: Diagnosis not present

## 2023-10-20 DIAGNOSIS — G4733 Obstructive sleep apnea (adult) (pediatric): Secondary | ICD-10-CM | POA: Diagnosis not present

## 2023-10-20 DIAGNOSIS — F5101 Primary insomnia: Secondary | ICD-10-CM | POA: Diagnosis not present

## 2023-10-20 DIAGNOSIS — J301 Allergic rhinitis due to pollen: Secondary | ICD-10-CM | POA: Diagnosis not present

## 2023-10-20 MED ORDER — ZOLPIDEM TARTRATE 10 MG PO TABS: ORAL_TABLET | ORAL | 4 refills | Status: AC

## 2023-10-20 MED ORDER — ROFLUMILAST 500 MCG PO TABS: 500.0000 ug | ORAL_TABLET | Freq: Every day | ORAL | 3 refills | Status: DC

## 2023-10-20 MED ORDER — ROFLUMILAST 500 MCG PO TABS: 500.0000 ug | ORAL_TABLET | Freq: Every day | ORAL | 6 refills | Status: AC

## 2023-10-20 NOTE — Patient Instructions (Signed)
 Asthma, Adult  Asthma is a condition that causes swelling and narrowing of the airways. These are the passages that lead from the nose and mouth down into the lungs. When asthma symptoms get worse it is called an asthma attack or flare. This can make it hard to breathe. Asthma flares can range from minor to life-threatening. There is no cure for asthma, but medicines and lifestyle changes can help to control it. What are the causes? It is not known exactly what causes asthma, but certain things can cause asthma symptoms to get worse (triggers). What can trigger an asthma attack? Cigarette smoke. Mold. Dust. Your pet's skin flakes (dander). Cockroaches. Pollen. Air pollution (like household cleaners, wood smoke, smog, or Therapist, occupational). What are the signs or symptoms? Trouble breathing (shortness of breath). Coughing. Making high-pitched whistling sounds when you breathe, most often when you breathe out (wheezing). Chest tightness. Tiredness with little activity. Poor exercise tolerance. How is this treated? Controller medicines that help prevent asthma symptoms. Fast-acting reliever or rescue medicines. These give short-term relief of asthma symptoms. Allergy medicines if your attacks are brought on by allergens. Medicines to help control the body's defense (immune) system. Staying away from the things that cause asthma attacks. Follow these instructions at home: Avoiding triggers in your home Do not allow anyone to smoke in your home. Limit use of fireplaces and wood stoves. Get rid of pests (such as roaches and mice) and their droppings. Keep your home clean. Clean your floors. Dust regularly. Use cleaning products that do not smell. Wash bed sheets and blankets every week in hot water. Dry them in a dryer. Have someone vacuum when you are not home. Change your heating and air conditioning filters often. Use blankets that are made of polyester or cotton. General  instructions Take over-the-counter and prescription medicines only as told by your doctor. Do not smoke or use any products that contain nicotine or tobacco. If you need help quitting, ask your doctor. Stay away from secondhand smoke. Avoid doing things outdoors when allergen counts are high and when air quality is low. Warm up before you exercise. Take time to cool down after exercise. Use a peak flow meter as told by your doctor. A peak flow meter is a tool that measures how well your lungs are working. Keep track of the peak flow meter's readings. Write them down. Follow your asthma action plan. This is a written plan for taking care of your asthma and treating your attacks. Make sure you get all the shots (vaccines) that your doctor recommends. Ask your doctor about a flu shot and a pneumonia shot. Keep all follow-up visits. Contact a doctor if: You have wheezing, shortness of breath, or a cough even while taking medicine to prevent attacks. The mucus you cough up (sputum) is thicker than usual. The mucus you cough up changes from clear or white to yellow, green, gray, or is bloody. You have problems from the medicine you are taking, such as: A rash. Itching. Swelling. Trouble breathing. You need reliever medicines more than 2-3 times a week. Your peak flow reading is still at 50-79% of your personal best after following the action plan for 1 hour. You have a fever. Get help right away if: You seem to be worse and are not responding to medicine during an asthma attack. You are short of breath even at rest. You get short of breath when doing very little activity. You have trouble eating, drinking, or talking. You have chest  pain or tightness. You have a fast heartbeat. Your lips or fingernails start to turn blue. You are light-headed or dizzy, or you faint. Your peak flow is less than 50% of your personal best. You feel too tired to breathe normally. These symptoms may be an  emergency. Get help right away. Call 911. Do not wait to see if the symptoms will go away. Do not drive yourself to the hospital. Summary Asthma is a long-term (chronic) condition in which the airways get tight and narrow. An asthma attack can make it hard to breathe. Asthma cannot be cured, but medicines and lifestyle changes can help control it. Make sure you understand how to avoid triggers and how and when to use your medicines. Avoid things that can cause allergy symptoms (allergens). These include animal skin flakes (dander) and pollen from trees or grass. Avoid things that pollute the air. These may include household cleaners, wood smoke, smog, or chemical odors. This information is not intended to replace advice given to you by your health care provider. Make sure you discuss any questions you have with your health care provider. Document Revised: 11/19/2020 Document Reviewed: 11/19/2020 Elsevier Patient Education  2024 [employer].

## 2023-10-20 NOTE — Telephone Encounter (Signed)
 error

## 2023-10-20 NOTE — Progress Notes (Signed)
 [facility] [address] [place], [place] [address]  Pulmonary Sleep Medicine   Office Visit Note  Patient Name: [name] DOB: [birth year] MRN [id number]  Date of Service: 10/20/2023  Complaints/HPI: She has had PFT and we reviewed this. Patient has normal PFT. She is on inhaler only needs albuterol . She is taking daliresp  and this has helped her to stay free of exacerbation. She is no longer on allergy shots and she has done well. On occasion she needs benadryl but rarely. Denies cough or congestion. Notes some reflux and is on omeprazole  twice daily  Office Spirometry Results:     ROS  General: (-) fever, (-) chills, (-) night sweats, (-) weakness Skin: (-) rashes, (-) itching,. Eyes: (-) visual changes, (-) redness, (-) itching. Nose and Sinuses: (-) nasal stuffiness or itchiness, (-) postnasal drip, (-) nosebleeds, (-) sinus trouble. Mouth and Throat: (-) sore throat, (-) hoarseness. Neck: (-) swollen glands, (-) enlarged thyroid , (-) neck pain. Respiratory: - cough, (-) bloody sputum, - shortness of breath, - wheezing. Cardiovascular: - ankle swelling, (-) chest pain. Lymphatic: (-) lymph node enlargement. Neurologic: (-) numbness, (-) tingling. Psychiatric: (-) anxiety, (-) depression   Current Medication: Outpatient Encounter Medications as of 10/20/2023  Medication Sig   albuterol  (VENTOLIN  HFA) 108 (90 Base) MCG/ACT inhaler Inhale 1-2 puffs into the lungs every 4 (four) hours as needed for wheezing or shortness of breath.   buPROPion  (WELLBUTRIN  XL) 300 MG 24 hr tablet TAKE 1 TABLET DAILY   cetirizine (ZYRTEC) 10 MG tablet Take 10 mg by mouth daily.   cyclobenzaprine  (FLEXERIL ) 10 MG tablet Take 1 tablet (10 mg total) by mouth 3 (three) times daily as needed for muscle spasms.   EPINEPHrine  (EPIPEN  2-PAK) 0.3 mg/0.3 mL IJ SOAJ injection Inject 0.3 mLs (0.3 mg total) into the muscle as needed for anaphylaxis.   escitalopram  (LEXAPRO ) 20 MG tablet Take  1 tablet (20 mg total) by mouth daily.   estradiol  (ESTRACE ) 0.5 MG tablet Take 1 tablet (0.5 mg total) by mouth daily.   LORazepam  (ATIVAN ) 0.5 MG tablet Take 1 tablet (0.5 mg total) by mouth daily as needed for anxiety.   omeprazole  (PRILOSEC) 40 MG capsule Take 1 capsule (40 mg total) by mouth 2 (two) times daily.   ondansetron  (ZOFRAN ) 4 MG tablet Take 1 tablet (4 mg total) by mouth every 8 (eight) hours as needed for nausea or vomiting.   roflumilast  (DALIRESP ) 500 MCG TABS tablet TAKE 1 TABLET BY MOUTH DAILY   Semaglutide -Weight Management (WEGOVY ) 0.5 MG/0.5ML SOAJ Inject 0.5 mg into the skin once a week.   Semaglutide -Weight Management (WEGOVY ) 1 MG/0.5ML SOAJ Inject 1 mg into the skin once a week.   triamcinolone ointment (KENALOG) 0.1 % APPLY TO THE AFFECTED AREA LEG, ARM, AND TRUNK TWICE DAILY. AVOID USE ON FACE   zolpidem  (AMBIEN ) 10 MG tablet TAKE 1 TABLET BY MOUTH DAILY AT BEDTIME AS NEEDED   meloxicam  (MOBIC ) 15 MG tablet Take 1 tablet (15 mg total) by mouth daily. (Patient not taking: Reported on 10/20/2023)   No facility-administered encounter medications on file as of 10/20/2023.    Surgical History: Past Surgical History:  Procedure Laterality Date   APPENDECTOMY     ESOPHAGOGASTRODUODENOSCOPY (EGD) WITH PROPOFOL  N/A 06/14/2019   Procedure: ESOPHAGOGASTRODUODENOSCOPY (EGD) WITH PROPOFOL ;  Surgeon: Jinny Carmine, MD;  Location: [facility] ENDOSCOPY;  Service: Endoscopy;  Laterality: N/A;   OVARIAN CYST DRAINAGE Right 12/25/2001   supracervial hysterectomy     TONSILLECTOMY  TUBAL LIGATION      Medical History: Past Medical History:  Diagnosis Date   Allergic rhinitis    Anxiety    Asthma    Constipation    COPD (chronic obstructive pulmonary disease) (HCC)    Depression    Hematuria    Insomnia    Reflux     Family History: Family History  Problem Relation Age of Onset   Cancer Mother        ovarian   Stroke Father    Cancer Maternal Uncle        lung    Diabetes Maternal Grandmother    Emphysema Maternal Grandfather    Diabetes Paternal Grandmother    Stroke Paternal Grandfather    Breast cancer Neg Hx     Social History: Social History   Socioeconomic History   Marital status: Married    Spouse name: Not on file   Number of children: Not on file   Years of education: Not on file   Highest education level: Not on file  Occupational History   Not on file  Tobacco Use   Smoking status: Never   Smokeless tobacco: Never  Vaping Use   Vaping status: Never Used  Substance and Sexual Activity   Alcohol use: Yes    Comment: occasional   Drug use: No   Sexual activity: Yes  Other Topics Concern   Not on file  Social History Narrative   Not on file   Social Drivers of Health   Financial Resource Strain: Not on file  Food Insecurity: Not on file  Transportation Needs: Not on file  Physical Activity: Not on file  Stress: Not on file  Social Connections: Not on file  Intimate Partner Violence: Not on file    Vital Signs: Blood pressure 130/70, pulse 69, temperature 97.8 F (36.6 C), resp. rate 16, height 5' 8 (1.727 m), weight 161 lb (73 kg), SpO2 98%.  Examination: General Appearance: The patient is well-developed, well-nourished, and in no distress. Skin: Gross inspection of skin unremarkable. Head: normocephalic, no gross deformities. Eyes: no gross deformities noted. ENT: ears appear grossly normal no exudates. Neck: Supple. No thyromegaly. No LAD. Respiratory: no rhonchi noted. Cardiovascular: Normal S1 and S2 without murmur or rub. Extremities: No cyanosis. pulses are equal. Neurologic: Alert and oriented. No involuntary movements.  LABS: No results found for this or any previous visit (from the past 2160 hours).  Radiology: MM 3D SCREENING MAMMOGRAM BILATERAL BREAST Result Date: 09/11/2023 CLINICAL DATA:  Screening. EXAM: DIGITAL SCREENING BILATERAL MAMMOGRAM WITH TOMOSYNTHESIS AND CAD TECHNIQUE: Bilateral  screening digital craniocaudal and mediolateral oblique mammograms were obtained. Bilateral screening digital breast tomosynthesis was performed. The images were evaluated with computer-aided detection. COMPARISON:  Previous exam(s). ACR Breast Density Category b: There are scattered areas of fibroglandular density. FINDINGS: There are no findings suspicious for malignancy. IMPRESSION: No mammographic evidence of malignancy. A result letter of this screening mammogram will be mailed directly to the patient. RECOMMENDATION: Screening mammogram in one year. (Code:SM-B-01Y) BI-RADS CATEGORY  1: Negative. Electronically Signed   By: Inocente Ast M.D.   On: 09/11/2023 13:22    No results found.  No results found.  Assessment and Plan: Patient Active Problem List   Diagnosis Date Noted   History of obesity 04/21/2022   Vitamin D  deficiency 03/29/2021   Tinnitus of both ears 03/29/2021   Hot flashes 01/14/2021   Dysphagia    Stricture and stenosis of esophagus    Palpitations 08/12/2018  Hyperlipidemia, mixed 08/02/2018   Precordial pain 08/02/2018   Family history of ovarian cancer 03/12/2016   Vitamin B12 deficiency (non anemic) 11/18/2014   Constipation    Asthma    Allergic rhinitis    Depression    Anxiety    Hematuria    Insomnia    GERD without esophagitis     1. Mild persistent asthma without complication (Primary) Under good control. Will continuew ith albuterol  as needed  2. Seasonal allergic rhinitis due to pollen PRN anthistamines  3. OSA (obstructive sleep apnea) No longer on CPAP she has lost weight and feels better. Last PSG Home was in 2020   4. Insomnia  will continue with ambien  has done well with it   General Counseling: I have discussed the findings of the evaluation and examination with [name].  I have also discussed any further diagnostic evaluation thatmay be needed or ordered today. [name] verbalizes understanding of the findings of todays visit. We also  reviewed her medications today and discussed drug interactions and side effects including but not limited excessive drowsiness and altered mental states. We also discussed that there is always a risk not just to her but also people around her. she has been encouraged to call the office with any questions or concerns that should arise related to todays visit.  No orders of the defined types were placed in this encounter.    Time spent: 66  I have personally obtained a history, examined the patient, evaluated laboratory and imaging results, formulated the assessment and plan and placed orders.    Kayla DELENA Bathe, MD [facility] Pulmonary and Critical Care Sleep medicine

## 2023-10-20 NOTE — Telephone Encounter (Signed)
 MR emailed to [employer]; [email or web address]

## 2023-10-21 LAB — PULMONARY FUNCTION TEST

## 2023-10-28 ENCOUNTER — Ambulatory Visit (INDEPENDENT_AMBULATORY_CARE_PROVIDER_SITE_OTHER): Admitting: Nurse Practitioner

## 2023-10-28 ENCOUNTER — Ambulatory Visit: Payer: Self-pay | Admitting: Nurse Practitioner

## 2023-10-28 ENCOUNTER — Ambulatory Visit: Payer: Self-pay | Admitting: *Deleted

## 2023-10-28 ENCOUNTER — Ambulatory Visit
Admission: RE | Admit: 2023-10-28 | Discharge: 2023-10-28 | Disposition: A | Discharge status: Home / Self Care | Source: Ambulatory Visit | Attending: Nurse Practitioner | Admitting: Nurse Practitioner

## 2023-10-28 ENCOUNTER — Encounter: Payer: Self-pay | Admitting: Nurse Practitioner

## 2023-10-28 VITALS — BP 102/60 | HR 91 | Temp 98.7°F | Ht 67.0 in | Wt 158.8 lb

## 2023-10-28 DIAGNOSIS — R062 Wheezing: Secondary | ICD-10-CM | POA: Insufficient documentation

## 2023-10-28 MED ORDER — DOXYCYCLINE HYCLATE 100 MG PO TABS
100.0000 mg | ORAL_TABLET | Freq: Two times a day (BID) | ORAL | 0 refills | Status: DC
Start: 2023-10-28 — End: 2023-12-21

## 2023-10-28 MED ORDER — METHYLPREDNISOLONE 4 MG PO TBPK: ORAL_TABLET | ORAL | 0 refills | Status: DC

## 2023-10-28 NOTE — Progress Notes (Signed)
 BP 102/60   Pulse 91   Temp 98.7 F (37.1 C) (Oral)   Ht 5' 7 (1.702 m)   Wt 158 lb 12.8 oz (72 kg)   SpO2 96%   BMI 24.87 kg/m    Subjective:    Patient ID: [name], female    DOB: 19-Oct-1970, 53 y.o.   MRN: [id number]  HPI: [name] is a 53 y.o. female  Chief Complaint  Patient presents with   URI    Patient states she has been having a cough, chest congestion, drainage, and sinus pressure since Saturday.    UPPER RESPIRATORY TRACT INFECTION Worst symptom: symptoms started on Saturday Fever: didn't check her temp but maybe had one or 2 at home.  Feels like she is burning up in the office right now Cough: yes Shortness of breath: yes Wheezing: yes Chest pain: yes, with cough Chest tightness: yes Chest congestion: yes Nasal congestion: started out in her nose but moved down into her chest Runny nose: yes Post nasal drip: yes Sneezing: no Sore throat: scratchy Swollen glands: no Sinus pressure: no Headache: no Face pain: no Toothache: no Ear pain: yes bilateral Ear pressure: no bilateral Eyes red/itching:no Eye drainage/crusting: no  Vomiting: no Rash: no Fatigue: yes Sick contacts: no Strep contacts: no  Context: stable Recurrent sinusitis: no Relief with OTC cold/cough medications: yes  Treatments attempted: pseudoephedrine and cough syrup   Relevant past medical, surgical, family and social history reviewed and updated as indicated. Interim medical history since our last visit reviewed. Allergies and medications reviewed and updated.  Review of Systems  Constitutional:  Positive for fatigue. Negative for fever.  HENT:  Positive for congestion, postnasal drip, rhinorrhea and sore throat. Negative for dental problem, ear pain, sinus pressure, sinus pain and sneezing.   Respiratory:  Positive for cough, shortness of breath and wheezing.   Cardiovascular:  Positive for chest pain.  Gastrointestinal:  Negative for vomiting.  Skin:  Negative  for rash.  Neurological:  Positive for headaches.    Per HPI unless specifically indicated above     Objective:    BP 102/60   Pulse 91   Temp 98.7 F (37.1 C) (Oral)   Ht 5' 7 (1.702 m)   Wt 158 lb 12.8 oz (72 kg)   SpO2 96%   BMI 24.87 kg/m   Wt Readings from Last 3 Encounters:  10/28/23 158 lb 12.8 oz (72 kg)  10/20/23 161 lb (73 kg)  06/19/23 155 lb 3.2 oz (70.4 kg)    Physical Exam Vitals and nursing note reviewed.  Constitutional:      General: She is not in acute distress.    Appearance: Normal appearance. She is normal weight. She is not ill-appearing, toxic-appearing or diaphoretic.  HENT:     Head: Normocephalic.     Right Ear: Tympanic membrane and external ear normal.     Left Ear: Tympanic membrane and external ear normal.     Nose: Congestion and rhinorrhea present.     Mouth/Throat:     Mouth: Mucous membranes are moist.     Pharynx: Oropharynx is clear. Posterior oropharyngeal erythema present. No oropharyngeal exudate.  Eyes:     General:        Right eye: No discharge.        Left eye: No discharge.     Extraocular Movements: Extraocular movements intact.     Conjunctiva/sclera: Conjunctivae normal.     Pupils: Pupils are equal, round, and  reactive to light.  Cardiovascular:     Rate and Rhythm: Normal rate and regular rhythm.     Heart sounds: No murmur heard. Pulmonary:     Effort: Pulmonary effort is normal. No respiratory distress.     Breath sounds: Wheezing present. No rales.  Musculoskeletal:     Cervical back: Normal range of motion and neck supple.  Skin:    General: Skin is warm and dry.     Capillary Refill: Capillary refill takes less than 2 seconds.  Neurological:     General: No focal deficit present.     Mental Status: She is alert and oriented to person, place, and time. Mental status is at baseline.  Psychiatric:        Mood and Affect: Mood normal.        Behavior: Behavior normal.        Thought Content: Thought content  normal.        Judgment: Judgment normal.     Results for orders placed or performed in visit on 10/21/23  Pulmonary Function Test   Collection Time: 10/21/23  8:22 AM  Result Value Ref Range   FEV1     FVC     FEV1/FVC     TLC     DLCO        Assessment & Plan:   Problem List Items Addressed This Visit   None Visit Diagnoses       Wheezing    -  Primary   Doxycyline and medrol  dose pak sent. Complete course of medication. Chest xray ordered to evaluate for pneumonia. Continue with OTC symptom management.   Relevant Orders   DG Chest 2 View        Follow up plan: No follow-ups on file.

## 2023-10-28 NOTE — Telephone Encounter (Signed)
 Copied from CRM [id number]. Topic: Clinical - Red Word Triage >> Oct 28, 2023  8:06 AM Precious C wrote: [facility] that prompted transfer to Nurse Triage: DISCOLORED OR INCREASED MUCOUS   Patient called in due to sore and itchy throat, coughing, stuffy nose, chest congestion, and chest soreness. Reason for Disposition  [1] Sinus pain (not just congestion) AND [2] fever  Answer Assessment - Initial Assessment Questions 1. LOCATION: Where does it hurt?      I'm having sinus congestion and chest congestion.  I'm coughing up dark green mucus.   I have a sore throat too.   It's from the drainage down the back the back of my throat.   2. ONSET: When did the sinus pain start?  (e.g., hours, days)      Sat. night 3. SEVERITY: How bad is the pain?   (Scale 0-10; or none, mild, moderate or severe)     I've gotten worse since Sat.    No headaches now but I did at first. 4. RECURRENT SYMPTOM: Have you ever had sinus problems before? If Yes, ask: When was the last time? and What happened that time?      I usually get a sinus infection when season changes. 5. NASAL CONGESTION: Is the nose blocked? If Yes, ask: Can you open it or must you breathe through your mouth?     It's better.   It's mostly in my chest now 6. NASAL DISCHARGE: Do you have discharge from your nose? If so ask, What color?     Not now 7. FEVER: Do you have a fever? If Yes, ask: What is it, how was it measured, and when did it start?      I was a day or two ago but not now. 8. OTHER SYMPTOMS: Do you have any other symptoms? (e.g., sore throat, cough, earache, difficulty breathing)     Sore throat, post nasal drip, coughing a lot, chest congestion I'm taking Robitussin for cough and chest congestion but it's getting worse. 9. PREGNANCY: Is there any chance you are pregnant? When was your last menstrual period?     N/A due to age  Protocols used: Sinus Pain or Congestion-A-AH FYI Only or Action Required?: FYI  only for provider.  Patient was last seen in primary care on 06/19/2023 by Vicci Duwaine SQUIBB, DO.  Called Nurse Triage reporting URI.  Symptoms began several days ago.  Interventions attempted: OTC medications: Robitussin cough and cold.  Symptoms are: gradually worsening.  Triage Disposition: See Physician Within 24 Hours  Patient/caregiver understands and will follow disposition?: Yes

## 2023-10-30 ENCOUNTER — Telehealth: Payer: Self-pay | Admitting: Family Medicine

## 2023-10-30 NOTE — Telephone Encounter (Signed)
 08/26/225 & PFT emailed to [employer]; [email or web address]

## 2023-11-01 NOTE — Procedures (Signed)
 [facility] [address] [place], [address]    Complete Pulmonary Function Testing Interpretation:  FINDINGS:  The forced vital capacity is normal.  FEV1 is normal.  FEV1 FVC ratio is mildly decreased.  Postbronchodilator there is no significant improvement in FEV1.  Total lung capacity is normal.  Residual volume is normal.  The FRC was normal.  DLCO is normal.  IMPRESSION:  This pulmonary function study is within normal limits clinical correlation recommended.  Elfreda DELENA Bathe, MD [facility] Pulmonary Critical Care Medicine Sleep Medicine

## 2023-11-03 LAB — PULMONARY FUNCTION TEST

## 2023-11-24 ENCOUNTER — Other Ambulatory Visit: Payer: Self-pay | Admitting: Family Medicine

## 2023-11-26 NOTE — Telephone Encounter (Signed)
 Requested Prescriptions  Pending Prescriptions Disp Refills   semaglutide -weight management (WEGOVY ) 0.5 MG/0.5ML SOAJ SQ injection [Pharmacy Med Name: WEGOVY  PEN INJ 0.5ML 4'S 0.5MG ] 6 mL 0    Sig: INJECT 0.5 MG UNDER THE SKIN WEEKLY     Endocrinology:  Diabetes - GLP-1 Receptor Agonists - semaglutide  Failed - 11/26/2023 12:00 PM      Failed - HBA1C in normal range and within 180 days    No results found for: HGBA1C, LABA1C       Passed - Cr in normal range and within 360 days    Creatinine  Date Value Ref Range Status  03/04/2012 0.73 0.60 - 1.30 mg/dL Final   Creatinine, Ser  Date Value Ref Range Status  06/19/2023 0.64 0.57 - 1.00 mg/dL Final         Passed - Valid encounter within last 6 months    Recent Outpatient Visits           4 weeks ago Wheezing   [place] [facility] Melvin Pao, NP   5 months ago Mild episode of recurrent major depressive disorder   [place] [facility] [place], [name], DO   6 months ago Acute pain of left knee   [place] [facility] Melvin Pao, NP

## 2023-12-04 ENCOUNTER — Other Ambulatory Visit: Payer: Self-pay | Admitting: Family Medicine

## 2023-12-07 ENCOUNTER — Encounter: Payer: Self-pay | Admitting: Family Medicine

## 2023-12-07 NOTE — Telephone Encounter (Signed)
 Requested Prescriptions  Pending Prescriptions Disp Refills   buPROPion  (WELLBUTRIN  XL) 300 MG 24 hr tablet [Pharmacy Med Name: BUPROPION  HCL XL TABS 300MG ] 90 tablet 0    Sig: TAKE 1 TABLET DAILY     Psychiatry: Antidepressants - bupropion  Passed - 12/07/2023  4:46 PM      Passed - Cr in normal range and within 360 days    Creatinine  Date Value Ref Range Status  03/04/2012 0.73 0.60 - 1.30 mg/dL Final   Creatinine, Ser  Date Value Ref Range Status  06/19/2023 0.64 0.57 - 1.00 mg/dL Final         Passed - AST in normal range and within 360 days    AST  Date Value Ref Range Status  06/19/2023 12 0 - 40 IU/L Final         Passed - ALT in normal range and within 360 days    ALT  Date Value Ref Range Status  06/19/2023 9 0 - 32 IU/L Final         Passed - Completed PHQ-2 or PHQ-9 in the last 360 days      Passed - Last BP in normal range    BP Readings from Last 1 Encounters:  10/28/23 102/60         Passed - Valid encounter within last 6 months    Recent Outpatient Visits           1 month ago Wheezing   [place] [facility] [place], NP   5 months ago Mild episode of recurrent major depressive disorder   [place] [facility] [place], [name], DO   7 months ago Acute pain of left knee   [place] [facility] Melvin Pao, NP

## 2023-12-08 ENCOUNTER — Other Ambulatory Visit (HOSPITAL_COMMUNITY): Payer: Self-pay

## 2023-12-08 ENCOUNTER — Telehealth: Payer: Self-pay

## 2023-12-08 NOTE — Telephone Encounter (Signed)
 Per test claim, member must reach out to [facility] for coverage, as insurance considers it a plan/benefit exclusion otherwise

## 2023-12-08 NOTE — Telephone Encounter (Signed)
 Pharmacy Patient Advocate Encounter   Received notification from Physician's Office that prior authorization for Wegovy  0.5mg /0.41ml Pen is required/requested.   Insurance verification completed.   The patient is insured through [employer].   Per test claim: Per test claim, medication is not covered due to plan/benefit exclusion, PA not submitted at this time

## 2023-12-11 NOTE — Telephone Encounter (Unsigned)
 Copied from CRM [id number]. Topic: Clinical - Medication Prior Auth >> Dec 11, 2023  2:17 PM Tinnie C wrote: Reason for CRM: FYI- Patient called for update on prior auth. Most recent update relayed. She says she has reached out to [facility] already about a week ago but will reach out again just in case.

## 2023-12-21 ENCOUNTER — Encounter: Payer: Self-pay | Admitting: Family Medicine

## 2023-12-21 ENCOUNTER — Ambulatory Visit (INDEPENDENT_AMBULATORY_CARE_PROVIDER_SITE_OTHER): Admitting: Family Medicine

## 2023-12-21 VITALS — BP 95/60 | HR 69 | Temp 98.0°F | Ht 67.0 in | Wt 164.8 lb

## 2023-12-21 DIAGNOSIS — F419 Anxiety disorder, unspecified: Secondary | ICD-10-CM

## 2023-12-21 DIAGNOSIS — Z1211 Encounter for screening for malignant neoplasm of colon: Secondary | ICD-10-CM

## 2023-12-21 DIAGNOSIS — Z8639 Personal history of other endocrine, nutritional and metabolic disease: Secondary | ICD-10-CM

## 2023-12-21 DIAGNOSIS — E559 Vitamin D deficiency, unspecified: Secondary | ICD-10-CM

## 2023-12-21 DIAGNOSIS — Z0000 Encounter for general adult medical examination without abnormal findings: Secondary | ICD-10-CM | POA: Diagnosis not present

## 2023-12-21 DIAGNOSIS — M542 Cervicalgia: Secondary | ICD-10-CM

## 2023-12-21 DIAGNOSIS — E782 Mixed hyperlipidemia: Secondary | ICD-10-CM | POA: Diagnosis not present

## 2023-12-21 DIAGNOSIS — E538 Deficiency of other specified B group vitamins: Secondary | ICD-10-CM

## 2023-12-21 DIAGNOSIS — F330 Major depressive disorder, recurrent, mild: Secondary | ICD-10-CM | POA: Diagnosis not present

## 2023-12-21 LAB — BAYER DCA HB A1C WAIVED: HB A1C (BAYER DCA - WAIVED): 4.5 % — ABNORMAL LOW (ref 4.8–5.6)

## 2023-12-21 MED ORDER — ESCITALOPRAM OXALATE 20 MG PO TABS: 20.0000 mg | ORAL_TABLET | Freq: Every day | ORAL | 1 refills | Status: AC

## 2023-12-21 MED ORDER — WEGOVY 0.25 MG/0.5ML ~~LOC~~ SOAJ: 0.2500 mg | SUBCUTANEOUS | 1 refills | Status: AC

## 2023-12-21 MED ORDER — ESTRADIOL 0.5 MG PO TABS: 0.5000 mg | ORAL_TABLET | Freq: Every day | ORAL | 2 refills | Status: AC

## 2023-12-21 MED ORDER — ONDANSETRON HCL 4 MG PO TABS: 4.0000 mg | ORAL_TABLET | Freq: Three times a day (TID) | ORAL | 1 refills | Status: AC | PRN

## 2023-12-21 MED ORDER — CYCLOBENZAPRINE HCL 10 MG PO TABS: 10.0000 mg | ORAL_TABLET | Freq: Every day | ORAL | 3 refills | Status: DC

## 2023-12-21 MED ORDER — OMEPRAZOLE 40 MG PO CPDR
40.0000 mg | DELAYED_RELEASE_CAPSULE | Freq: Two times a day (BID) | ORAL | 2 refills | Status: AC
Start: 2023-12-21 — End: ?

## 2023-12-21 MED ORDER — ALBUTEROL SULFATE HFA 108 (90 BASE) MCG/ACT IN AERS: 1.0000 | INHALATION_SPRAY | RESPIRATORY_TRACT | 6 refills | Status: AC | PRN

## 2023-12-21 MED ORDER — BUPROPION HCL ER (XL) 300 MG PO TB24: 300.0000 mg | ORAL_TABLET | Freq: Every day | ORAL | 1 refills | Status: AC

## 2023-12-21 NOTE — Assessment & Plan Note (Signed)
 Under good control on current regimen. Continue current regimen. Continue to monitor. Call with any concerns. Refills given. Labs drawn today.

## 2023-12-21 NOTE — Assessment & Plan Note (Signed)
 Has done great with wegovy  which she has been on for 2.5 years. She has lost and maintained her weight loss. Having issues with insurance- will write appeal to try to get it covered. As she's been off for a month- will start her back at 0.25mg  with goal of continuing that dose for maintenance. Recheck 6 weeks.

## 2023-12-21 NOTE — Progress Notes (Signed)
 BP 95/60   Pulse 69   Temp 98 F (36.7 C) (Oral)   Ht 5' 7 (1.702 m)   Wt 164 lb 12.8 oz (74.8 kg)   SpO2 98%   BMI 25.81 kg/m    Subjective:    Patient ID: [name], female    DOB: 08-Apr-1970, 53 y.o.   MRN: [id number]  HPI: [name] is a 53 y.o. female presenting on 12/21/2023 for comprehensive medical examination. Current medical complaints include:  NECK PAIN  Location:Left Duration:2 months Severity: moderate Quality: ache Frequency: constant Radiation: L arm Aggravating factors: working on the computer Alleviating factors: deep tissue massage, salonpas, ibuprofen Relief with NSAIDs?:  mild Weakness:  no Paresthesias / decreased sensation:  yes  Fevers:  no  HYPERLIPIDEMIA Hyperlipidemia status: stable Satisfied with current treatment?  yes Side effects:  not on anything Medication compliance: N/A Past cholesterol meds: none Supplements: none Aspirin:  no The 10-year ASCVD risk score (Arnett DK, et al., 2019) is: 1%   Values used to calculate the score:     Age: 34 years     Clincally relevant sex: Female     Is Non-Hispanic African American: No     Diabetic: No     Tobacco smoker: No     Systolic Blood Pressure: 95 mmHg     Is BP treated: No     HDL Cholesterol: 75 mg/dL     Total Cholesterol: 238 mg/dL Chest pain:  no Coronary artery disease:  no  ANXIETY/DEPRESSION Duration: chronic Status:controlled Anxious mood: no  Excessive worrying: no Irritability: no  Sweating: no Nausea: no Palpitations:no Hyperventilation: no Panic attacks: no Agoraphobia: no  Obscessions/compulsions: no Depressed mood: no    12/21/2023    9:55 AM 10/28/2023    9:06 AM 05/06/2023    8:20 AM 03/13/2023    1:39 PM 11/10/2022    8:42 AM  Depression screen PHQ 2/9  Decreased Interest 0 0 0 0 1  Down, Depressed, Hopeless 0 0 0 0 0  PHQ - 2 Score 0 0 0 0 1  Altered sleeping 0 0 0 1 2  Tired, decreased energy 0 0 0 1 2  Change in appetite 0 0 0 0 0   Feeling bad or failure about yourself  0 0 0 0 0  Trouble concentrating 0 0 0 0 0  Moving slowly or fidgety/restless 0 0 0 0 0  Suicidal thoughts 0 0 0 0 0  PHQ-9 Score 0 0 0 2 5  Difficult doing work/chores  Not difficult at all   Somewhat difficult   Anhedonia: no Weight changes: no Insomnia: no   Hypersomnia: no Fatigue/loss of energy: no Feelings of worthlessness: no Feelings of guilt: no Impaired concentration/indecisiveness: no Suicidal ideations: no  Crying spells: no Recent Stressors/Life Changes: no   Relationship problems: no   Family stress: no     Financial stress: no    Job stress: no    Recent death/loss: no  History of Obesity- has been off her wegovy  for about a month due to insurance issues Duration: chronic Previous attempts at weight loss: yes Complications of obesity: depression, GERD, HLD, OSA Peak weight: 192 Weight loss goal: to maintain  Weight loss to date: 28lbs, max 45lbs on wegovy  Requesting obesity pharmacotherapy: yes Current weight loss supplements/medications: yes Previous weight loss supplements/meds: no  She currently lives with: husband Menopausal Symptoms: no  Depression Screen done today and results listed below:     12/21/2023  9:55 AM 10/28/2023    9:06 AM 05/06/2023    8:20 AM 03/13/2023    1:39 PM 11/10/2022    8:42 AM  Depression screen PHQ 2/9  Decreased Interest 0 0 0 0 1  Down, Depressed, Hopeless 0 0 0 0 0  PHQ - 2 Score 0 0 0 0 1  Altered sleeping 0 0 0 1 2  Tired, decreased energy 0 0 0 1 2  Change in appetite 0 0 0 0 0  Feeling bad or failure about yourself  0 0 0 0 0  Trouble concentrating 0 0 0 0 0  Moving slowly or fidgety/restless 0 0 0 0 0  Suicidal thoughts 0 0 0 0 0  PHQ-9 Score 0 0 0 2 5  Difficult doing work/chores  Not difficult at all   Somewhat difficult    Past Medical History:  Past Medical History:  Diagnosis Date   Allergic rhinitis    Anxiety    Asthma    Constipation    COPD (chronic  obstructive pulmonary disease) (HCC)    Depression    Hematuria    Insomnia    Reflux     Surgical History:  Past Surgical History:  Procedure Laterality Date   APPENDECTOMY     ESOPHAGOGASTRODUODENOSCOPY (EGD) WITH PROPOFOL  N/A 06/14/2019   Procedure: ESOPHAGOGASTRODUODENOSCOPY (EGD) WITH PROPOFOL ;  Surgeon: Jinny Carmine, MD;  Location: [facility] ENDOSCOPY;  Service: Endoscopy;  Laterality: N/A;   OVARIAN CYST DRAINAGE Right 12/25/2001   supracervial hysterectomy     TONSILLECTOMY     TUBAL LIGATION      Medications:  Current Outpatient Medications on File Prior to Visit  Medication Sig   EPINEPHrine  (EPIPEN  2-PAK) 0.3 mg/0.3 mL IJ SOAJ injection Inject 0.3 mLs (0.3 mg total) into the muscle as needed for anaphylaxis.   LORazepam  (ATIVAN ) 0.5 MG tablet Take 1 tablet (0.5 mg total) by mouth daily as needed for anxiety.   roflumilast  (DALIRESP ) 500 MCG TABS tablet Take 1 tablet (500 mcg total) by mouth daily.   zolpidem  (AMBIEN ) 10 MG tablet TAKE 1 TABLET BY MOUTH DAILY AT BEDTIME AS NEEDED   cetirizine (ZYRTEC) 10 MG tablet Take 10 mg by mouth daily.   No current facility-administered medications on file prior to visit.    Allergies:  Allergies  Allergen Reactions   Penicillins Anaphylaxis   Sulfa Antibiotics Other (See Comments)    Bad taste in mouth    Social History:  Social History   Socioeconomic History   Marital status: Married    Spouse name: Not on file   Number of children: Not on file   Years of education: Not on file   Highest education level: Not on file  Occupational History   Not on file  Tobacco Use   Smoking status: Never   Smokeless tobacco: Never  Vaping Use   Vaping status: Never Used  Substance and Sexual Activity   Alcohol use: Yes    Comment: occasional   Drug use: No   Sexual activity: Yes  Other Topics Concern   Not on file  Social History Narrative   Not on file   Social Drivers of Health   Financial Resource Strain: Low Risk   (12/21/2023)   Overall Financial Resource Strain (CARDIA)    Difficulty of Paying Living Expenses: Not hard at all  Food Insecurity: No Food Insecurity (12/21/2023)   Hunger Vital Sign    Worried About Running Out of Food in the Last Year: Never  true    Ran Out of Food in the Last Year: Never true  Transportation Needs: No Transportation Needs (12/21/2023)   PRAPARE - Administrator, Civil Service (Medical): No    Lack of Transportation (Non-Medical): No  Physical Activity: Sufficiently Active (12/21/2023)   Exercise Vital Sign    Days of Exercise per Week: 3 days    Minutes of Exercise per Session: 60 min  Stress: No Stress Concern Present (12/21/2023)   [employer] of Occupational Health - Occupational Stress Questionnaire    Feeling of Stress: Not at all  Social Connections: Moderately Integrated (12/21/2023)   Social Connection and Isolation Panel    Frequency of Communication with Friends and Family: More than three times a week    Frequency of Social Gatherings with Friends and Family: More than three times a week    Attends Religious Services: More than 4 times per year    Active Member of [employer] or Organizations: No    Attends Banker Meetings: Never    Marital Status: Married  Catering Manager Violence: Not At Risk (12/21/2023)   Humiliation, Afraid, Rape, and Kick questionnaire    Fear of Current or Ex-Partner: No    Emotionally Abused: No    Physically Abused: No    Sexually Abused: No   Social History   Tobacco Use  Smoking Status Never  Smokeless Tobacco Never   Social History   Substance and Sexual Activity  Alcohol Use Yes   Comment: occasional    Family History:  Family History  Problem Relation Age of Onset   Cancer Mother        ovarian   Stroke Father    Cancer Maternal Uncle        lung   Diabetes Maternal Grandmother    Emphysema Maternal Grandfather    Diabetes Paternal Grandmother    Stroke Paternal  Grandfather    Breast cancer Neg Hx     Past medical history, surgical history, medications, allergies, family history and social history reviewed with patient today and changes made to appropriate areas of the chart.   Review of Systems  Constitutional: Negative.   HENT: Negative.    Eyes: Negative.   Respiratory: Negative.    Cardiovascular: Negative.   Gastrointestinal: Negative.   Genitourinary: Negative.   Musculoskeletal:  Positive for myalgias and neck pain. Negative for back pain, falls and joint pain.  Skin:  Positive for itching. Negative for rash.  Neurological: Negative.   Endo/Heme/Allergies: Negative.   Psychiatric/Behavioral: Negative.     All other ROS negative except what is listed above and in the HPI.      Objective:    BP 95/60   Pulse 69   Temp 98 F (36.7 C) (Oral)   Ht 5' 7 (1.702 m)   Wt 164 lb 12.8 oz (74.8 kg)   SpO2 98%   BMI 25.81 kg/m   Wt Readings from Last 3 Encounters:  12/21/23 164 lb 12.8 oz (74.8 kg)  10/28/23 158 lb 12.8 oz (72 kg)  10/20/23 161 lb (73 kg)    Physical Exam Vitals and nursing note reviewed.  Constitutional:      General: She is not in acute distress.    Appearance: Normal appearance. She is not ill-appearing, toxic-appearing or diaphoretic.  HENT:     Head: Normocephalic and atraumatic.     Right Ear: Tympanic membrane, ear canal and external ear normal. There is no impacted cerumen.  Left Ear: Tympanic membrane, ear canal and external ear normal. There is no impacted cerumen.     Nose: Nose normal. No congestion or rhinorrhea.     Mouth/Throat:     Mouth: Mucous membranes are moist.     Pharynx: Oropharynx is clear. No oropharyngeal exudate or posterior oropharyngeal erythema.  Eyes:     General: No scleral icterus.       Right eye: No discharge.        Left eye: No discharge.     Extraocular Movements: Extraocular movements intact.     Conjunctiva/sclera: Conjunctivae normal.     Pupils: Pupils are  equal, round, and reactive to light.  Neck:     Vascular: No carotid bruit.  Cardiovascular:     Rate and Rhythm: Normal rate and regular rhythm.     Pulses: Normal pulses.     Heart sounds: No murmur heard.    No friction rub. No gallop.  Pulmonary:     Effort: Pulmonary effort is normal. No respiratory distress.     Breath sounds: Normal breath sounds. No stridor. No wheezing, rhonchi or rales.  Chest:     Chest wall: No tenderness.  Abdominal:     General: Abdomen is flat. Bowel sounds are normal. There is no distension.     Palpations: Abdomen is soft. There is no mass.     Tenderness: There is no abdominal tenderness. There is no right CVA tenderness, left CVA tenderness, guarding or rebound.     Hernia: No hernia is present.  Genitourinary:    Comments: Breast and pelvic exams deferred with shared decision making Musculoskeletal:        General: No swelling, tenderness, deformity or signs of injury.     Cervical back: Normal range of motion and neck supple. No rigidity. No muscular tenderness.     Right lower leg: No edema.     Left lower leg: No edema.  Lymphadenopathy:     Cervical: No cervical adenopathy.  Skin:    General: Skin is warm and dry.     Capillary Refill: Capillary refill takes less than 2 seconds.     Coloration: Skin is not jaundiced or pale.     Findings: No bruising, erythema, lesion or rash.  Neurological:     General: No focal deficit present.     Mental Status: She is alert and oriented to person, place, and time. Mental status is at baseline.     Cranial Nerves: No cranial nerve deficit.     Sensory: No sensory deficit.     Motor: No weakness.     Coordination: Coordination normal.     Gait: Gait normal.     Deep Tendon Reflexes: Reflexes normal.  Psychiatric:        Mood and Affect: Mood normal.        Behavior: Behavior normal.        Thought Content: Thought content normal.        Judgment: Judgment normal.     Results for orders placed  or performed in visit on 12/21/23  Bayer DCA Hb A1c Waived   Collection Time: 12/21/23 10:10 AM  Result Value Ref Range   HB A1C (BAYER DCA - WAIVED) 4.5 (L) 4.8 - 5.6 %      Assessment & Plan:   Problem List Items Addressed This Visit       Other   Depression   Under good control on current regimen. Continue current regimen. Continue  to monitor. Call with any concerns. Refills given. Labs drawn today.        Relevant Medications   buPROPion  (WELLBUTRIN  XL) 300 MG 24 hr tablet   escitalopram  (LEXAPRO ) 20 MG tablet   Anxiety   Under good control on current regimen. Continue current regimen. Continue to monitor. Call with any concerns. Refills given.        Relevant Medications   buPROPion  (WELLBUTRIN  XL) 300 MG 24 hr tablet   escitalopram  (LEXAPRO ) 20 MG tablet   Vitamin B12 deficiency (non anemic)   Rechecking labs today. Await results. Treat as needed.       Relevant Orders   B12   Hyperlipidemia, mixed   Rechecking labs today. Await results. Treat as needed.       Vitamin D  deficiency   Rechecking labs today. Await results. Treat as needed.       Relevant Orders   VITAMIN D  25 Hydroxy (Vit-D Deficiency, Fractures)   History of obesity   Has done great with wegovy  which she has been on for 2.5 years. She has lost and maintained her weight loss. Having issues with insurance- will write appeal to try to get it covered. As she's been off for a month- will start her back at 0.25mg  with goal of continuing that dose for maintenance. Recheck 6 weeks.       Other Visit Diagnoses       Routine general medical examination at a health care facility    -  Primary   Vaccines declined/up to date. Screening labs checked today. Pap and mammo up to date. Cologuard ordered today. Continue diet and exercise. Call w/ any concerns.   Relevant Orders   CBC with Differential/Platelet   Comprehensive metabolic panel with GFR   Lipid Panel w/o Chol/HDL Ratio   TSH   Hepatitis B  surface antibody,quantitative   Bayer DCA Hb A1c Waived (Completed)     Neck pain       Will obtain x-rays and start stretches and flexeril . Call if not getting better or getting worse.   Relevant Orders   DG Cervical Spine Complete     Screening for colon cancer       Cologuard ordered today.   Relevant Orders   Cologuard        Follow up plan: Return in about 6 weeks (around 02/01/2024) for virtual OK.   LABORATORY TESTING:  - Pap smear: up to date  IMMUNIZATIONS:   - Tdap: Tetanus vaccination status reviewed: last tetanus booster within 10 years. - Influenza: Refused - Pneumovax: Refused - Prevnar: Refused - COVID: Refused - HPV: Not applicable - Shingrix vaccine: Refused  SCREENING: -Mammogram: Up to date  - Colonoscopy: Ordered today    PATIENT COUNSELING:   Advised to take 1 mg of folate supplement per day if capable of pregnancy.   Sexuality: Discussed sexually transmitted diseases, partner selection, use of condoms, avoidance of unintended pregnancy  and contraceptive alternatives.   Advised to avoid cigarette smoking.  I discussed with the patient that most people either abstain from alcohol or drink within safe limits (<=14/week and <=4 drinks/occasion for males, <=7/weeks and <= 3 drinks/occasion for females) and that the risk for alcohol disorders and other health effects rises proportionally with the number of drinks per week and how often a drinker exceeds daily limits.  Discussed cessation/primary prevention of drug use and availability of treatment for abuse.   Diet: Encouraged to adjust caloric intake to maintain  or achieve  ideal body weight, to reduce intake of dietary saturated fat and total fat, to limit sodium intake by avoiding high sodium foods and not adding table salt, and to maintain adequate dietary potassium and calcium preferably from fresh fruits, vegetables, and low-fat dairy products.    stressed the importance of regular  exercise  Injury prevention: Discussed safety belts, safety helmets, smoke detector, smoking near bedding or upholstery.   Dental health: Discussed importance of regular tooth brushing, flossing, and dental visits.    NEXT PREVENTATIVE PHYSICAL DUE IN 1 YEAR. Return in about 6 weeks (around 02/01/2024) for virtual OK.

## 2023-12-21 NOTE — Assessment & Plan Note (Signed)
 Rechecking labs today. Await results. Treat as needed.

## 2023-12-21 NOTE — Assessment & Plan Note (Signed)
 Under good control on current regimen. Continue current regimen. Continue to monitor. Call with any concerns. Refills given.

## 2023-12-22 ENCOUNTER — Ambulatory Visit: Payer: Self-pay | Admitting: Family Medicine

## 2023-12-22 ENCOUNTER — Other Ambulatory Visit: Payer: Self-pay | Admitting: Family Medicine

## 2023-12-22 LAB — COMPREHENSIVE METABOLIC PANEL WITH GFR
ALT: 9 IU/L (ref 0–32)
AST: 13 IU/L (ref 0–40)
Albumin: 4.5 g/dL (ref 3.8–4.9)
Alkaline Phosphatase: 56 IU/L (ref 49–135)
BUN/Creatinine Ratio: 18 (ref 9–23)
BUN: 13 mg/dL (ref 6–24)
Bilirubin Total: 0.2 mg/dL (ref 0.0–1.2)
CO2: 24 mmol/L (ref 20–29)
Calcium: 9.9 mg/dL (ref 8.7–10.2)
Chloride: 101 mmol/L (ref 96–106)
Creatinine, Ser: 0.72 mg/dL (ref 0.57–1.00)
Globulin, Total: 2.2 g/dL (ref 1.5–4.5)
Glucose: 117 mg/dL — ABNORMAL HIGH (ref 70–99)
Potassium: 3.9 mmol/L (ref 3.5–5.2)
Sodium: 140 mmol/L (ref 134–144)
Total Protein: 6.7 g/dL (ref 6.0–8.5)
eGFR: 99 mL/min/1.73 (ref >59)

## 2023-12-22 LAB — LIPID PANEL W/O CHOL/HDL RATIO
Cholesterol, Total: 230 mg/dL — ABNORMAL HIGH (ref 100–199)
HDL: 79 mg/dL (ref >39)
LDL Chol Calc (NIH): 111 mg/dL — ABNORMAL HIGH (ref 0–99)
Triglycerides: 236 mg/dL — ABNORMAL HIGH (ref 0–149)
VLDL Cholesterol Cal: 40 mg/dL (ref 5–40)

## 2023-12-22 LAB — CBC WITH DIFFERENTIAL/PLATELET
Basophils Absolute: 0.1 x10E3/uL (ref 0.0–0.2)
Basos: 2 %
EOS (ABSOLUTE): 0.2 x10E3/uL (ref 0.0–0.4)
Eos: 5 %
Hematocrit: 38 % (ref 34.0–46.6)
Hemoglobin: 12.2 g/dL (ref 11.1–15.9)
Immature Grans (Abs): 0 x10E3/uL (ref 0.0–0.1)
Immature Granulocytes: 0 %
Lymphocytes Absolute: 2.2 x10E3/uL (ref 0.7–3.1)
Lymphs: 46 %
MCH: 30.7 pg (ref 26.6–33.0)
MCHC: 32.1 g/dL (ref 31.5–35.7)
MCV: 96 fL (ref 79–97)
Monocytes Absolute: 0.3 x10E3/uL (ref 0.1–0.9)
Monocytes: 7 %
Neutrophils Absolute: 1.9 x10E3/uL (ref 1.4–7.0)
Neutrophils: 40 %
Platelets: 250 x10E3/uL (ref 150–450)
RBC: 3.97 x10E6/uL (ref 3.77–5.28)
RDW: 12.1 % (ref 11.7–15.4)
WBC: 4.7 x10E3/uL (ref 3.4–10.8)

## 2023-12-22 LAB — TSH: TSH: 1.91 u[IU]/mL (ref 0.450–4.500)

## 2023-12-22 LAB — HEPATITIS B SURFACE ANTIBODY, QUANTITATIVE: Hepatitis B Surf Ab Quant: <3.5 m[IU]/mL — ABNORMAL LOW

## 2023-12-22 LAB — VITAMIN D 25 HYDROXY (VIT D DEFICIENCY, FRACTURES): Vit D, 25-Hydroxy: 56.6 ng/mL (ref 30.0–100.0)

## 2023-12-22 LAB — VITAMIN B12: Vitamin B-12: 413 pg/mL (ref 232–1245)

## 2023-12-22 MED ORDER — CYCLOBENZAPRINE HCL 10 MG PO TABS: 10.0000 mg | ORAL_TABLET | Freq: Every day | ORAL | 3 refills | Status: DC

## 2023-12-22 MED ORDER — CYCLOBENZAPRINE HCL 10 MG PO TABS: 10.0000 mg | ORAL_TABLET | Freq: Every day | ORAL | 3 refills | Status: AC

## 2023-12-28 ENCOUNTER — Other Ambulatory Visit (HOSPITAL_COMMUNITY): Payer: Self-pay

## 2023-12-29 ENCOUNTER — Other Ambulatory Visit (HOSPITAL_COMMUNITY): Payer: Self-pay

## 2023-12-30 ENCOUNTER — Telehealth: Payer: Self-pay | Admitting: Family Medicine

## 2023-12-30 NOTE — Telephone Encounter (Signed)
 PA team- can you check into this please?

## 2023-12-30 NOTE — Telephone Encounter (Signed)
 Copied from CRM [id number]. Topic: Clinical - Prescription Issue >> Dec 30, 2023 12:33 PM Delon T wrote: Reason for CRM: semaglutide -weight management (WEGOVY ) 0.25 MG/0.5ML SOAJ SQ injection- insurance wants additional information- [phone]

## 2023-12-31 ENCOUNTER — Other Ambulatory Visit (HOSPITAL_COMMUNITY): Payer: Self-pay

## 2023-12-31 NOTE — Telephone Encounter (Signed)
 PA request has been Received. New Encounter has been or will be created for follow up. For additional info see Pharmacy Prior Auth telephone encounter from 12/31/23.

## 2024-01-01 ENCOUNTER — Other Ambulatory Visit (HOSPITAL_COMMUNITY): Payer: Self-pay

## 2024-01-01 ENCOUNTER — Telehealth: Payer: Self-pay

## 2024-01-01 NOTE — Telephone Encounter (Signed)
 Pharmacy Patient Advocate Encounter   Received notification from Pt Calls Messages that prior authorization for Wegovy  0.25MG /0.5ML auto-injectors is required/requested.   Insurance verification completed.   The patient is insured through [employer].   Per test claim: PA required; PA submitted to above mentioned insurance via Latent Key/confirmation #/[id number] Status is pending

## 2024-01-04 NOTE — Telephone Encounter (Signed)
 Pharmacy Patient Advocate Encounter  Received notification from [facility] that Prior Authorization for Wegovy  has been APPROVED from 01/01/2024 to 12/31/2024   PA #/Case ID/Reference #: [id number]

## 2024-01-31 ENCOUNTER — Other Ambulatory Visit: Payer: Self-pay | Admitting: Pediatrics

## 2024-01-31 DIAGNOSIS — M545 Low back pain, unspecified: Secondary | ICD-10-CM

## 2024-02-10 ENCOUNTER — Ambulatory Visit

## 2024-03-01 DIAGNOSIS — H93A3 Pulsatile tinnitus, bilateral: Secondary | ICD-10-CM

## 2024-03-09 ENCOUNTER — Other Ambulatory Visit

## 2024-03-09 ENCOUNTER — Inpatient Hospital Stay: Admission: RE | Admit: 2024-03-09 | Discharge: 2024-03-09 | Attending: Student

## 2024-03-09 DIAGNOSIS — H93A3 Pulsatile tinnitus, bilateral: Secondary | ICD-10-CM

## 2024-03-09 MED ORDER — GADOPICLENOL 0.5 MMOL/ML IV SOLN
10.0000 mL | Freq: Once | INTRAVENOUS | Status: AC | PRN
  Administered 2024-03-09: 8 mL via INTRAVENOUS

## 2024-03-11 ENCOUNTER — Encounter: Payer: Self-pay | Admitting: Family Medicine

## 2024-03-28 ENCOUNTER — Ambulatory Visit: Admitting: Internal Medicine

## 2024-04-04 ENCOUNTER — Ambulatory Visit: Admitting: Internal Medicine

## 2024-06-20 ENCOUNTER — Ambulatory Visit: Admitting: Family Medicine

## 2024-06-21 ENCOUNTER — Ambulatory Visit: Admitting: Family Medicine

## 2024-10-12 ENCOUNTER — Encounter: Admitting: Internal Medicine
